# Patient Record
Sex: Female | Born: 1935 | Race: White | Hispanic: No | Smoking: Never smoker
Health system: Southern US, Community
[De-identification: ages and names within clinical notes are randomized; demographics above are authoritative.]

## PROBLEM LIST (undated history)

## (undated) DIAGNOSIS — E785 Hyperlipidemia, unspecified: Secondary | ICD-10-CM

## (undated) DIAGNOSIS — M79609 Pain in unspecified limb: Secondary | ICD-10-CM

## (undated) DIAGNOSIS — N766 Ulceration of vulva: Secondary | ICD-10-CM

## (undated) DIAGNOSIS — K648 Other hemorrhoids: Secondary | ICD-10-CM

## (undated) DIAGNOSIS — M1990 Unspecified osteoarthritis, unspecified site: Secondary | ICD-10-CM

## (undated) DIAGNOSIS — R609 Edema, unspecified: Secondary | ICD-10-CM

## (undated) DIAGNOSIS — M810 Age-related osteoporosis without current pathological fracture: Secondary | ICD-10-CM

## (undated) DIAGNOSIS — J309 Allergic rhinitis, unspecified: Secondary | ICD-10-CM

## (undated) DIAGNOSIS — Z1231 Encounter for screening mammogram for malignant neoplasm of breast: Secondary | ICD-10-CM

## (undated) DIAGNOSIS — Z85038 Personal history of other malignant neoplasm of large intestine: Secondary | ICD-10-CM

## (undated) DIAGNOSIS — Z13820 Encounter for screening for osteoporosis: Secondary | ICD-10-CM

## (undated) DIAGNOSIS — C20 Malignant neoplasm of rectum: Secondary | ICD-10-CM

## (undated) DIAGNOSIS — F4389 Other reactions to severe stress: Secondary | ICD-10-CM

## (undated) DIAGNOSIS — I10 Essential (primary) hypertension: Secondary | ICD-10-CM

## (undated) DIAGNOSIS — F438 Other reactions to severe stress: Secondary | ICD-10-CM

## (undated) DIAGNOSIS — R197 Diarrhea, unspecified: Secondary | ICD-10-CM

## (undated) HISTORY — DX: Personal history of other malignant neoplasm of large intestine: Z85.038

## (undated) HISTORY — DX: Age-related osteoporosis without current pathological fracture: M81.0

## (undated) HISTORY — DX: Diarrhea, unspecified: R19.7

## (undated) HISTORY — DX: Encounter for screening for osteoporosis: Z13.820

## (undated) HISTORY — DX: Ulceration of vulva: N76.6

## (undated) HISTORY — DX: Pain in unspecified limb: M79.609

## (undated) HISTORY — DX: Unspecified osteoarthritis, unspecified site: M19.90

## (undated) HISTORY — DX: Other reactions to severe stress: F43.89

## (undated) HISTORY — DX: Essential (primary) hypertension: I10

## (undated) HISTORY — DX: Allergic rhinitis, unspecified: J30.9

## (undated) HISTORY — DX: Hyperlipidemia, unspecified: E78.5

## (undated) HISTORY — DX: Edema, unspecified: R60.9

## (undated) HISTORY — DX: Other reactions to severe stress: F43.8

## (undated) HISTORY — DX: Other hemorrhoids: K64.8

## (undated) HISTORY — DX: Malignant neoplasm of rectum: C20

## (undated) HISTORY — DX: Encounter for screening mammogram for malignant neoplasm of breast: Z12.31

## (undated) HISTORY — PX: OTHER SURGICAL HISTORY: SHX169

---

## 1999-01-08 ENCOUNTER — Other Ambulatory Visit: Admission: RE | Admit: 1999-01-08 | Discharge: 1999-01-08 | Payer: Self-pay | Admitting: Family Medicine

## 2001-12-28 ENCOUNTER — Other Ambulatory Visit: Admission: RE | Admit: 2001-12-28 | Discharge: 2001-12-28 | Payer: Self-pay | Admitting: Family Medicine

## 2002-01-10 ENCOUNTER — Encounter: Payer: Self-pay | Admitting: Family Medicine

## 2002-01-10 ENCOUNTER — Observation Stay (HOSPITAL_COMMUNITY): Admission: AD | Admit: 2002-01-10 | Discharge: 2002-01-11 | Payer: Self-pay | Admitting: Family Medicine

## 2002-01-11 ENCOUNTER — Encounter: Payer: Self-pay | Admitting: Family Medicine

## 2002-01-18 ENCOUNTER — Encounter: Payer: Self-pay | Admitting: Family Medicine

## 2002-01-18 ENCOUNTER — Encounter (INDEPENDENT_AMBULATORY_CARE_PROVIDER_SITE_OTHER): Payer: Self-pay | Admitting: *Deleted

## 2002-01-18 ENCOUNTER — Encounter: Admission: RE | Admit: 2002-01-18 | Discharge: 2002-01-18 | Payer: Self-pay | Admitting: Family Medicine

## 2002-03-02 ENCOUNTER — Encounter: Payer: Self-pay | Admitting: Internal Medicine

## 2003-03-20 ENCOUNTER — Encounter: Payer: Self-pay | Admitting: Family Medicine

## 2004-10-25 ENCOUNTER — Encounter: Payer: Self-pay | Admitting: Family Medicine

## 2005-04-23 ENCOUNTER — Ambulatory Visit: Payer: Self-pay | Admitting: Family Medicine

## 2005-06-23 ENCOUNTER — Ambulatory Visit: Payer: Self-pay | Admitting: Family Medicine

## 2005-06-23 ENCOUNTER — Other Ambulatory Visit: Admission: RE | Admit: 2005-06-23 | Discharge: 2005-06-23 | Payer: Self-pay | Admitting: Family Medicine

## 2005-11-01 ENCOUNTER — Ambulatory Visit: Payer: Self-pay | Admitting: Family Medicine

## 2006-04-25 ENCOUNTER — Ambulatory Visit: Payer: Self-pay | Admitting: Family Medicine

## 2006-09-08 ENCOUNTER — Ambulatory Visit: Payer: Self-pay | Admitting: Family Medicine

## 2006-10-28 LAB — FECAL OCCULT BLOOD, GUAIAC: Fecal Occult Blood: NEGATIVE

## 2006-11-09 ENCOUNTER — Ambulatory Visit: Payer: Self-pay | Admitting: Family Medicine

## 2006-11-16 ENCOUNTER — Ambulatory Visit: Payer: Self-pay | Admitting: Family Medicine

## 2006-11-16 LAB — CONVERTED CEMR LAB
AST: 19 units/L (ref 0–37)
Alkaline Phosphatase: 64 units/L (ref 39–117)
BUN: 16 mg/dL (ref 6–23)
Cholesterol: 205 mg/dL (ref 0–200)
Creatinine, Ser: 0.7 mg/dL (ref 0.4–1.2)
GFR calc Af Amer: 106 mL/min
GFR calc non Af Amer: 88 mL/min
Glucose, Bld: 100 mg/dL — ABNORMAL HIGH (ref 70–99)
Sodium: 141 meq/L (ref 135–145)
Total Bilirubin: 0.8 mg/dL (ref 0.3–1.2)
Total CHOL/HDL Ratio: 2.7
Triglycerides: 42 mg/dL (ref 0–149)
VLDL: 8 mg/dL (ref 0–40)

## 2006-11-29 ENCOUNTER — Ambulatory Visit: Payer: Self-pay | Admitting: Family Medicine

## 2007-05-30 ENCOUNTER — Encounter: Payer: Self-pay | Admitting: Family Medicine

## 2007-05-30 DIAGNOSIS — M170 Bilateral primary osteoarthritis of knee: Secondary | ICD-10-CM | POA: Insufficient documentation

## 2007-05-30 DIAGNOSIS — Z9189 Other specified personal risk factors, not elsewhere classified: Secondary | ICD-10-CM | POA: Insufficient documentation

## 2007-05-30 DIAGNOSIS — M1990 Unspecified osteoarthritis, unspecified site: Secondary | ICD-10-CM | POA: Insufficient documentation

## 2007-05-30 DIAGNOSIS — E785 Hyperlipidemia, unspecified: Secondary | ICD-10-CM | POA: Insufficient documentation

## 2007-05-30 DIAGNOSIS — I10 Essential (primary) hypertension: Secondary | ICD-10-CM | POA: Insufficient documentation

## 2007-05-31 ENCOUNTER — Ambulatory Visit: Payer: Self-pay | Admitting: Family Medicine

## 2007-05-31 DIAGNOSIS — F438 Other reactions to severe stress: Secondary | ICD-10-CM | POA: Insufficient documentation

## 2007-10-09 ENCOUNTER — Ambulatory Visit: Payer: Self-pay | Admitting: Family Medicine

## 2007-11-20 ENCOUNTER — Ambulatory Visit: Payer: Self-pay | Admitting: Family Medicine

## 2007-11-22 ENCOUNTER — Telehealth: Payer: Self-pay | Admitting: Family Medicine

## 2007-11-22 ENCOUNTER — Ambulatory Visit: Payer: Self-pay | Admitting: Family Medicine

## 2007-11-24 LAB — CONVERTED CEMR LAB
ALT: 16 units/L (ref 0–35)
AST: 29 units/L (ref 0–37)
Albumin: 4 g/dL (ref 3.5–5.2)
Alkaline Phosphatase: 57 units/L (ref 39–117)
Bilirubin, Direct: 0.5 mg/dL — ABNORMAL HIGH (ref 0.0–0.3)
CO2: 32 meq/L (ref 19–32)
Calcium: 10 mg/dL (ref 8.4–10.5)
Cholesterol: 195 mg/dL (ref 0–200)
Glucose, Bld: 102 mg/dL — ABNORMAL HIGH (ref 70–99)
HDL: 66.5 mg/dL (ref >39.0)
Potassium: 5.1 meq/L (ref 3.5–5.1)
Sodium: 141 meq/L (ref 135–145)
Total Bilirubin: 1.4 mg/dL — ABNORMAL HIGH (ref 0.3–1.2)
Triglycerides: 40 mg/dL (ref 0–149)

## 2008-01-23 ENCOUNTER — Ambulatory Visit: Payer: Self-pay | Admitting: Family Medicine

## 2008-01-24 ENCOUNTER — Encounter (INDEPENDENT_AMBULATORY_CARE_PROVIDER_SITE_OTHER): Payer: Self-pay | Admitting: *Deleted

## 2008-04-04 ENCOUNTER — Telehealth: Payer: Self-pay | Admitting: Family Medicine

## 2008-07-08 ENCOUNTER — Telehealth: Payer: Self-pay | Admitting: Family Medicine

## 2008-11-20 ENCOUNTER — Ambulatory Visit: Payer: Self-pay | Admitting: Family Medicine

## 2008-11-20 DIAGNOSIS — K648 Other hemorrhoids: Secondary | ICD-10-CM | POA: Insufficient documentation

## 2008-12-04 ENCOUNTER — Ambulatory Visit: Payer: Self-pay | Admitting: Internal Medicine

## 2008-12-20 ENCOUNTER — Ambulatory Visit: Payer: Self-pay | Admitting: Family Medicine

## 2008-12-24 LAB — CONVERTED CEMR LAB
ALT: 19 units/L (ref 0–35)
Albumin: 3.9 g/dL (ref 3.5–5.2)
CO2: 31 meq/L (ref 19–32)
Calcium: 9.9 mg/dL (ref 8.4–10.5)
Chloride: 101 meq/L (ref 96–112)
Cholesterol: 200 mg/dL (ref 0–200)
Creatinine, Ser: 0.7 mg/dL (ref 0.4–1.2)
Eosinophils Absolute: 0.2 10*3/uL (ref 0.0–0.7)
GFR calc Af Amer: 105 mL/min
GFR calc non Af Amer: 87 mL/min
Glucose, Bld: 99 mg/dL (ref 70–99)
HCT: 41.6 % (ref 36.0–46.0)
Hemoglobin: 14.7 g/dL (ref 12.0–15.0)
LDL Cholesterol: 114 mg/dL — ABNORMAL HIGH (ref 0–99)
Lymphocytes Relative: 41.9 % (ref 12.0–46.0)
MCHC: 35.4 g/dL (ref 30.0–36.0)
Potassium: 3.9 meq/L (ref 3.5–5.1)
Sodium: 138 meq/L (ref 135–145)
Total Bilirubin: 0.9 mg/dL (ref 0.3–1.2)
Total CHOL/HDL Ratio: 2.8
Total Protein: 6.7 g/dL (ref 6.0–8.3)
Triglycerides: 73 mg/dL (ref 0–149)

## 2008-12-25 ENCOUNTER — Ambulatory Visit: Payer: Self-pay | Admitting: Family Medicine

## 2008-12-25 LAB — CONVERTED CEMR LAB

## 2009-01-01 ENCOUNTER — Telehealth: Payer: Self-pay | Admitting: Internal Medicine

## 2009-01-02 ENCOUNTER — Ambulatory Visit: Payer: Self-pay | Admitting: Internal Medicine

## 2009-01-02 ENCOUNTER — Encounter: Payer: Self-pay | Admitting: Internal Medicine

## 2009-01-02 ENCOUNTER — Telehealth: Payer: Self-pay | Admitting: Internal Medicine

## 2009-01-02 LAB — CONVERTED CEMR LAB
ALT: 19 units/L (ref 0–35)
Albumin: 3.6 g/dL (ref 3.5–5.2)
Alkaline Phosphatase: 71 units/L (ref 39–117)
Basophils Absolute: 0 10*3/uL (ref 0.0–0.1)
CO2: 31 meq/L (ref 19–32)
Calcium: 9 mg/dL (ref 8.4–10.5)
Creatinine, Ser: 0.5 mg/dL (ref 0.4–1.2)
Eosinophils Relative: 5.6 % — ABNORMAL HIGH (ref 0.0–5.0)
GFR calc Af Amer: 156 mL/min
GFR calc non Af Amer: 129 mL/min
Iron: 148 ug/dL — ABNORMAL HIGH (ref 42–145)
Lymphocytes Relative: 36.1 % (ref 12.0–46.0)
MCHC: 33.5 g/dL (ref 30.0–36.0)
MCV: 93.1 fL (ref 78.0–100.0)
Platelets: 212 10*3/uL (ref 150–400)
RBC: 4.45 M/uL (ref 3.87–5.11)
RDW: 12.6 % (ref 11.5–14.6)
Saturation Ratios: 51.3 % — ABNORMAL HIGH (ref 20.0–50.0)
Total Protein: 6 g/dL (ref 6.0–8.3)
Transferrin: 206.1 mg/dL — ABNORMAL LOW (ref 212.0–360.0)
WBC: 4.9 10*3/uL (ref 4.5–10.5)

## 2009-01-03 ENCOUNTER — Ambulatory Visit: Payer: Self-pay | Admitting: Cardiovascular Disease

## 2009-01-04 ENCOUNTER — Encounter: Payer: Self-pay | Admitting: Internal Medicine

## 2009-01-07 ENCOUNTER — Ambulatory Visit: Payer: Self-pay | Admitting: Internal Medicine

## 2009-01-10 ENCOUNTER — Encounter: Payer: Self-pay | Admitting: Family Medicine

## 2009-01-10 ENCOUNTER — Encounter: Payer: Self-pay | Admitting: Gastroenterology

## 2009-01-10 ENCOUNTER — Encounter: Payer: Self-pay | Admitting: Internal Medicine

## 2009-01-13 ENCOUNTER — Telehealth: Payer: Self-pay | Admitting: Gastroenterology

## 2009-01-13 ENCOUNTER — Telehealth: Payer: Self-pay | Admitting: Internal Medicine

## 2009-01-14 ENCOUNTER — Encounter: Payer: Self-pay | Admitting: Gastroenterology

## 2009-01-14 ENCOUNTER — Telehealth: Payer: Self-pay | Admitting: Gastroenterology

## 2009-01-15 ENCOUNTER — Ambulatory Visit: Payer: Self-pay | Admitting: Gastroenterology

## 2009-01-16 ENCOUNTER — Ambulatory Visit: Payer: Self-pay | Admitting: Family Medicine

## 2009-01-16 DIAGNOSIS — J309 Allergic rhinitis, unspecified: Secondary | ICD-10-CM | POA: Insufficient documentation

## 2009-01-21 ENCOUNTER — Ambulatory Visit (HOSPITAL_COMMUNITY): Admission: RE | Admit: 2009-01-21 | Discharge: 2009-01-21 | Payer: Self-pay | Admitting: Gastroenterology

## 2009-01-21 ENCOUNTER — Ambulatory Visit: Payer: Self-pay | Admitting: Gastroenterology

## 2009-01-22 ENCOUNTER — Ambulatory Visit: Payer: Self-pay | Admitting: Oncology

## 2009-01-23 ENCOUNTER — Telehealth (INDEPENDENT_AMBULATORY_CARE_PROVIDER_SITE_OTHER): Payer: Self-pay | Admitting: *Deleted

## 2009-01-30 ENCOUNTER — Encounter: Payer: Self-pay | Admitting: Gastroenterology

## 2009-01-30 ENCOUNTER — Ambulatory Visit: Admission: RE | Admit: 2009-01-30 | Discharge: 2009-04-30 | Payer: Self-pay | Admitting: Radiation Oncology

## 2009-02-05 ENCOUNTER — Encounter: Payer: Self-pay | Admitting: Family Medicine

## 2009-02-07 ENCOUNTER — Encounter: Payer: Self-pay | Admitting: Internal Medicine

## 2009-02-21 ENCOUNTER — Telehealth: Payer: Self-pay | Admitting: Family Medicine

## 2009-02-27 LAB — CBC WITH DIFFERENTIAL/PLATELET
BASO%: 0.6 % (ref 0.0–2.0)
Eosinophils Absolute: 0.6 10*3/uL — ABNORMAL HIGH (ref 0.0–0.5)
MCHC: 35.3 g/dL (ref 31.5–36.0)
MONO#: 0.5 10*3/uL (ref 0.1–0.9)
NEUT#: 2.7 10*3/uL (ref 1.5–6.5)
Platelets: 176 10*3/uL (ref 145–400)
RBC: 4.54 10*6/uL (ref 3.70–5.45)
RDW: 13 % (ref 11.2–14.5)
WBC: 4.7 10*3/uL (ref 3.9–10.3)
lymph#: 0.8 10*3/uL — ABNORMAL LOW (ref 0.9–3.3)

## 2009-03-07 ENCOUNTER — Ambulatory Visit: Payer: Self-pay | Admitting: Oncology

## 2009-03-10 LAB — CBC WITH DIFFERENTIAL/PLATELET
BASO%: 0.2 % (ref 0.0–2.0)
Eosinophils Absolute: 0.5 10*3/uL (ref 0.0–0.5)
HCT: 43.2 % (ref 34.8–46.6)
LYMPH%: 13.9 % — ABNORMAL LOW (ref 14.0–49.7)
MONO#: 0.8 10*3/uL (ref 0.1–0.9)
NEUT#: 1.9 10*3/uL (ref 1.5–6.5)
NEUT%: 50.8 % (ref 38.4–76.8)
Platelets: 232 10*3/uL (ref 145–400)
WBC: 3.7 10*3/uL — ABNORMAL LOW (ref 3.9–10.3)
lymph#: 0.5 10*3/uL — ABNORMAL LOW (ref 0.9–3.3)

## 2009-03-10 LAB — COMPREHENSIVE METABOLIC PANEL
ALT: 45 U/L — ABNORMAL HIGH (ref 0–35)
CO2: 35 mEq/L — ABNORMAL HIGH (ref 19–32)
Calcium: 9.2 mg/dL (ref 8.4–10.5)
Chloride: 97 mEq/L (ref 96–112)
Creatinine, Ser: 0.66 mg/dL (ref 0.40–1.20)
Glucose, Bld: 129 mg/dL — ABNORMAL HIGH (ref 70–99)
Sodium: 138 mEq/L (ref 135–145)
Total Bilirubin: 1.1 mg/dL (ref 0.3–1.2)
Total Protein: 5.2 g/dL — ABNORMAL LOW (ref 6.0–8.3)

## 2009-03-12 ENCOUNTER — Encounter: Payer: Self-pay | Admitting: Family Medicine

## 2009-03-21 ENCOUNTER — Encounter: Payer: Self-pay | Admitting: Family Medicine

## 2009-03-28 ENCOUNTER — Encounter: Payer: Self-pay | Admitting: Family Medicine

## 2009-03-28 LAB — CBC WITH DIFFERENTIAL/PLATELET
Basophils Absolute: 0 10*3/uL (ref 0.0–0.1)
EOS%: 0.5 % (ref 0.0–7.0)
Eosinophils Absolute: 0.1 10*3/uL (ref 0.0–0.5)
HCT: 45.8 % (ref 34.8–46.6)
HGB: 15.7 g/dL (ref 11.6–15.9)
MCH: 32.3 pg (ref 25.1–34.0)
MCV: 94.3 fL (ref 79.5–101.0)
NEUT%: 86.7 % — ABNORMAL HIGH (ref 38.4–76.8)
lymph#: 0.4 10*3/uL — ABNORMAL LOW (ref 0.9–3.3)

## 2009-03-28 LAB — BASIC METABOLIC PANEL
BUN: 20 mg/dL (ref 6–23)
Chloride: 105 mEq/L (ref 96–112)
Creatinine, Ser: 0.72 mg/dL (ref 0.40–1.20)
Glucose, Bld: 108 mg/dL — ABNORMAL HIGH (ref 70–99)

## 2009-04-05 ENCOUNTER — Emergency Department (HOSPITAL_COMMUNITY): Admission: EM | Admit: 2009-04-05 | Discharge: 2009-04-05 | Payer: Self-pay | Admitting: Emergency Medicine

## 2009-04-11 ENCOUNTER — Ambulatory Visit: Payer: Self-pay | Admitting: Internal Medicine

## 2009-04-11 ENCOUNTER — Ambulatory Visit: Payer: Self-pay | Admitting: Oncology

## 2009-04-11 ENCOUNTER — Inpatient Hospital Stay (HOSPITAL_COMMUNITY): Admission: AD | Admit: 2009-04-11 | Discharge: 2009-04-17 | Payer: Self-pay | Admitting: Oncology

## 2009-04-15 ENCOUNTER — Encounter: Payer: Self-pay | Admitting: Oncology

## 2009-04-16 ENCOUNTER — Encounter: Payer: Self-pay | Admitting: Internal Medicine

## 2009-04-17 ENCOUNTER — Encounter: Payer: Self-pay | Admitting: Internal Medicine

## 2009-04-17 ENCOUNTER — Ambulatory Visit: Payer: Self-pay | Admitting: Surgery

## 2009-04-17 ENCOUNTER — Encounter (INDEPENDENT_AMBULATORY_CARE_PROVIDER_SITE_OTHER): Payer: Self-pay | Admitting: *Deleted

## 2009-04-21 ENCOUNTER — Ambulatory Visit: Payer: Self-pay | Admitting: Oncology

## 2009-04-21 ENCOUNTER — Encounter: Payer: Self-pay | Admitting: Family Medicine

## 2009-04-21 ENCOUNTER — Encounter: Payer: Self-pay | Admitting: Internal Medicine

## 2009-04-21 LAB — BASIC METABOLIC PANEL
BUN: 10 mg/dL (ref 6–23)
Calcium: 9.2 mg/dL (ref 8.4–10.5)
Creatinine, Ser: 0.56 mg/dL (ref 0.40–1.20)
Potassium: 3.8 mEq/L (ref 3.5–5.3)

## 2009-04-22 ENCOUNTER — Encounter: Payer: Self-pay | Admitting: Family Medicine

## 2009-05-05 ENCOUNTER — Ambulatory Visit: Payer: Self-pay | Admitting: Family Medicine

## 2009-05-09 ENCOUNTER — Encounter: Payer: Self-pay | Admitting: Internal Medicine

## 2009-05-13 ENCOUNTER — Encounter: Payer: Self-pay | Admitting: Family Medicine

## 2009-05-16 ENCOUNTER — Encounter: Payer: Self-pay | Admitting: Family Medicine

## 2009-05-19 ENCOUNTER — Encounter: Payer: Self-pay | Admitting: Family Medicine

## 2009-05-21 ENCOUNTER — Telehealth: Payer: Self-pay | Admitting: Family Medicine

## 2009-06-12 ENCOUNTER — Ambulatory Visit: Payer: Self-pay | Admitting: Oncology

## 2009-06-12 ENCOUNTER — Telehealth: Payer: Self-pay | Admitting: Internal Medicine

## 2009-06-12 ENCOUNTER — Telehealth: Payer: Self-pay | Admitting: Family Medicine

## 2009-06-23 ENCOUNTER — Encounter: Payer: Self-pay | Admitting: Internal Medicine

## 2009-06-25 ENCOUNTER — Encounter: Payer: Self-pay | Admitting: Internal Medicine

## 2009-06-25 HISTORY — PX: PARTIAL COLECTOMY: SHX5273

## 2009-06-26 ENCOUNTER — Encounter: Payer: Self-pay | Admitting: Internal Medicine

## 2009-07-04 ENCOUNTER — Encounter: Payer: Self-pay | Admitting: Internal Medicine

## 2009-07-11 ENCOUNTER — Encounter: Payer: Self-pay | Admitting: Family Medicine

## 2009-07-11 ENCOUNTER — Encounter: Payer: Self-pay | Admitting: Internal Medicine

## 2009-07-17 ENCOUNTER — Telehealth: Payer: Self-pay | Admitting: Family Medicine

## 2009-07-18 ENCOUNTER — Telehealth (INDEPENDENT_AMBULATORY_CARE_PROVIDER_SITE_OTHER): Payer: Self-pay | Admitting: *Deleted

## 2009-07-23 DIAGNOSIS — C20 Malignant neoplasm of rectum: Secondary | ICD-10-CM | POA: Insufficient documentation

## 2009-07-23 DIAGNOSIS — Z85048 Personal history of other malignant neoplasm of rectum, rectosigmoid junction, and anus: Secondary | ICD-10-CM | POA: Insufficient documentation

## 2009-08-13 ENCOUNTER — Encounter: Payer: Self-pay | Admitting: Internal Medicine

## 2009-08-15 ENCOUNTER — Encounter: Payer: Self-pay | Admitting: Internal Medicine

## 2009-08-18 ENCOUNTER — Telehealth: Payer: Self-pay | Admitting: Family Medicine

## 2009-09-09 ENCOUNTER — Encounter: Payer: Self-pay | Admitting: Internal Medicine

## 2009-09-10 ENCOUNTER — Encounter: Payer: Self-pay | Admitting: Internal Medicine

## 2009-09-11 ENCOUNTER — Encounter: Payer: Self-pay | Admitting: Internal Medicine

## 2009-09-20 ENCOUNTER — Telehealth: Payer: Self-pay | Admitting: Family Medicine

## 2009-09-25 ENCOUNTER — Ambulatory Visit: Payer: Self-pay | Admitting: Family Medicine

## 2009-09-25 LAB — CONVERTED CEMR LAB
Bilirubin Urine: NEGATIVE
Blood in Urine, dipstick: NEGATIVE
Protein, U semiquant: NEGATIVE
Urobilinogen, UA: 0.2
WBC Urine, dipstick: NEGATIVE

## 2009-10-07 ENCOUNTER — Telehealth: Payer: Self-pay | Admitting: Family Medicine

## 2009-10-08 ENCOUNTER — Ambulatory Visit: Payer: Self-pay | Admitting: Family Medicine

## 2009-10-08 ENCOUNTER — Encounter: Payer: Self-pay | Admitting: Family Medicine

## 2009-10-08 LAB — CONVERTED CEMR LAB
Bilirubin Urine: NEGATIVE
Ketones, urine, test strip: NEGATIVE
Nitrite: POSITIVE
Protein, U semiquant: NEGATIVE
Specific Gravity, Urine: 1.01

## 2009-10-22 ENCOUNTER — Ambulatory Visit: Payer: Self-pay | Admitting: Family Medicine

## 2009-10-22 LAB — CONVERTED CEMR LAB
Bilirubin Urine: NEGATIVE
Blood in Urine, dipstick: NEGATIVE
Glucose, Urine, Semiquant: NEGATIVE
Nitrite: NEGATIVE
Protein, U semiquant: NEGATIVE
WBC Urine, dipstick: NEGATIVE
pH: 6

## 2009-10-23 ENCOUNTER — Telehealth: Payer: Self-pay | Admitting: Family Medicine

## 2009-10-27 ENCOUNTER — Telehealth: Payer: Self-pay | Admitting: Family Medicine

## 2009-10-27 DIAGNOSIS — K529 Noninfective gastroenteritis and colitis, unspecified: Secondary | ICD-10-CM | POA: Insufficient documentation

## 2009-10-28 ENCOUNTER — Telehealth: Payer: Self-pay | Admitting: Family Medicine

## 2009-11-11 ENCOUNTER — Encounter: Payer: Self-pay | Admitting: Internal Medicine

## 2009-11-11 ENCOUNTER — Encounter: Payer: Self-pay | Admitting: Family Medicine

## 2009-11-14 ENCOUNTER — Encounter: Payer: Self-pay | Admitting: Internal Medicine

## 2009-11-18 ENCOUNTER — Telehealth: Payer: Self-pay | Admitting: Family Medicine

## 2009-11-21 ENCOUNTER — Ambulatory Visit: Payer: Self-pay | Admitting: Family Medicine

## 2009-11-21 LAB — CONVERTED CEMR LAB
Bilirubin Urine: NEGATIVE
Nitrite: NEGATIVE
WBC Urine, dipstick: NEGATIVE
pH: 6

## 2009-12-02 ENCOUNTER — Encounter: Admission: RE | Admit: 2009-12-02 | Discharge: 2009-12-02 | Payer: Self-pay | Admitting: Family Medicine

## 2009-12-09 ENCOUNTER — Ambulatory Visit: Payer: Self-pay | Admitting: Obstetrics and Gynecology

## 2009-12-17 ENCOUNTER — Encounter (INDEPENDENT_AMBULATORY_CARE_PROVIDER_SITE_OTHER): Payer: Self-pay | Admitting: *Deleted

## 2009-12-23 ENCOUNTER — Emergency Department (HOSPITAL_COMMUNITY): Admission: EM | Admit: 2009-12-23 | Discharge: 2009-12-24 | Payer: Self-pay | Admitting: Emergency Medicine

## 2009-12-24 ENCOUNTER — Telehealth (INDEPENDENT_AMBULATORY_CARE_PROVIDER_SITE_OTHER): Payer: Self-pay | Admitting: *Deleted

## 2009-12-24 ENCOUNTER — Encounter: Payer: Self-pay | Admitting: Internal Medicine

## 2009-12-24 ENCOUNTER — Encounter (INDEPENDENT_AMBULATORY_CARE_PROVIDER_SITE_OTHER): Payer: Self-pay | Admitting: *Deleted

## 2009-12-26 ENCOUNTER — Encounter: Payer: Self-pay | Admitting: Internal Medicine

## 2009-12-28 ENCOUNTER — Encounter: Payer: Self-pay | Admitting: Internal Medicine

## 2010-01-01 ENCOUNTER — Encounter (INDEPENDENT_AMBULATORY_CARE_PROVIDER_SITE_OTHER): Payer: Self-pay | Admitting: *Deleted

## 2010-01-06 ENCOUNTER — Ambulatory Visit: Payer: Self-pay | Admitting: Family Medicine

## 2010-01-07 ENCOUNTER — Encounter (INDEPENDENT_AMBULATORY_CARE_PROVIDER_SITE_OTHER): Payer: Self-pay | Admitting: *Deleted

## 2010-01-08 ENCOUNTER — Ambulatory Visit: Payer: Self-pay | Admitting: Internal Medicine

## 2010-01-14 ENCOUNTER — Ambulatory Visit: Payer: Self-pay | Admitting: Family Medicine

## 2010-01-15 ENCOUNTER — Telehealth: Payer: Self-pay | Admitting: Internal Medicine

## 2010-01-16 LAB — CONVERTED CEMR LAB
ALT: 17 units/L (ref 0–35)
CO2: 33 meq/L — ABNORMAL HIGH (ref 19–32)
Chloride: 105 meq/L (ref 96–112)
Cholesterol: 180 mg/dL (ref 0–200)
Creatinine, Ser: 0.8 mg/dL (ref 0.4–1.2)
GFR calc non Af Amer: 74.36 mL/min (ref >60)
LDL Cholesterol: 89 mg/dL (ref 0–99)
Potassium: 4 meq/L (ref 3.5–5.1)
Sodium: 142 meq/L (ref 135–145)
Total Bilirubin: 0.7 mg/dL (ref 0.3–1.2)
Total CHOL/HDL Ratio: 2
Total Protein: 6.6 g/dL (ref 6.0–8.3)
Triglycerides: 58 mg/dL (ref 0.0–149.0)

## 2010-01-26 ENCOUNTER — Encounter: Payer: Self-pay | Admitting: Family Medicine

## 2010-01-30 LAB — HM MAMMOGRAPHY: HM Mammogram: NORMAL

## 2010-02-17 ENCOUNTER — Encounter: Payer: Self-pay | Admitting: Family Medicine

## 2010-02-17 ENCOUNTER — Encounter: Payer: Self-pay | Admitting: Internal Medicine

## 2010-03-13 ENCOUNTER — Ambulatory Visit: Payer: Self-pay | Admitting: Family Medicine

## 2010-03-24 ENCOUNTER — Telehealth: Payer: Self-pay | Admitting: Family Medicine

## 2010-05-18 ENCOUNTER — Encounter: Payer: Self-pay | Admitting: Internal Medicine

## 2010-05-19 ENCOUNTER — Encounter: Payer: Self-pay | Admitting: Internal Medicine

## 2010-05-19 ENCOUNTER — Encounter: Payer: Self-pay | Admitting: Family Medicine

## 2010-05-23 ENCOUNTER — Encounter: Payer: Self-pay | Admitting: Family Medicine

## 2010-06-10 ENCOUNTER — Encounter: Payer: Self-pay | Admitting: Family Medicine

## 2010-06-15 ENCOUNTER — Encounter: Payer: Self-pay | Admitting: Family Medicine

## 2010-06-15 ENCOUNTER — Ambulatory Visit: Payer: Self-pay | Admitting: Family Medicine

## 2010-06-15 ENCOUNTER — Telehealth: Payer: Self-pay | Admitting: Family Medicine

## 2010-06-15 DIAGNOSIS — R300 Dysuria: Secondary | ICD-10-CM | POA: Insufficient documentation

## 2010-06-15 LAB — CONVERTED CEMR LAB
Bilirubin Urine: NEGATIVE
Glucose, Urine, Semiquant: NEGATIVE
Ketones, urine, test strip: NEGATIVE
Protein, U semiquant: NEGATIVE
Urobilinogen, UA: 0.2

## 2010-07-20 ENCOUNTER — Ambulatory Visit: Payer: Self-pay | Admitting: Internal Medicine

## 2010-07-20 DIAGNOSIS — M79609 Pain in unspecified limb: Secondary | ICD-10-CM | POA: Insufficient documentation

## 2010-09-04 ENCOUNTER — Encounter: Payer: Self-pay | Admitting: Family Medicine

## 2010-11-15 ENCOUNTER — Encounter: Payer: Self-pay | Admitting: General Surgery

## 2010-11-26 NOTE — Letter (Signed)
Summary: [facility]  Consult/[facility]   Imported By: Sherian Rein 05/25/2010 07:44:57  _____________________________________________________________________  External Attachment:    Type:   Image     Comment:   External Document

## 2010-11-26 NOTE — Letter (Signed)
Summary: [facility] Triage Call Report  [facility] Triage Call Report   Imported By: Beau Fanny 05/26/2010 10:43:04  _____________________________________________________________________  External Attachment:    Type:   Image     Comment:   External Document

## 2010-11-26 NOTE — Discharge Summary (Signed)
Summary: Rectal Cancer  NAME:  [name], [name]                ACCOUNT NO.:  [id number]      MEDICAL RECORD NO.:  [id number]          PATIENT TYPE:  INP      LOCATION:  1402                         FACILITY:  [facility]      PHYSICIAN:  Leighton Roach. Truett Perna, M.D. DATE OF BIRTH:  1933-07-10      DATE OF ADMISSION:  04/11/2009   DATE OF DISCHARGE:  04/17/2009                                  DISCHARGE SUMMARY      DISCHARGE DIAGNOSES:   1. Rectal cancer, status post neoadjuvant chemo and radiation therapy.   2. Diarrhea secondary to chemotherapy and radiation therapy.   3. Tachycardia, asymptomatic, question secondary to intravascular       depletion.   4. Lower extremity edema/anasarca likely secondary to hypoalbuminemia.   5. Hypokalemia, likely secondary to diarrhea and diuretic therapy.   6. History of hypertension.      CONSULTATIONS:  Dr. Berton Mount, Cardiology.      PROCEDURES:   1. A 2D echo, April 15, 2009 - Left ventricle cavity size was normal.       There was mild concentric hypertrophy.  Systolic function was       vigorous.  Estimated ejection fraction was in the range of 65% to       70%.  Wall motion was normal.  There were no regional wall motion       abnormalities.   2. CT angiography chest with contrast, April 15, 2009 - No evidence of       acute pulmonary embolism.  Small pleural effusions.  Osteopenia.       Partially compressed mid thoracic vertebral body, probably T7, of       uncertain age.  Suspect fatty infiltration of the liver.   3. Bilateral lower extremity venous Dopplers, April 17, 2009 - No       evidence of deep vein or superficial thrombosis involving the right       lower extremity or left lower extremity.  No evidence of Baker's       cyst on the right or left.      HPI:  [name] is a 77 year old woman with a recent diagnosis of rectal   cancer.  She was diagnosed with rectal cancer in March of this year.   Colonoscopy on January 02, 2009, confirmed a mass  in the rectum.  Biopsy   confirmed adenocarcinoma.  Endoscopic ultrasound on January 21, 2009,   revealed an ultrasound stage T3N0 tumor at 6 to 7 cm from the anal   verge.  [name] was treated with neoadjuvant chemotherapy and   radiation.  The chemotherapy consisted of Xeloda.  The Xeloda was   discontinued on Mar 16, 2009, due to diarrhea.  She completed the course   of radiation therapy Mar 21, 2009.  She was seen in the office on March 28, 2009, and was noted to be hypotensive and tachycardiac.  She received   IV fluids with improvement in the tachycardia.  She was evaluated in the  emergency department on April 05, 2009, with generalized weakness.  She   received IV fluids.  She was diagnosed with a urinary tract infection   (E. coli) and completed treatment with Cipro.      She was seen in the office on April 11, 2009 for followup.  At that time,   she complained of generalized weakness and progressive foot and ankle   edema over the past week.  She was again noted to be tachycardiac.  She   was admitted for further evaluation.      HOSPITAL COURSE:  [name] was admitted to a telemetry unit at [facility].  Admission labs showed hemoglobin 13.1, white count 5.2,   absolute neutrophil count 3.6, platelet count 186,000, sodium 139,   potassium 3.7, chloride 106, CO2 of 29, glucose 122, BUN 4, creatinine   0.52, bilirubin 0.7, alkaline phosphatase 66, SGOT 39, SGPT 33, total   protein 3.8, albumin 1.8, calcium 8.4, magnesium 1.9.  Urinalysis was   unremarkable.  Additional labs showed normal free T4 and TSH.   Intravenous hydration was initiated.  She was placed on Lovenox 40 mg   subcu daily.  She remained tachycardiac despite intravenous hydration.   Admission chest x-ray showed emphysema, subsegmental atelectasis at the   left lung base, possible small left pleural effusion.  A 2D echo was   done on April 15, 2009, with an estimated ejection fraction in the range   of  65% to 70%.  Wall motion was normal.  There were no regional wall   motion abnormalities.  Chest CT also done April 15, 2009, to rule out a   PE.  There was no evidence of acute pulmonary embolism.  Small pleural   effusions were noted.  She was also noted to be osteopenic with a   partially compressed mid thoracic vertebral body, probably T7, of   uncertain age.  The radiologist also suspected fatty infiltration of the   liver.      [name] had persistent tachycardia of unclear etiology.  A Cardiology   consultation was requested.  [name] was evaluated by Dr. Graciela Husbands.  No   additional cardiac evaluation was recommended.  Dr. Graciela Husbands recommended to   consider discontinuation of hydrochlorothiazide as he felt the edema was   likely related to hypoalbuminemia and the diuretic may have been   contributing to the tachycardia secondary to intravascular depletion.   Lower extremity Doppler studies were done to rule out DVT.  There was no   evidence of DVT bilaterally.      [name] continued to be tachycardiac throughout the hospitalization   with her heart rate ranging from the low 100s to the 120s.  She was   asymptomatic.  The tachycardia was felt to possibly be secondary to   relative intravascular depletion.      She appeared to be stable for discharge home on April 17, 2009.  She was   instructed to push fluids and call if she was experiencing diarrhea.      LABORATORY DATA:  April 17, 2009, sodium 140, potassium 3.2, chloride   106, CO2 of 30, glucose 87, BUN 5, creatinine 0.5, calcium 8.3.      DISPOSITION AT DISCHARGE:   1. Condition - Stable for discharge home.   2. Activity - Increase activity slowly.   3. Diet:  No restrictions.   4. Wound care:  Not applicable.   5. Special instructions:  Call  with increased diarrhea, shortness of       breath, chest pain, fever greater than or equal to 101 degrees.   6. Followup:  Dr. Truett Perna - Office will call with an appointment       time.       DISCHARGE MEDICATIONS:   1. Cartia XT 240 mg daily.   2. K-Dur 40 mEq on April 18, 2009, and then 20 mEq daily.   3. Lomotil as needed.   4. Compazine 10 mg every 6 hours as needed for nausea.   5. Vicodin 5/500 one tablet every 4 hours as needed for pain.   6. Advil 400 mg 3 times daily as needed.      She was instructed to discontinue hydrochlorothiazide.  This is the end   of dictation.               Lonna Cobb, N.P.               Leighton Roach. Truett Perna, M.D.   Electronically Signed         LT/MEDQ  D:  06/04/2009  T:  06/04/2009  Job:  [id number]

## 2010-11-26 NOTE — Assessment & Plan Note (Signed)
Summary: LEFT LEG PAIN/ 3:30   Vital Signs:  Patient profile:   77 year old female Weight:      124.25 pounds Temp:     98.1 degrees F oral Pulse rate:   78 / minute Pulse rhythm:   regular BP sitting:   126 / 84  (left arm) Cuff size:   regular  Vitals Entered By: Selena Batten Dance CMA Duncan Dull) (July 20, 2010 3:35 PM) CC: Left leg pain   History of Present Illness: CC: L leg pain  has trip 08/05/2010 [place]  Has family there.    Now noticing 7-8d h/o shinbone pain that started after standing on ladder while taking off wallpaper.  Has had ankle swelling in past, but now L leg swelling more than right.  Having shinbone pain that starts at ankle and radiates up (concentrated on left lateral leg).  Achey pain.  Denies trauma/injury.  In am swelling better.  Then throughout day swelling worsening.  No fevers/chills.  No redness of leg or warmth.  No recent prolonged immobility or SOB.  recently went through treatment for colon cancer (rad, chemo, surgery at [facility]).  Now doing well from that standpoint.  No smokers at home.  not on hormonal medicines.  Current Medications (verified): 1)  Cardizem Cd 240 Mg  Cp24 (Diltiazem Hcl Coated Beads) .Marland Kitchen.. 1 By Mouth Once Daily 2)  Hydrochlorothiazide 25 Mg  Tabs (Hydrochlorothiazide) .Marland Kitchen.. 1 By Mouth Once Daily 3)  Adult Aspirin Low Strength 81 Mg  Tbdp (Aspirin) .Marland Kitchen.. 1 By Mouth Once Daily 4)  Alprazolam 0.5 Mg Tbdp (Alprazolam) .... 1/2 Tab By Mouth Two Times A Day As Needed Anxiety 5)  Glucosamine 500 Mg Caps (Glucosamine Sulfate) .... Once Daily 6)  Mirtazapine 15 Mg Tabs (Mirtazapine) .Marland Kitchen.. 1 Tab By Mouth At Bedtime  Allergies (verified): No Known Drug Allergies  Past History:  Past Medical History: Last updated: 10/22/2009 colon cancer, status post surgery, chemotherapy HEMORRHOIDS, INTERNAL ANXIETY, SITUATIONAL PERIPHERAL EDEMA OSTEOPOROSIS OSTEOARTHRITIS HYPERTENSION  HYPERLIPIDEMIA  Past Surgical  History: Last updated: 07/23/2009 2004    DEXA -2.6 - osteoporosis             Cardiolite             Renal artery duplex  Dental Implants 2010 preop chemoradiation 06/2009 rectal cancer stage 3..partial colectomy, with temporary colostomy placed  Social History: Last updated: 12/25/2008 Never Smoked Alcohol use-no Drug use-no Regular exercise-yes, walks daily - 1 mile, recently joined [employer] Marital Status: Married x 50 years Children: 2 daughters healthy, 1 son died MVA Occupation: Retired, Estate manager/land agent Diet:  Veg  & fruit, (+) H2O, balanced diet  Review of Systems       per HPI  Physical Exam  General:  Well-developed,well-nourished,in no acute distress; alert,appropriate and cooperative throughout examination Lungs:  Normal respiratory effort, chest expands symmetrically. Lungs are clear to auscultation, no crackles or wheezes. Heart:  Normal rate and regular rhythm. S1 and S2 normal without gallop, murmur, click, rub or other extra sounds. Msk:  R calf circumference 33cm L calf circumference 33.5cm No pain on palpation of left calf, no palpable cords, negative homan sign.  + mild tenderness to palpation at medial and lateral distal lower leg, no pain with palpation of shin. No pain with movement at ankle, no ligament laxity. Pulses:  R and L posterior tibial pulses are full and equal bilaterally  Extremities:  nonpitting edema bilateral ankles, slightly more pronounced left lower leg Neurologic:  sensation intact Skin:  Intact without suspicious lesions or rashes,  no marked erythema/edema/calor L leg compared to right.   Impression & Recommendations:  Problem # 1:  LEG PAIN, LEFT (ICD-729.5) unclear etiology.  ? peroneal tendonitis after prolonged standing on ladder vs PVD.  No significant tenderness along any tendon sheath today.  Treat conservatively for now with NSAIDs, tylenol, and ice pack/heating pad to area.  RTC if not improved.  low likelihood for DVT  today, no evidence of such on exam or by history although does have some risk factor in form of recent CA.  Pt states can take NSAIDs fine.  Complete Medication List: 1)  Cardizem Cd 240 Mg Cp24 (Diltiazem hcl coated beads) .Marland Kitchen.. 1 by mouth once daily 2)  Hydrochlorothiazide 25 Mg Tabs (Hydrochlorothiazide) .Marland Kitchen.. 1 by mouth once daily 3)  Adult Aspirin Low Strength 81 Mg Tbdp (Aspirin) .Marland Kitchen.. 1 by mouth once daily 4)  Alprazolam 0.5 Mg Tbdp (Alprazolam) .... 1/2 tab by mouth two times a day as needed anxiety 5)  Glucosamine 500 Mg Caps (Glucosamine sulfate) .... Once daily 6)  Mirtazapine 15 Mg Tabs (Mirtazapine) .Marland Kitchen.. 1 tab by mouth at bedtime  Patient Instructions: 1)  Start with icing and then heat to leg. 2)  Try anti inflammatories (ibuprofen 400mg  every 4-6 hours). 3)  If not improving on this regimen, please return to be seen.  we want you feeling better by the time your trip comes around.  4)  Good to meet you today.  Call clinic with questions.  Current Allergies (reviewed today): No known allergies

## 2010-11-26 NOTE — Assessment & Plan Note (Signed)
Summary: ?uti/hmw   Vital Signs:  Patient profile:   77 year old female Height:      64 inches Weight:      121.6 pounds BMI:     20.95 Temp:     97.6 degrees F oral Pulse rate:   76 / minute Pulse rhythm:   regular BP sitting:   118 / 78  (left arm) Cuff size:   regular  Vitals Entered By: Benny Lennert CMA Duncan Dull) (November 21, 2009 9:04 AM)  History of Present Illness: Chief complaint ?uti  No dysuria, no abdominal pain. improved diarrhea s/p colon resection. Still slightly decrease urine flow.  Recurrent UTI.Marland Kitchen took 2 days of levaquin....did not tolerate..so swtiched to 3 weeks of cipro.  She did have radiation to rectum and chemo.   B arm pain upper..for past 2 weeks No shoulder and no elbow pain. Npo redness, no rash. Pain to brush hair and to lay down at night.  Arms weak quickly.  Some sharp  neck pain when lying down sleeping on left face. no radiatoion down arms.  ibuprofen helps some.   Problems Prior to Update: 1)  Diarrhea  (ICD-787.91) 2)  Uti  (ICD-599.0) 3)  Rectal Cancer  (ICD-154.1) 4)  Allergic Rhinitis  (ICD-477.9) 5)  Hemorrhoids, Internal  (ICD-455.0) 6)  Other Screening Mammogram  (ICD-V76.12) 7)  Screening For Osteoporosis  (ICD-V82.81) 8)  Nausea  (ICD-787.02) 9)  Anxiety, Situational  (ICD-308.3) 10)  Peripheral Edema  (ICD-782.3) 11)  Osteoporosis  (ICD-733.00) 12)  Osteoarthritis  (ICD-715.90) 13)  Hypertension  (ICD-401.9) 14)  Hyperlipidemia  (ICD-272.4)  Current Medications (verified): 1)  Cardizem Cd 240 Mg  Cp24 (Diltiazem Hcl Coated Beads) .Marland Kitchen.. 1 By Mouth Once Daily 2)  Hydrochlorothiazide 25 Mg  Tabs (Hydrochlorothiazide) .Marland Kitchen.. 1 By Mouth Once Daily 3)  Adult Aspirin Low Strength 81 Mg  Tbdp (Aspirin) .Marland Kitchen.. 1 By Mouth Once Daily 4)  Alprazolam 0.5 Mg Tbdp (Alprazolam) .... 1/2 Tab By Mouth Two Times A Day As Needed Anxiety  Allergies (verified): No Known Drug Allergies  Past History:  Past medical, surgical, family and  social histories (including risk factors) reviewed, and no changes noted (except as noted below).  Past Medical History: Reviewed history from 10/22/2009 and no changes required. colon cancer, status post surgery, chemotherapy HEMORRHOIDS, INTERNAL ANXIETY, SITUATIONAL PERIPHERAL EDEMA OSTEOPOROSIS OSTEOARTHRITIS HYPERTENSION  HYPERLIPIDEMIA  Past Surgical History: Reviewed history from 07/23/2009 and no changes required. 2004    DEXA -2.6 - osteoporosis             Cardiolite             Renal artery duplex  Dental Implants 2010 preop chemoradiation 06/2009 rectal cancer stage 3..partial colectomy, with temporary colostomy placed  Family History: Reviewed history from 01/02/2009 and no changes required. Father: Died 60 Mother: Died 77 CVA Siblings: 2 brothers healthy, 1 sister healthy Arthritis:  Mother and brother Depression:  Sister No FH of Colon Cancer:  Social History: Reviewed history from 12/25/2008 and no changes required. Never Smoked Alcohol use-no Drug use-no Regular exercise-yes, walks daily - 1 mile, recently joined [employer] Marital Status: Married x 50 years Children: 2 daughters healthy, 1 son died MVA Occupation: Retired, Estate manager/land agent Diet:  Veg  & fruit, (+) H2O, balanced diet  Review of Systems General:  Denies fatigue and fever. CV:  Denies chest pain or discomfort. Resp:  Denies shortness of breath.  Physical Exam  General:  Well-developed,well-nourished,in no acute distress; alert,appropriate and cooperative throughout  examination Neck:  no carotid bruit or thyromegaly, no cervical or supraclavicular lymphadenopathy  Lungs:  Normal respiratory effort, chest expands symmetrically. Lungs are clear to auscultation, no crackles or wheezes. Heart:  Normal rate and regular rhythm. S1 and S2 normal without gallop, murmur, click, rub or other extra sounds. Abdomen:  Bowel sounds positive,abdomen soft and non-tender without masses, organomegaly  or hernias noted. No CVA tenderness Msk:  neg SPurling..decrease ROM in neck lateral rotation and lateral flexion..nml flexion and extension. No ttp vertebral bodies Pain to palpation at biceps and some at biceps tendon.  Neg neer's and no pain with int and ext rotation at shoulder.  Neurologic:  No cranial nerve deficits noted. Station and gait are normal.  Sensory, motor and coordinative functions appear intact.   Impression & Recommendations:  Problem # 1:  UTI (ICD-599.0) Assessment Improved No current infection. Urinary changes may be due to radiation to rectum.  The following medications were removed from the medication list:    Ciprofloxacin Hcl 750 Mg Tabs (Ciprofloxacin hcl) .Marland Kitchen... 1 tab by mouth two times a day x 14 days  Orders: UA Dipstick w/o Micro (manual) (44034)  Problem # 2:  ARM PAIN (ICD-729.5) Offered eval with cervical spine X-rays. pt wants to wait to see if it will improve on its own.  treat with NSAIDs, heat and stretching. Call if interested in further work up.   Problem # 3:  NECK PAIN (ICD-723.1)  Her updated medication list for this problem includes:    Adult Aspirin Low Strength 81 Mg Tbdp (Aspirin) .Marland Kitchen... 1 by mouth once daily  Complete Medication List: 1)  Cardizem Cd 240 Mg Cp24 (Diltiazem hcl coated beads) .Marland Kitchen.. 1 by mouth once daily 2)  Hydrochlorothiazide 25 Mg Tabs (Hydrochlorothiazide) .Marland Kitchen.. 1 by mouth once daily 3)  Adult Aspirin Low Strength 81 Mg Tbdp (Aspirin) .Marland Kitchen.. 1 by mouth once daily 4)  Alprazolam 0.5 Mg Tbdp (Alprazolam) .... 1/2 tab by mouth two times a day as needed anxiety  Patient Instructions: 1)  Urine is clear.  2)  Call if arm pain and neck pain not improving..for cervical spine X-ray. 3)  Work on arm stretching. Use ibuprofen 800 mg every 8 hours for pain.  Prescriptions: ALPRAZOLAM 0.5 MG TBDP (ALPRAZOLAM) 1/2 tab by mouth two times a day as needed anxiety  #30 x 0   Entered and Authorized by:   Kerby Nora MD   Signed by:    Kerby Nora MD on 11/21/2009   Method used:   Print then Give to Patient   RxID:   [id number]   Current Allergies (reviewed today): No known allergies   Laboratory Results   Urine Tests  Date/Time Received: November 21, 2009 9:21 AM  Date/Time Reported: November 21, 2009 9:21 AM   Routine Urinalysis   Color: yellow Appearance: Clear Glucose: negative   (Normal Range: Negative) Bilirubin: negative   (Normal Range: Negative) Ketone: negative   (Normal Range: Negative) Spec. Gravity: >=1.030   (Normal Range: 1.003-1.035) Blood: negative   (Normal Range: Negative) pH: 6.0   (Normal Range: 5.0-8.0) Protein: trace   (Normal Range: Negative) Urobilinogen: 0.2   (Normal Range: 0-1) Nitrite: negative   (Normal Range: Negative) Leukocyte Esterace: negative   (Normal Range: Negative)

## 2010-11-26 NOTE — Miscellaneous (Signed)
Summary: [facility]  Clinical Lists Changes  Medications: Added new medication of MIRALAX   POWD (POLYETHYLENE GLYCOL 3350) As per prep  instructions. - Signed Added new medication of REGLAN 10 MG  TABS (METOCLOPRAMIDE HCL) As per prep instructions. - Signed Added new medication of DULCOLAX 5 MG  TBEC (BISACODYL) Day before procedure take 2 at 3pm and 2 at 8pm. - Signed Rx of MIRALAX   POWD (POLYETHYLENE GLYCOL 3350) As per prep  instructions.;  #255gm x 0;  Signed;  Entered by: Ezra Sites RN;  Authorized by: Hart Carwin MD;  Method used: Electronically to [facility] [address] [place], [place]  [address], Ph: [phone], Fax: [phone] Rx of REGLAN 10 MG  TABS (METOCLOPRAMIDE HCL) As per prep instructions.;  #2 x 0;  Signed;  Entered by: Ezra Sites RN;  Authorized by: Hart Carwin MD;  Method used: Electronically to [facility] [address] [place], [place]  [address], Ph: [phone], Fax: [phone] Rx of DULCOLAX 5 MG  TBEC (BISACODYL) Day before procedure take 2 at 3pm and 2 at 8pm.;  #4 x 0;  Signed;  Entered by: Ezra Sites RN;  Authorized by: Hart Carwin MD;  Method used: Electronically to [facility] [address] [place], [place]  [address], Ph: [phone], Fax: [phone] Allergies: Added new allergy or adverse reaction of BENADRYL Observations: Added new observation of NKA: F (01/08/2010 11:18)    Prescriptions: DULCOLAX 5 MG  TBEC (BISACODYL) Day before procedure take 2 at 3pm and 2 at 8pm.  #4 x 0   Entered by:   Ezra Sites RN   Authorized by:   Hart Carwin MD   Signed by:   Ezra Sites RN on 01/08/2010   Method used:   Electronically to        [facility] [address] (retail)       [address]       [place], [place]  [address]       Ph: [phone]       Fax: [phone]   RxID:   [id number] REGLAN 10 MG  TABS (METOCLOPRAMIDE HCL) As per prep instructions.  #2 x 0   Entered by:    Ezra Sites RN   Authorized by:   Hart Carwin MD   Signed by:   Ezra Sites RN on 01/08/2010   Method used:   Electronically to        [facility] [address] (retail)       [address]       [place], [place]  [address]       Ph: [phone]       Fax: [phone]   RxID:   [id number] MIRALAX   POWD (POLYETHYLENE GLYCOL 3350) As per prep  instructions.  #255gm x 0   Entered by:   Ezra Sites RN   Authorized by:   Hart Carwin MD   Signed by:   Ezra Sites RN on 01/08/2010   Method used:   Electronically to        [facility] [address] (retail)       [address]       [place], [place]  [address]       Ph: [phone]       Fax: [phone]   RxID:   [id number]

## 2010-11-26 NOTE — Progress Notes (Signed)
Summary: call a nurse  Phone Note Call from Patient   Call For: Kerby Nora MD Summary of Call: Triage Record Num: [id number] Operator: Patriciaann Clan Patient Name: [name] Call Date & Time: 03/22/2010 6:54:02PM Patient Phone: [phone] PCP: Kerby Nora Patient Gender: Female PCP Fax : Patient DOB: 1932/12/20 Practice Name: [facility] Reason for Call: Patient calling. States developed urinary urgency and burning, onset 03/22/10 a.m. States started Azo and drinking cranberry juice and increased water 03/22/10. Afebrile. Denies hematuria, denies flank or abdominal pain. Patient advised to be evaluated in ED. Patient requests for Dr.Niaya Hickok to be notified. 1903: Page placed to [facility] per patient request. 1904: Dr. Ermalene Searing returned call. Orders received: Cipro 250mg . p.o. BID X 7 days. If sx not resolving within 72 hours, patient to call office. Patient advised of above, call back parameters reviewed, care advice given. Patient verbalizes understanding. Above Rx with no refills called into [facility] in [place] @ [phone]. Spoke with Pharmacist/Mike. Protocol(s) Used: Urinary Symptoms - Female Recommended Outcome per Protocol: See Provider within 24 hours Reason for Outcome: Urinary tract symptoms Care Advice: Increase intake of fluids. Try to drink 8 oz. (.2 liter) every hour when awake, including unsweetened cranberry juice, unless on restricted fluids for other medical reasons. Take sips of fluid or eat ice chips if nauseated or vomiting.  ~ Limit carbonated, alcoholic, and caffeinated beverages such as coffee, tea and soda. Avoid OTC cold and allergy medications that contain caffeine. Limit intake of tomatoes, fruit juices (except for unsweetened cranberry juice), dairy products, spicy foods, sugar, and artificial sweeteners (aspartame or saccharine). Stop or decrease smoking. Reducing exposure to bladder irritants may help lessen  urgency.  ~ Systemic Inflammatory Response Syndrome (SIRS): Watch for signs of a generalized, whole body infection. Occurs within days of a localized infec Initial call taken by: Melody Comas,  Mar 24, 2010 9:34 AM

## 2010-11-26 NOTE — Letter (Signed)
Summary: Previsit letter  [facility]  [address] [place], [place] [address]   Phone: [phone]  Fax: [phone]       01/01/2010 MRN: [id number]  [facility] [address] [place], [place]  [address]  Dear Ms. Barlett,  Welcome to the Gastroenterology Division at [facility].    You are scheduled to see a nurse for your pre-procedure visit on 01-08-10  at 11am on the 3rd floor at [facility], [address]  We ask that you try to arrive at our office 15 minutes prior to your appointment time to allow for check-in.  Your nurse visit will consist of discussing your medical and surgical history, your immediate family medical history, and your medications.    Please bring a complete list of all your medications or, if you prefer, bring the medication bottles and we will list them.  We will need to be aware of both prescribed and over the counter drugs.  We will need to know exact dosage information as well.  If you are on blood thinners (Coumadin, Plavix, Aggrenox, Ticlid, etc.) please call our office today/prior to your appointment, as we need to consult with your physician about holding your medication.   Please be prepared to read and sign documents such as consent forms, a financial agreement, and acknowledgement forms.  If necessary, and with your consent, a friend or relative is welcome to sit-in on the nurse visit with you.  Please bring your insurance card so that we may make a copy of it.  If your insurance requires a referral to see a specialist, please bring your referral form from your primary care physician.  No co-pay is required for this nurse visit.     If you cannot keep your appointment, please call [phone] to cancel or reschedule prior to your appointment date.  This allows [place] the opportunity to schedule an appointment for another patient in need of care.    Thank you for choosing [facility] for your medical needs.   We appreciate the opportunity to care for you.  Please visit [place] at our website  to learn more about our practice.                     Sincerely.                                                                                                                   The Gastroenterology Division

## 2010-11-26 NOTE — Procedures (Signed)
Summary: COLON   Colonoscopy  Procedure date:  03/02/2002  Findings:      Location:  [facility].    Procedures Next Due Date:    Colonoscopy: 02/2005 Patient Name: Laurie, [name] MRN:  Procedure Procedures: Colonoscopy CPT: 28413.    with polypectomy. CPT: A3573898.  Personnel: Endoscopist: Renella Steig L. Juanda Chance, MD.  Referred By: Angelena Sole, MD.  Exam Location: Exam performed in Outpatient Clinic. Outpatient  Patient Consent: Procedure, Alternatives, Risks and Benefits discussed, consent obtained, from patient. Consent was obtained by the RN.  Indications  Average Risk Screening Routine.  History  Pre-Exam Physical: Entire physical exam was normal.  Exam Exam: Extent of exam reached: Cecum, extent intended: Cecum.  The cecum was identified by appendiceal orifice and IC valve. Patient position: on left side. Colon retroflexion performed. Images taken. ASA Classification: II. Tolerance: good.  Monitoring: Pulse and BP monitoring, Oximetry used. Supplemental O2 given.  Colon Prep Used Golytely for colon prep.  Sedation Meds: Patient assessed and found to be appropriate for moderate (conscious) sedation. Fentanyl 100 mcg. given IV. Versed 6 mg. given IV.  Findings POLYP: Sigmoid Colon, Distance from Anus 30 cm. Procedure:  hot biopsy, removed, retrieved, Polyp sent to pathology. ICD9: Colon Polyps: 211.3. Comments: sessile mass vs polyp.  - NORMAL EXAM: Cecum to Sigmoid Colon.  POLYP: Rectum, Maximum size: 5 mm. Distance from Anus 20 cm. removed, retrieved, sent to pathology. ICD9: Colon Polyps: 211.3.   Assessment Abnormal examination, see findings above.  Diagnoses: 211.3: Colon Polyps.   Events  Unplanned Interventions: No intervention was required.  Unplanned Events: There were no complications. Plans  Post Exam Instructions: No aspirin or non-steroidal containing medications: x 2 weeks.  Patient Education: Patient given standard  instructions for: Polyps.  Comments: repeat colon exam in 3 years Disposition: After procedure patient sent to recovery. After recovery patient sent home.   This report was created from the original endoscopy report, which was reviewed and signed by the above listed endoscopist.

## 2010-11-26 NOTE — Letter (Signed)
Summary: [facility]   Imported By: Sherian Rein 05/25/2010 08:09:19  _____________________________________________________________________  External Attachment:    Type:   Image     Comment:   External Document

## 2010-11-26 NOTE — Progress Notes (Signed)
Summary: Cipro/ [facility] pharmacy  Phone Note From Pharmacy   Caller: Thayer Ohm @ [facility] [phone] Call For: Dr. Ermalene Searing  Summary of Call: pharmacist received rx for CIPROFLOXACIN HCL 750 MG TABS (CIPROFLOXACIN HCL) 1 tab by mouth two times a day x 14 days  #28 x 0 on 10/28/2009 and on 10/22/2009 rx for LEVAQUIN 500 MG TABS (LEVOFLOXACIN) Take 1 tab by mouth daily  #21 x 0 pharmacist is concerned about taking these 2 together. Also he does not have 750mg  of cipro, can he change sig to 1 1/2? he has 500mg  of Cipro. Initial call taken by: Mervin Hack CMA Duncan Dull),  October 28, 2009 3:55 PM  Follow-up for Phone Call        spoke with pharmacist and advised patient know about change and also okay the 1 1/2 500 mg pills Follow-up by: Benny Lennert CMA Duncan Dull),  October 28, 2009 3:57 PM

## 2010-11-26 NOTE — Progress Notes (Signed)
Summary: Pt request f/u for UTI  Phone Note Call from Patient Call back at [phone]   Caller: Patient Call For: Kerby Nora MD Summary of Call: Pt saw Dr Patsy Lager on 10/22/09 for UTI. Pt has finished antibiotics and wants to be seen to get  urine check to make sure she is over the UTI. Pt not having symptoms now of UTI. But pt said she is voiding small amounts but pt said she does not drink alot and is trying to drink more water. Pt said she had cancer surgery (rebuilt rectum) Aug 2010 and November had bowel reconnected and she thinks that is why she is not drinking as much. Pt thinks she is OK but would like to make sure by seeing Dr. Ermalene Searing. Pt uses [facility] on [address] in [place] if needed. Please advise.  Initial call taken by: Lewanda Rife LPN,  November 18, 2009 9:38 AM  Follow-up for Phone Call        Okay to make appt for patient this week.  Follow-up by: Kerby Nora MD,  November 18, 2009 9:49 AM  Additional Follow-up for Phone Call Additional follow up Details #1::        patient advised.Consuello Masse CMA  Additional Follow-up by: Benny Lennert CMA Duncan Dull),  November 18, 2009 3:02 PM

## 2010-11-26 NOTE — Letter (Signed)
[facility]   Imported By: Lanelle Bal 06/02/2010 08:58:05  _____________________________________________________________________  External Attachment:    Type:   Image     Comment:   External Document

## 2010-11-26 NOTE — Assessment & Plan Note (Signed)
Summary: CPX/RBH   Vital Signs:  Patient profile:   77 year old female Height:      64 inches Weight:      119.13 pounds BMI:     20.52 Temp:     97.5 degrees F oral BP sitting:   128 / 78  (left arm) Cuff size:   regular  Vitals Entered By: Delilah Shan CMA Duncan Dull) (January 06, 2010 2:03 PM) CC: CPX, Hypertension Management   History of Present Illness: In last year she has had colorectal resection for rectal cancer. Drinking ensure between meals, eating regular meals as well. Contnues to have weight loss and intermittant diarrhea.  Still some anxiety, difficulty sleeping at night. Using alprazolam 1/2 tab  every day at bedtime.   2 weeks ago hospital admission for abdominal pain, partial bowel obstruction. Ate minimally in hospital. Yesterday and today has been doing better with diarrhea. Takes probiotic daily. Using immodium frequently.   She has also seen GYN for incidentally found endometrial mass on [place]...felt lesion was benign an no further work up needed.    Hypertension History:      Well controlled at hoe.        Positive major cardiovascular risk factors include female age 75 years old or older, hyperlipidemia, and hypertension.  Negative major cardiovascular risk factors include non-tobacco-user status.     Problems Prior to Update: 1)  Nonspecific Abn Finding Rad & Oth Exam Gu Organ  (ICD-793.5) 2)  Arm Pain  (ICD-729.5) 3)  Neck Pain  (ICD-723.1) 4)  Diarrhea  (ICD-787.91) 5)  Uti  (ICD-599.0) 6)  Rectal Cancer  (ICD-154.1) 7)  Allergic Rhinitis  (ICD-477.9) 8)  Hemorrhoids, Internal  (ICD-455.0) 9)  Other Screening Mammogram  (ICD-V76.12) 10)  Screening For Osteoporosis  (ICD-V82.81) 11)  Nausea  (ICD-787.02) 12)  Anxiety, Situational  (ICD-308.3) 13)  Peripheral Edema  (ICD-782.3) 14)  Osteoporosis  (ICD-733.00) 15)  Osteoarthritis  (ICD-715.90) 16)  Hypertension  (ICD-401.9) 17)  Hyperlipidemia  (ICD-272.4)  Current Medications (verified): 1)   Cardizem Cd 240 Mg  Cp24 (Diltiazem Hcl Coated Beads) .Marland Kitchen.. 1 By Mouth Once Daily 2)  Hydrochlorothiazide 25 Mg  Tabs (Hydrochlorothiazide) .Marland Kitchen.. 1 By Mouth Once Daily 3)  Adult Aspirin Low Strength 81 Mg  Tbdp (Aspirin) .Marland Kitchen.. 1 By Mouth Once Daily 4)  Alprazolam 0.5 Mg Tbdp (Alprazolam) .... 1/2 Tab By Mouth Two Times A Day As Needed Anxiety 5)  Glucosamine 500 Mg Caps (Glucosamine Sulfate) .... Once Daily 6)  Multivitamins   Tabs (Multiple Vitamin) .... Take 1 Tablet By Mouth Once A Day 7)  Mirtazapine 15 Mg Tabs (Mirtazapine) .Marland Kitchen.. 1 Tab By Mouth At Bedtime  Allergies (verified): No Known Drug Allergies  Past History:  Past medical, surgical, family and social histories (including risk factors) reviewed, and no changes noted (except as noted below).  Past Medical History: Reviewed history from 10/22/2009 and no changes required. colon cancer, status post surgery, chemotherapy HEMORRHOIDS, INTERNAL ANXIETY, SITUATIONAL PERIPHERAL EDEMA OSTEOPOROSIS OSTEOARTHRITIS HYPERTENSION  HYPERLIPIDEMIA  Past Surgical History: Reviewed history from 07/23/2009 and no changes required. 2004    DEXA -2.6 - osteoporosis             Cardiolite             Renal artery duplex  Dental Implants 2010 preop chemoradiation 06/2009 rectal cancer stage 3..partial colectomy, with temporary colostomy placed  Family History: Reviewed history from 01/02/2009 and no changes required. Father: Died 66 Mother: Died 40 CVA Siblings: 2  brothers healthy, 1 sister healthy Arthritis:  Mother and brother Depression:  Sister No FH of Colon Cancer:  Social History: Reviewed history from 12/25/2008 and no changes required. Never Smoked Alcohol use-no Drug use-no Regular exercise-yes, walks daily - 1 mile, recently joined [employer] Marital Status: Married x 50 years Children: 2 daughters healthy, 1 son died MVA Occupation: Retired, Estate manager/land agent Diet:  Veg  & fruit, (+) H2O, balanced diet  Review of  Systems General:  Denies fatigue and fever. CV:  Denies chest pain or discomfort. Resp:  Denies shortness of breath. GI:  Complains of diarrhea; denies bloody stools. Derm:  Denies lesion(s).  Physical Exam  General:  Thin appearing female IN NAD Ears:  External ear exam shows no significant lesions or deformities.  Otoscopic examination reveals clear canals, tympanic membranes are intact bilaterally without bulging, retraction, inflammation or discharge. Hearing is grossly normal bilaterally. Nose:  External nasal examination shows no deformity or inflammation. Nasal mucosa are pink and moist without lesions or exudates. Mouth:  Oral mucosa and oropharynx without lesions or exudates.  Teeth in good repair. Neck:  no carotid bruit or thyromegaly no cervical or supraclavicular lymphadenopathy  Chest Wall:  No deformities, masses, or tenderness noted. Breasts:  No mass, nodules, thickening, tenderness, bulging, retraction, inflamation, nipple discharge or skin changes noted.   Lungs:  Normal respiratory effort, chest expands symmetrically. Lungs are clear to auscultation, no crackles or wheezes. Heart:  Normal rate and regular rhythm. S1 and S2 normal without gallop, murmur, click, rub or other extra sounds. Abdomen:  Bowel sounds positive,abdomen soft and non-tender without masses, organomegaly or hernias noted. Pulses:  R and L posterior tibial pulses are full and equal bilaterally  Extremities:  no edema  Skin:  Intact without suspicious lesions or rashes Psych:  Cognition and judgment appear intact. Alert and cooperative with normal attention span and concentration. No apparent delusions, illusions, hallucinations   Impression & Recommendations:  Problem # 1:  ANXIETY, SITUATIONAL (ICD-308.3) May be contributing to weight loss. Will try mirtazapine for mood, sleep and appetite.   Problem # 2:  DIARRHEA (ICD-787.91) If not improving, can consider questran for chronci diarrhea.    Problem # 3:  HYPERTENSION (ICD-401.9) Well controlled. Continue current medication.  Her updated medication list for this problem includes:    Cardizem Cd 240 Mg Cp24 (Diltiazem hcl coated beads) .Marland Kitchen... 1 by mouth once daily    Hydrochlorothiazide 25 Mg Tabs (Hydrochlorothiazide) .Marland Kitchen... 1 by mouth once daily  Problem # 4:  Preventive Health Care (ICD-V70.0) Assessment: Comment Only The patient's preventative maintenance and recommended screening tests for an annual wellness exam were reviewed in full today. Brought up to date unless services declined.  Counselled on the importance of diet, exercise, and its role in overall health and mortality. The patient's FH and SH was reviewed, including their home life, tobacco status, and drug and alcohol status.     Complete Medication List: 1)  Cardizem Cd 240 Mg Cp24 (Diltiazem hcl coated beads) .Marland Kitchen.. 1 by mouth once daily 2)  Hydrochlorothiazide 25 Mg Tabs (Hydrochlorothiazide) .Marland Kitchen.. 1 by mouth once daily 3)  Adult Aspirin Low Strength 81 Mg Tbdp (Aspirin) .Marland Kitchen.. 1 by mouth once daily 4)  Alprazolam 0.5 Mg Tbdp (Alprazolam) .... 1/2 tab by mouth two times a day as needed anxiety 5)  Glucosamine 500 Mg Caps (Glucosamine sulfate) .... Once daily 6)  Multivitamins Tabs (Multiple vitamin) .... Take 1 tablet by mouth once a day 7)  Mirtazapine 15 Mg  Tabs (Mirtazapine) .Marland Kitchen.. 1 tab by mouth at bedtime  Other Orders: Radiology Referral (Radiology)  Hypertension Assessment/Plan:      The patient's hypertensive risk group is category B: At least one risk factor (excluding diabetes) with no target organ damage.  Her calculated 10 year risk of coronary heart disease is 7 %.  Today's blood pressure is 128/78.  Her blood pressure goal is < 140/90.  Patient Instructions: 1)  Fasting lipids, CMET Dx 272.0 2)  Referral Appointment Information 3)  Day/Date: 4)  Time: 5)  Place/MD: 6)  Address: 7)  Phone/Fax: 8)  Patient given appointment information.  Information/Orders faxed/mailed.  9)  Please schedule a follow-up appointment in 1-2 month mood and diarrhea. Prescriptions: MIRTAZAPINE 15 MG TABS (MIRTAZAPINE) 1 tab by mouth at bedtime  #30 x 11   Entered and Authorized by:   Kerby Nora MD   Signed by:   Kerby Nora MD on 01/06/2010   Method used:   Electronically to        [facility] [address] (retail)       [address]       [place], [place]  [address]       Ph: [phone]       Fax: [phone]   RxID:   [id number]   Current Allergies (reviewed today): No known allergies   Herpes Zoster Next Due:  Refused Bone Density Next Due: Refused Has 01/16/2010

## 2010-11-26 NOTE — Letter (Signed)
Summary: [facility]  [facility]   Imported By: Sherian Rein 05/25/2010 07:46:13  _____________________________________________________________________  External Attachment:    Type:   Image     Comment:   External Document

## 2010-11-26 NOTE — Letter (Signed)
[facility]   Imported By: Sherian Rein 05/25/2010 08:12:14  _____________________________________________________________________  External Attachment:    Type:   Image     Comment:   External Document

## 2010-11-26 NOTE — Letter (Signed)
Summary: [facility]  [facility]   Imported By: Sherian Rein 05/25/2010 08:04:34  _____________________________________________________________________  External Attachment:    Type:   Image     Comment:   External Document

## 2010-11-26 NOTE — Assessment & Plan Note (Signed)
Summary: 2 M F/U MOOD AND DIARRHEA/DLO   Vital Signs:  Patient profile:   77 year old female Weight:      124 pounds BMI:     21.36 Temp:     97.6 degrees F oral Pulse rate:   68 / minute Pulse rhythm:   regular BP sitting:   134 / 78  (left arm) Cuff size:   regular  Vitals Entered By: Lowella Petties CMA (Mar 13, 2010 11:10 AM) CC: 2 month follow up   History of Present Illness: 5 lb weight gain with mirtazpapine.  Appetitie is much improved.   Anxiety is well controlled. Still using xanax 1/2 tab by mouth every few few days.   Rare diarrhea recently. Using probiotic daily. Using immodium regularly more for a preventative.   Allergies: 1)  ! Benadryl  Past History:  Past medical, surgical, family and social histories (including risk factors) reviewed, and no changes noted (except as noted below).  Past Medical History: Reviewed history from 10/22/2009 and no changes required. colon cancer, status post surgery, chemotherapy HEMORRHOIDS, INTERNAL ANXIETY, SITUATIONAL PERIPHERAL EDEMA OSTEOPOROSIS OSTEOARTHRITIS HYPERTENSION  HYPERLIPIDEMIA  Past Surgical History: Reviewed history from 07/23/2009 and no changes required. 2004    DEXA -2.6 - osteoporosis             Cardiolite             Renal artery duplex  Dental Implants 2010 preop chemoradiation 06/2009 rectal cancer stage 3..partial colectomy, with temporary colostomy placed  Family History: Reviewed history from 01/02/2009 and no changes required. Father: Died 61 Mother: Died 42 CVA Siblings: 2 brothers healthy, 1 sister healthy Arthritis:  Mother and brother Depression:  Sister No FH of Colon Cancer:  Social History: Reviewed history from 12/25/2008 and no changes required. Never Smoked Alcohol use-no Drug use-no Regular exercise-yes, walks daily - 1 mile, recently joined [employer] Marital Status: Married x 50 years Children: 2 daughters healthy, 1 son died MVA Occupation: Retired, Merchandiser, retail Diet:  Veg  & fruit, (+) H2O, balanced diet  Review of Systems General:  Denies fatigue and fever. CV:  Denies chest pain or discomfort. Resp:  Denies shortness of breath. Psych:  Denies depression and suicidal thoughts/plans.  Physical Exam  General:  Well-developed,well-nourished,in no acute distress; alert,appropriate and cooperative throughout examination Mouth:  MMM Neck:  no carotid bruit or thyromegaly no cervical or supraclavicular lymphadenopathy  Lungs:  Normal respiratory effort, chest expands symmetrically. Lungs are clear to auscultation, no crackles or wheezes. Heart:  Normal rate and regular rhythm. S1 and S2 normal without gallop, murmur, click, rub or other extra sounds. Abdomen:  Bowel sounds positive,abdomen soft and non-tender without masses, organomegaly or hernias noted. Pulses:  R and L posterior tibial pulses are full and equal bilaterally  Extremities:  no edema   Impression & Recommendations:  Problem # 1:  DIARRHEA (ICD-787.91) Well controlled. S/P radiation and surgery for  rectal cancer.  Problem # 2:  ANXIETY, SITUATIONAL (ICD-308.3) Significant healthy weight gain, better appetite. Mood well controlled. Still occ uses Xanax.Total visit time > 50% spent counseling and cordinating patients care tabs lasted 5 months.   Complete Medication List: 1)  Cardizem Cd 240 Mg Cp24 (Diltiazem hcl coated beads) .Marland Kitchen.. 1 by mouth once daily 2)  Hydrochlorothiazide 25 Mg Tabs (Hydrochlorothiazide) .Marland Kitchen.. 1 by mouth once daily 3)  Adult Aspirin Low Strength 81 Mg Tbdp (Aspirin) .Marland Kitchen.. 1 by mouth once daily 4)  Alprazolam 0.5 Mg Tbdp (Alprazolam) .Marland KitchenMarland KitchenMarland Kitchen  1/2 tab by mouth two times a day as needed anxiety 5)  Glucosamine 500 Mg Caps (Glucosamine sulfate) .... Once daily 6)  Multivitamins Tabs (Multiple vitamin) .... Take 1 tablet by mouth once a day 7)  Mirtazapine 15 Mg Tabs (Mirtazapine) .Marland Kitchen.. 1 tab by mouth at bedtime Prescriptions: ALPRAZOLAM 0.5 MG TBDP  (ALPRAZOLAM) 1/2 tab by mouth two times a day as needed anxiety  #30 x 0   Entered and Authorized by:   Kerby Nora MD   Signed by:   Kerby Nora MD on 03/13/2010   Method used:   Print then Give to Patient   RxID:   [id number]   Prior Medications (reviewed today): CARDIZEM CD 240 MG  CP24 (DILTIAZEM HCL COATED BEADS) 1 by mouth once daily HYDROCHLOROTHIAZIDE 25 MG  TABS (HYDROCHLOROTHIAZIDE) 1 by mouth once daily ADULT ASPIRIN LOW STRENGTH 81 MG  TBDP (ASPIRIN) 1 by mouth once daily ALPRAZOLAM 0.5 MG TBDP (ALPRAZOLAM) 1/2 tab by mouth two times a day as needed anxiety GLUCOSAMINE 500 MG CAPS (GLUCOSAMINE SULFATE) once daily MULTIVITAMINS   TABS (MULTIPLE VITAMIN) Take 1 tablet by mouth once a day MIRTAZAPINE 15 MG TABS (MIRTAZAPINE) 1 tab by mouth at bedtime Current Allergies: ! BENADRYL

## 2010-11-26 NOTE — Letter (Signed)
[facility]   Imported By: Sherian Rein 05/25/2010 07:56:17  _____________________________________________________________________  External Attachment:    Type:   Image     Comment:   External Document

## 2010-11-26 NOTE — Procedures (Signed)
Summary[facility]   Imported By: Lanelle Bal 06/16/2010 09:08:16  _____________________________________________________________________  External Attachment:    Type:   Image     Comment:   External Document

## 2010-11-26 NOTE — Letter (Signed)
[facility]   Imported By: Sherian Rein 05/25/2010 07:50:01  _____________________________________________________________________  External Attachment:    Type:   Image     Comment:   External Document

## 2010-11-26 NOTE — Progress Notes (Signed)
Summary: Pt. cancelled Colonoscopy  Phone Note Call from Patient Call back at Home Phone [phone]   Call For: Dr Juanda Chance Summary of Call: Her Doctor at [facility] is not sure having a Colon tomorrow is a great idea right now. Wants to talk to nurse. Initial call taken by: Leanor Kail [employer] [facility],  January 15, 2010 8:59 AM  Follow-up for Phone Call        Pt. states she has been living on Immodium & probiotics since her Colon Cancer surgery, 06/2009. She was in [facility] for 5 days due to adhesions, 2 weeks ago.  Now pt. states she has been doing very well, no Immodium needed. Pt. states Dr.Thacker, her [facility] surgeon, recommends she wait for 1 month to have the Colonoscopy. Pt. wants to reschedule the procedure, but she doesn't want to be charged the r/s fee, I told her we wouldn't charge her, but in the future she will need to follow our guidelines, she agrees. Pt. states she will callback in a few weeks to get the Colonoscopy rescheduled.  Follow-up by: Laureen Ochs LPN,  January 15, 2010 9:29 AM  Additional Follow-up for Phone Call Additional follow up Details #1::        Her recall colon is actually in August 2011 because her surgery was postponed due to radiation and Chemo. till Aug 2011. It is OK to wait til Aug 2011. Additional Follow-up by: Hart Carwin MD,  January 15, 2010 11:16 PM    Additional Follow-up for Phone Call Additional follow up Details #2::    Patient  is aware of Dr Regino Schultze recommendations. Follow-up by: Darcey Nora RN, CGRN,  January 16, 2010 10:59 AM

## 2010-11-26 NOTE — Op Note (Signed)
Summary: [facility]  [facility]   Imported By: Sherian Rein 05/25/2010 07:57:34  _____________________________________________________________________  External Attachment:    Type:   Image     Comment:   External Document

## 2010-11-26 NOTE — Progress Notes (Signed)
Summary: call a nurse  Phone Note Call from Patient   Caller: Spouse Summary of Call: Triage Record Num: [id number] Operator: Albertine Grates Patient Name: [name] Call Date & Time: 12/23/2009 37:16:96VE Patient Phone: PCP: Kerby Nora Patient Gender: Female PCP Fax : Patient DOB: 12-26-1932 Practice Name: [facility] Reason for Call: Husband calling and states has had abdominal pain since 3-1. Pain is in center of abdomen. Pain is unbearable. Advised to call 911 but states husband is driving. Protocol(s) Used: Abdominal Pain / Discomfort Recommended Outcome per Protocol: Activate EMS 911 Reason for Outcome: Over 61 years of age AND new onset (first episode) unbearable back or abdominal pain Care Advice:  ~ 03/ Initial call taken by: Melody Comas,  December 24, 2009 8:59 AM  Follow-up for Phone Call        Incomplete..reenter.  This is all of the message that I received. Melody Comas  December 24, 2009 9:29 AM  Please let phyllis know and try to obtain rest on message..? care advice 3/....looks incomplete. Kerby Nora MD  December 24, 2009 10:12 AM    This call report is icomplete. I sent Jamesetta So my email.  Melody Comas  December 24, 2009 10:55 AM

## 2010-11-26 NOTE — Letter (Signed)
Summary: [facility] Instructions  [facility]  [address] [place], [place] [address]   Phone: [phone]  Fax: [phone]       [name]    1933-01-28    MRN: [id number]       Procedure Day /Date: Friday 01/16/2010     Arrival Time: 9:30 am     Procedure Time: 10:30 am     Location of Procedure:                    _ x_  [facility] (4th Floor)   PREPARATION FOR COLONOSCOPY WITH MIRALAX  Starting 5 days prior to your procedure Sunday 3/20 do not eat nuts, seeds, popcorn, corn, beans, peas,  salads, or any raw vegetables.  Do not take any fiber supplements (e.g. Metamucil, Citrucel, and Benefiber). ____________________________________________________________________________________________________   THE DAY BEFORE YOUR PROCEDURE         DATE: Thursday 3/24 1   Drink clear liquids the entire day-NO SOLID FOOD  2   Do not drink anything colored red or purple.  Avoid juices with pulp.  No orange juice.  3   Drink at least 64 oz. (8 glasses) of fluid/clear liquids during the day to prevent dehydration and help the prep work efficiently.  CLEAR LIQUIDS INCLUDE: Water Jello Ice Popsicles Tea (sugar ok, no milk/cream) Powdered fruit flavored drinks Coffee (sugar ok, no milk/cream) Gatorade Juice: apple, white grape, white cranberry  Lemonade Clear bullion, consomm, broth Carbonated beverages (any kind) Strained chicken noodle soup Hard Candy  4   Mix the entire bottle of Miralax with 64 oz. of Gatorade/Powerade in the morning and put in the refrigerator to chill.  5   At 3:00 pm take 2 Dulcolax/Bisacodyl tablets.  6   At 4:30 pm take one Reglan/Metoclopramide tablet.  7  Starting at 5:00 pm drink one 8 oz glass of the Miralax mixture every 15-20 minutes until you have finished drinking the entire 64 oz.  You should finish drinking prep around 7:30 or 8:00 pm.  8   If you are nauseated, you may take the 2nd Reglan/Metoclopramide tablet at  6:30 pm.        9    At 8:00 pm take 2 more DULCOLAX/Bisacodyl tablets.     THE DAY OF YOUR PROCEDURE      DATE:  Friday 3/25  You may drink clear liquids until 8:30 am  (2 HOURS BEFORE PROCEDURE).   MEDICATION INSTRUCTIONS  Unless otherwise instructed, you should take regular prescription medications with a small sip of water as early as possible the morning of your procedure.  Additional medication instructions:  do not take HCTZ morning of procedure.         OTHER INSTRUCTIONS  You will need a responsible adult at least 75 years of age to accompany you and drive you home.   This person must remain in the waiting room during your procedure.  Wear loose fitting clothing that is easily removed.  Leave jewelry and other valuables at home.  However, you may wish to bring a book to read or an iPod/MP3 player to listen to music as you wait for your procedure to start.  Remove all body piercing jewelry and leave at home.  Total time from sign-in until discharge is approximately 2-3 hours.  You should go home directly after your procedure and rest.  You can resume normal activities the day after your procedure.  The day of your procedure  you should not:   Drive   Make legal decisions   Operate machinery   Drink alcohol   Return to work  You will receive specific instructions about eating, activities and medications before you leave.   The above instructions have been reviewed and explained to me by  Ezra Sites RN  January 08, 2010 11:45 AM      I fully understand and can verbalize these instructions _____________________________ Date _______

## 2010-11-26 NOTE — Letter (Signed)
Summary: Colonoscopy Letter  [facility]  [address] [place], [place] [address]   Phone: [phone]  Fax: [phone]      December 17, 2009 MRN: [id number]   [facility] [address] [place], [place]  [address]   Dear Ms. [name],   According to your medical record, it is time for you to schedule a Colonoscopy. The American Cancer Society recommends this procedure as a method to detect early colon cancer. Patients with a family history of colon cancer, or a personal history of colon polyps or inflammatory bowel disease are at increased risk.  This letter has beeen generated based on the recommendations made at the time of your procedure. If you feel that in your particular situation this may no longer apply, please contact our office.  Please call our office at [phone] to schedule this appointment or to update your records at your earliest convenience.  Thank you for cooperating with [place] to provide you with the very best care possible.   Sincerely,  Hedwig Morton. Juanda Chance, M.D.  [facility] [phone]

## 2010-11-26 NOTE — Letter (Signed)
[facility]   Im[facility] By: Sherian Rein 05/25/2010 08:05:33  _____________________________________________________________________  External Attachment:    Type:   Image     Comment:   External Document

## 2010-11-26 NOTE — Letter (Signed)
[facility]   Imported By: Lanelle Bal 11/22/2009 10:48:50  _____________________________________________________________________  External Attachment:    Type:   Image     Comment:   External Document  Appended Document: Orders Update Notify pt that after reviewing Ct abd and pelvis from [facility]...appears there is some fluid in endometrail cavity..unknown significance.  I do not see that she has had a gynecologist in past We can eval further with vaginal exam aty time of her upcoming 12/2009 CPX and transvaginal ultrasound  of refer her to a GYN for their opinion prior to work up....her choice. Kerby Nora MD  November 24, 2009 2:32 PM    Clinical Lists Changes  Problems: Added new problem of NONSPECIFIC ABN FINDING RAD & OTH EXAM GU ORGAN (ICD-793.5) - endometrial fluid on 10/2009 CT abd/pelvis - Signed Orders: Added new Referral order of Radiology Referral (Radiology) - Signed        Patient would do whatever you think is best.Heather Humberto Leep CMA   I will refer her for [place]...if normal..will wait until 12/2009 CPX for vaginal exam...if any abnormality given her recent history..will refer to GYN.  order sent to Henrine Screws MD  November 24, 2009 3:08 PM   Patient Advised.   Please send [place] order to [facility].  Lugene Fuquay CMA (AAMA)  November 25, 2009 3:58 PM

## 2010-11-26 NOTE — Progress Notes (Signed)
Summary: uti  Phone Note Call from Patient Call back at Home Phone [phone]   Caller: Patient Call For: Kerby Nora MD Summary of Call: Patient is saying that she has a uti. She says that she has burning and some frequency. She says that she can't come in for office visit and that she had a really bad uti back in december and doesn't want it to get that bad again because she has just gone through colon cancer. She is asking if she could have cipro called in to [facility] on [address]. Please advise.  Initial call taken by: Melody Comas,  June 15, 2010 1:50 PM  Follow-up for Phone Call        This is not ideal and could end up with harm being done to the patient.  Against my practice pattern to treat over the phone. Request minimally a urine sample brought to office to send for culture.  She has been on many antibiotics -- if highly resistant bug, can be very dangerous to blindly treat.   Cipro 500 mg, 1 by mouth two times a day x 10 days, #20 Follow-up by: Hannah Beat MD,  June 15, 2010 1:58 PM  Additional Follow-up for Phone Call Additional follow up Details #1::        patient will bring in sample tomorrow b/c she is babysitting today.Consuello Masse CMA   Additional Follow-up by: Benny Lennert CMA Duncan Dull),  June 15, 2010 2:08 PM    New/Updated Medications: CIPRO 500 MG TABS (CIPROFLOXACIN HCL) take one tablet 2 times daily Prescriptions: CIPRO 500 MG TABS (CIPROFLOXACIN HCL) take one tablet 2 times daily  #20 x 0   Entered by:   Benny Lennert CMA (AAMA)   Authorized by:   Hannah Beat MD   Signed by:   Benny Lennert CMA (AAMA) on 06/15/2010   Method used:   Electronically to        [facility] [address] (retail)       [address]       [place], [place]  [address]       Ph: [phone]       Fax: [phone]   RxID:   [id number]

## 2010-11-26 NOTE — Progress Notes (Signed)
Summary: pt is still having diarrhea  Phone Note Call from Patient Call back at Home Phone [phone] Call back at [phone]   Caller: Patient Summary of Call: Pt states she is taking levaquin for a UTI and she says this is causing really bad diarrhea.  She has recently had colon cancer surgery so she doesnt want to have the diarrhea.  She is asking that something else be called to [facility] in [place].  Please call pt and let her know. Initial call taken by: Lowella Petties CMA,  October 27, 2009 4:39 PM  Follow-up for Phone Call        Given she has been on several antibiotics lately need to bring in stool samples x 3  to check for C difficile Dx 787.91 .Marland Kitchencolon infection that can be caused by antibitoics.  Until that is negative..I recommend pushing fluids...can cause more problems than good with over the counter and prescription anti diarrheals if that infection is present.   Follow-up by: Kerby Nora MD,  October 27, 2009 5:27 PM  Additional Follow-up for Phone Call Additional follow up Details #1::        Patient states that she can get the diarrhea stopped with Imodium but every time she tries to take another Levaquin, it starts all over again.  She is asking if she could get another ABX for the UTI rather than Levaquin? Additional Follow-up by: Delilah Shan CMA (AAMA),  October 28, 2009 8:57 AM  New Problems: DIARRHEA (ICD-787.91)   Additional Follow-up for Phone Call Additional follow up Details #2::    was she having the same prob with cipro she was on previously?   Additional Follow-up for Phone Call Additional follow up Details #3:: Details for Additional Follow-up Action Taken: no cipro did not givew her any adverse effects  patient advised.Consuello Masse CMA  Additional Follow-up by: Benny Lennert CMA Duncan Dull),  October 28, 2009 12:18 PM  New Problems: DIARRHEA (ICD-787.91) New/Updated Medications: CIPROFLOXACIN HCL 750 MG TABS (CIPROFLOXACIN HCL) 1 tab by mouth  two times a day x 14 days Prescriptions: CIPROFLOXACIN HCL 750 MG TABS (CIPROFLOXACIN HCL) 1 tab by mouth two times a day x 14 days  #28 x 0   Entered and Authorized by:   Kerby Nora MD   Signed by:   Kerby Nora MD on 10/28/2009   Method used:   Electronically to        [facility] [address] (retail)       [address]       [place], [place]  [address]       Ph: [phone]       Fax: [phone]   RxID:   [id number]

## 2010-11-26 NOTE — Op Note (Signed)
Summary: [facility]  [facility]   Imported By: Sherian Rein 05/25/2010 07:42:32  _____________________________________________________________________  External Attachment:    Type:   Image     Comment:   External Document

## 2010-11-26 NOTE — Letter (Signed)
[facility]   Imported By: Lanelle Bal 03/30/2010 10:36:30  _____________________________________________________________________  External Attachment:    Type:   Image     Comment:   External Document

## 2010-11-26 NOTE — Assessment & Plan Note (Signed)
  Nurse Visit   Allergies: 1)  ! Benadryl Laboratory Results   Urine Tests  Date/Time Received: June 15, 2010 3:27 PM  Date/Time Reported: June 15, 2010 3:27 PM   Routine Urinalysis   Color: orange Appearance: Clear Glucose: negative   (Normal Range: Negative) Bilirubin: negative   (Normal Range: Negative) Ketone: negative   (Normal Range: Negative) Blood: negative   (Normal Range: Negative) Protein: negative   (Normal Range: Negative) Urobilinogen: 0.2   (Normal Range: 0-1) Nitrite: negative   (Normal Range: Negative) Leukocyte Esterace: negative   (Normal Range: Negative)    Comments: uable to get clear reading b/c patient used azo    Orders Added: 1)  UA Dipstick w/o Micro (automated)  [81003] 2)  T-Culture, Urine [[id number]] 3)  Specimen Handling [99000]

## 2010-11-27 NOTE — Letter (Signed)
Summary: [facility]   [facility]   Imported By: Maryln Gottron 09/18/2010 10:12:49  _____________________________________________________________________  External Attachment:    Type:   Image     Comment:   External Document

## 2010-12-03 ENCOUNTER — Encounter: Payer: Self-pay | Admitting: Family Medicine

## 2010-12-16 ENCOUNTER — Encounter: Payer: Self-pay | Admitting: Family Medicine

## 2010-12-16 NOTE — Letter (Signed)
[facility]   Imported By: Kassie Mends 12/11/2010 08:55:26  _____________________________________________________________________  External Attachment:    Type:   Image     Comment:   External Document

## 2010-12-23 ENCOUNTER — Encounter: Payer: Self-pay | Admitting: Family Medicine

## 2010-12-28 ENCOUNTER — Telehealth: Payer: Self-pay | Admitting: Family Medicine

## 2011-01-05 NOTE — Progress Notes (Signed)
Summary: refill request for xanax  Phone Note Refill Request Message from:  Patient  Refills Requested: Medication #1:  ALPRAZOLAM 0.5 MG TBDP 1/2 tab by mouth two times a day as needed anxiety Pt needs refills sent [facility] [place].  She says there were irregularities seen on her 6 month colon cancer scan, and she is anxious.  Initial call taken by: Lowella Petties CMA, AAMA,  December 28, 2010 9:59 AM  Follow-up for Phone Call        Rx called to pharmacy Follow-up by: Benny Lennert CMA Duncan Dull),  December 28, 2010 1:24 PM    Prescriptions: ALPRAZOLAM 0.5 MG TBDP (ALPRAZOLAM) 1/2 tab by mouth two times a day as needed anxiety  #30 x 0   Entered and Authorized by:   Kerby Nora MD   Signed by:   Kerby Nora MD on 12/28/2010   Method used:   Telephoned to ...       [facility] [address] (retail)       [address]       [place], [place]  [address]       Ph: [phone]       Fax: [phone]   RxID:   [id number]

## 2011-01-17 LAB — COMPREHENSIVE METABOLIC PANEL
ALT: 20 U/L (ref 0–35)
AST: 21 U/L (ref 0–37)
Alkaline Phosphatase: 77 U/L (ref 39–117)
CO2: 34 mEq/L — ABNORMAL HIGH (ref 19–32)
Calcium: 10 mg/dL (ref 8.4–10.5)
GFR calc Af Amer: >60 mL/min (ref >60)
GFR calc non Af Amer: >60 mL/min (ref >60)
Glucose, Bld: 161 mg/dL — ABNORMAL HIGH (ref 70–99)
Potassium: 3.6 mEq/L (ref 3.5–5.1)
Sodium: 140 mEq/L (ref 135–145)
Total Protein: 6.9 g/dL (ref 6.0–8.3)

## 2011-01-17 LAB — URINALYSIS, ROUTINE W REFLEX MICROSCOPIC
Glucose, UA: NEGATIVE mg/dL
Ketones, ur: >80 mg/dL — AB
Nitrite: NEGATIVE
Protein, ur: NEGATIVE mg/dL

## 2011-01-17 LAB — CBC
Hemoglobin: 14.9 g/dL (ref 12.0–15.0)
MCHC: 33.8 g/dL (ref 30.0–36.0)
RBC: 4.8 MIL/uL (ref 3.87–5.11)

## 2011-01-17 LAB — DIFFERENTIAL
Basophils Relative: 0 % (ref 0–1)
Eosinophils Absolute: 0 10*3/uL (ref 0.0–0.7)
Eosinophils Relative: 0 % (ref 0–5)
Lymphs Abs: 0.9 10*3/uL (ref 0.7–4.0)
Monocytes Relative: 7 % (ref 3–12)

## 2011-01-19 ENCOUNTER — Encounter: Payer: Self-pay | Admitting: Family Medicine

## 2011-01-20 ENCOUNTER — Ambulatory Visit: Payer: Self-pay | Admitting: Family Medicine

## 2011-01-22 ENCOUNTER — Other Ambulatory Visit: Payer: Self-pay | Admitting: Family Medicine

## 2011-01-27 ENCOUNTER — Telehealth: Payer: Self-pay | Admitting: *Deleted

## 2011-01-27 NOTE — Telephone Encounter (Signed)
Patient advised as instructed via telephone.  [facility]

## 2011-01-27 NOTE — Telephone Encounter (Signed)
Referral for [place] sent

## 2011-01-27 NOTE — Telephone Encounter (Signed)
Receptionist from Dr. Tomma Rakers office at [facility] called and says that Dr. Abigail Miyamoto is suggesting that patient have a gallbladder ultrasound done. She is asking if it could be ordered through you so that patient doesn't have to go back to [facility]. Please advise.

## 2011-01-27 NOTE — Telephone Encounter (Signed)
This seems reasonable - to assist with continuity and f/u will forward to [facility] to order.   Herbert Seta, can you let the patient know we will be setting this up soon and expect a call from [facility]

## 2011-01-28 ENCOUNTER — Ambulatory Visit: Payer: Self-pay | Admitting: Family Medicine

## 2011-01-28 ENCOUNTER — Telehealth: Payer: Self-pay | Admitting: *Deleted

## 2011-01-28 NOTE — Telephone Encounter (Signed)
Gallbladder was normal  Did find cystic areas within liver, largest one measuring 3.62 cm

## 2011-01-28 NOTE — Telephone Encounter (Signed)
Forward to Dr. Ermalene Searing

## 2011-01-28 NOTE — Telephone Encounter (Signed)
Please let pt know preliminary results (nml gallbladder eval, several cystic areas in liver) and forward results to the MD that requested her eval done... MD from [facility], ask pt for contact info.

## 2011-02-01 LAB — URINALYSIS, ROUTINE W REFLEX MICROSCOPIC
Bilirubin Urine: NEGATIVE
Glucose, UA: NEGATIVE mg/dL
Hgb urine dipstick: NEGATIVE
Ketones, ur: NEGATIVE mg/dL
pH: 5.5 (ref 5.0–8.0)

## 2011-02-01 LAB — DIFFERENTIAL
Basophils Absolute: 0 10*3/uL (ref 0.0–0.1)
Basophils Relative: 0 % (ref 0–1)
Basophils Relative: 1 % (ref 0–1)
Eosinophils Absolute: 0.1 10*3/uL (ref 0.0–0.7)
Eosinophils Absolute: 0 10*3/uL (ref 0.0–0.7)
Monocytes Relative: 13 % — ABNORMAL HIGH (ref 3–12)
Neutro Abs: 3.6 10*3/uL (ref 1.7–7.7)
Neutrophils Relative %: 69 % (ref 43–77)
Neutrophils Relative %: 82 % — ABNORMAL HIGH (ref 43–77)

## 2011-02-01 LAB — COMPREHENSIVE METABOLIC PANEL
ALT: 33 U/L (ref 0–35)
Alkaline Phosphatase: 66 U/L (ref 39–117)
BUN: 4 mg/dL — ABNORMAL LOW (ref 6–23)
CO2: 29 mEq/L (ref 19–32)
Chloride: 106 mEq/L (ref 96–112)
Glucose, Bld: 122 mg/dL — ABNORMAL HIGH (ref 70–99)
Potassium: 3.7 mEq/L (ref 3.5–5.1)
Sodium: 139 mEq/L (ref 135–145)
Total Bilirubin: 0.7 mg/dL (ref 0.3–1.2)
Total Protein: 3.8 g/dL — ABNORMAL LOW (ref 6.0–8.3)

## 2011-02-01 LAB — CLOSTRIDIUM DIFFICILE EIA: C difficile Toxins A+B, EIA: NEGATIVE

## 2011-02-01 LAB — BASIC METABOLIC PANEL
BUN: 5 mg/dL — ABNORMAL LOW (ref 6–23)
BUN: 5 mg/dL — ABNORMAL LOW (ref 6–23)
CO2: 30 mEq/L (ref 19–32)
CO2: 32 mEq/L (ref 19–32)
CO2: 28 mEq/L (ref 19–32)
Calcium: 8.7 mg/dL (ref 8.4–10.5)
Chloride: 106 mEq/L (ref 96–112)
Chloride: 106 mEq/L (ref 96–112)
Creatinine, Ser: 0.49 mg/dL (ref 0.4–1.2)
Creatinine, Ser: 0.58 mg/dL (ref 0.4–1.2)
GFR calc Af Amer: >60 mL/min (ref >60)
GFR calc Af Amer: >60 mL/min (ref >60)
GFR calc non Af Amer: >60 mL/min (ref >60)
Glucose, Bld: 84 mg/dL (ref 70–99)
Glucose, Bld: 87 mg/dL (ref 70–99)
Potassium: 3.2 mEq/L — ABNORMAL LOW (ref 3.5–5.1)
Potassium: 3.3 mEq/L — ABNORMAL LOW (ref 3.5–5.1)
Potassium: 3.5 mEq/L (ref 3.5–5.1)
Sodium: 141 mEq/L (ref 135–145)

## 2011-02-01 LAB — URINE CULTURE
Colony Count: NO GROWTH
Culture: NO GROWTH

## 2011-02-01 LAB — CBC
HCT: 39.2 % (ref 36.0–46.0)
Hemoglobin: 13.1 g/dL (ref 12.0–15.0)
MCHC: 34.4 g/dL (ref 30.0–36.0)
MCV: 96.6 fL (ref 78.0–100.0)
Platelets: 151 10*3/uL (ref 150–400)
RBC: 4.05 MIL/uL (ref 3.87–5.11)
RDW: 19.1 % — ABNORMAL HIGH (ref 11.5–15.5)
WBC: 5.2 10*3/uL (ref 4.0–10.5)

## 2011-02-01 LAB — ELECTROLYTE PANEL: CO2: 29 mEq/L (ref 19–32)

## 2011-02-01 LAB — BRAIN NATRIURETIC PEPTIDE: Pro B Natriuretic peptide (BNP): <30 pg/mL (ref 0.0–100.0)

## 2011-02-01 LAB — URINE MICROSCOPIC-ADD ON

## 2011-02-01 LAB — URINALYSIS, DIPSTICK ONLY
Glucose, UA: NEGATIVE mg/dL
Ketones, ur: 15 mg/dL — AB
Leukocytes, UA: NEGATIVE
Nitrite: NEGATIVE
Specific Gravity, Urine: 1.016 (ref 1.005–1.030)
pH: 6 (ref 5.0–8.0)

## 2011-02-01 LAB — T4, FREE: Free T4: 1.14 ng/dL (ref 0.80–1.80)

## 2011-02-01 LAB — TSH: TSH: 1.923 u[IU]/mL (ref 0.350–4.500)

## 2011-02-02 NOTE — Telephone Encounter (Signed)
Spoke with nurse at [facility] and she said they already had report. Nurse([place])

## 2011-02-09 ENCOUNTER — Encounter: Payer: Self-pay | Admitting: Family Medicine

## 2011-02-12 ENCOUNTER — Telehealth: Payer: Self-pay | Admitting: *Deleted

## 2011-02-12 MED ORDER — DIPHENOXYLATE-ATROPINE 2.5-0.025 MG PO TABS: 1.0000 | ORAL_TABLET | Freq: Four times a day (QID) | ORAL | Status: DC | PRN

## 2011-02-12 NOTE — Telephone Encounter (Signed)
rx called to pharmacy 

## 2011-02-12 NOTE — Telephone Encounter (Signed)
Patient says that she has been having trouble with diarrhea since her colon cancer treatments. She is asking if she could get Rx for lomotil called in to [address]

## 2011-02-15 ENCOUNTER — Telehealth: Payer: Self-pay | Admitting: *Deleted

## 2011-02-15 NOTE — Telephone Encounter (Signed)
Pt called today complaining of diarrhea since having colon surgery.  I advised pt that this is more of a post op problem and advised her to call her surgeon.  Pt agreed.

## 2011-03-05 ENCOUNTER — Encounter: Payer: Self-pay | Admitting: Family Medicine

## 2011-03-05 ENCOUNTER — Ambulatory Visit (INDEPENDENT_AMBULATORY_CARE_PROVIDER_SITE_OTHER): Payer: Medicare Other | Admitting: Family Medicine

## 2011-03-05 DIAGNOSIS — R197 Diarrhea, unspecified: Secondary | ICD-10-CM

## 2011-03-05 DIAGNOSIS — I10 Essential (primary) hypertension: Secondary | ICD-10-CM

## 2011-03-05 DIAGNOSIS — E785 Hyperlipidemia, unspecified: Secondary | ICD-10-CM

## 2011-03-05 MED ORDER — ALPRAZOLAM 0.5 MG PO TABS: 0.2500 mg | ORAL_TABLET | Freq: Two times a day (BID) | ORAL | Status: DC | PRN

## 2011-03-05 NOTE — Progress Notes (Signed)
  Subjective:    Patient ID: [name], female    DOB: [birth year], 77 y.o.   MRN: [id number]  HPI  05/2009 colorectal suregery for rectal cancer 08/2009 reanastamosis Was doing great until..  Polyp found in small intestine on abd CT..verified with capsule endoscopy. 02/02/2011 Had surgery for removal of polyp as well as removal of stuck capsule. Pathology..showed no cancer  Colonoscopy showed no cancer in   Starting on 4/17 she began with diarrhea constantly.  Lomotil, immodium did not help. On 4/30 ish she had diarrhea that continued through the night. Went to ER at [facility]..gave IVF, nml blood work, full eval, Cdiff neg x 2. Given [place].  Diarrhea has resolved completely.  Now now abdominal pain, eating better. No N/V. Gaining weight back.    Review of Systems  Constitutional: Negative for fever and fatigue.  HENT: Negative for ear pain.   Eyes: Negative for pain.  Respiratory: Negative for chest tightness and shortness of breath.   Cardiovascular: Negative for chest pain, palpitations and leg swelling.  Gastrointestinal: Negative for abdominal pain.  Genitourinary: Negative for dysuria.       Objective:   Physical Exam  Constitutional: Vital signs are normal. She appears cachectic. She is cooperative.  Non-toxic appearance. She does not appear ill. No distress.  HENT:  Head: Normocephalic.  Right Ear: Hearing, tympanic membrane, external ear and ear canal normal. Tympanic membrane is not erythematous, not retracted and not bulging.  Left Ear: Hearing, tympanic membrane, external ear and ear canal normal. Tympanic membrane is not erythematous, not retracted and not bulging.  Nose: No mucosal edema or rhinorrhea. Right sinus exhibits no maxillary sinus tenderness and no frontal sinus tenderness. Left sinus exhibits no maxillary sinus tenderness and no frontal sinus tenderness.  Mouth/Throat: Uvula is midline, oropharynx is clear and moist and mucous membranes are  normal.  Eyes: Conjunctivae, EOM and lids are normal. Pupils are equal, round, and reactive to light. No foreign bodies found.  Neck: Trachea normal and normal range of motion. Neck supple. Carotid bruit is not present. No mass and no thyromegaly present.  Cardiovascular: Normal rate, regular rhythm, S1 normal, S2 normal, normal heart sounds, intact distal pulses and normal pulses.  Exam reveals no gallop and no friction rub.   No murmur heard. Pulmonary/Chest: Effort normal and breath sounds normal. Not tachypneic. No respiratory distress. She has no decreased breath sounds. She has no wheezes. She has no rhonchi. She has no rales.  Abdominal: Soft. Normal appearance and bowel sounds are normal. There is no tenderness.  Neurological: She is alert.  Skin: Skin is warm, dry and intact. No rash noted.  Psychiatric: Her speech is normal and behavior is normal. Judgment and thought content normal. Her mood appears not anxious. Cognition and memory are normal. She does not exhibit a depressed mood.          Assessment & Plan:  Pt not interested in setting up annual medicare wellness. Not interested in DEXA. Wants mammogram  Every 2 years.  Will get zostavax from pharmacy.

## 2011-03-05 NOTE — Patient Instructions (Signed)
Call when ready to schedule annual medicare wellness visit.  Get zostavax  pharmacy.

## 2011-03-05 NOTE — Assessment & Plan Note (Signed)
Resolved with [facility]. Work up complete.

## 2011-03-05 NOTE — Assessment & Plan Note (Signed)
Well controlled. Continue current medication.  

## 2011-03-09 NOTE — H&P (Signed)
NAMESHACOYA, BURKHAMMER                ACCOUNT NO.:  [id number]   MEDICAL RECORD NO.:  [id number]          PATIENT TYPE:  INP   LOCATION:  1402                         FACILITY:  [facility]   PHYSICIAN:  Leighton Roach. Truett Perna, M.D. DATE OF BIRTH:  [birth year]   DATE OF ADMISSION:  04/11/2009  DATE OF DISCHARGE:                              HISTORY & PHYSICAL   PATIENT IDENTIFICATION:  [name] is a 77 year old with a recent  diagnosis of rectal cancer.  She is now admitted with failure to thrive  and unexplained tachycardia.   HISTORY OF PRESENT ILLNESS:  [name] was diagnosed with rectal cancer  in March of this year.  A colonoscopy on March 11 confirmed a mass in  the rectum.  A biopsy confirmed adenocarcinoma.   Dr. Christella Hartigan performed an endoscopic ultrasound evaluation on March 30.  This revealed an ultrasound stage T3 N0 tumor at 6-7 cm from the anal  verge.   [name] was treated with neoadjuvant chemotherapy and radiation.  The  chemotherapy consisted of capecitabine.  The capecitabine was  discontinued on May 23 due to diarrhea.  She completed radiation on May  28.   She was seen for an office visit on June 4.  She was noted to have  hypotension and tachycardia.  She received intravenous fluids and the  tachycardia improved.   She had diarrhea during the chemotherapy and radiation.  This has  improved.  She reports approximately 2 loose stools per day at present.  She reports a good appetite and an adequate intake of fluids at present.  She has intermittent nausea but no emesis.  She denies bleeding.   She reports being evaluated in the emergency room on June 12 with  generalized weakness.  She received intravenous fluids.  She was  diagnosed with a urinary tract infection (E. coli) and she completed  treatment with ciprofloxacin.   She complains of generalized weakness.  She has noted progressive foot  and ankle edema over the past week.   She has been maintained off of her blood  pressure medications for the  past few weeks.   PAST MEDICAL HISTORY:  1. Hypertension.  2. G4, P3, 1 miscarriage.   PAST SURGICAL HISTORY:  None.   CURRENT MEDICATIONS:  1. Cardia XT 240 mg daily - currently on hold.  2. Hydrochlorothiazide 25 mg daily - currently on hold.  3. Advil 400 mg t.i.d. p.r.n.  4. Compazine 10 mg q.6 h. p.r.n.  5. Lomotil p.r.n.  6. Vicodin 5/500 q.4 h. p.r.n.  7. Ciprofloxacin 500 mg b.i.d. started on June 12.   ALLERGIES:  No known drug allergies.   FAMILY HISTORY:  Her father had 7 siblings.  Two had cancer.  An aunt  had colon cancer at age 51 and an uncle had cancer when he was  elderly.  No other family history of cancer.   SOCIAL HISTORY:  She lives with husband in [place].  She previously  worked in an office occupation.  She does not use tobacco or alcohol.  She has no transfusion history.  She denies  risk factors for HIV and  hepatitis.   REVIEW OF SYSTEMS:  CONSTITUTIONAL:  No fever.  RESPIRATORY:  Negative.  CARDIAC:  Negative.  GU:  Negative.  GI:  She has intermittent nausea  but no emesis.  She reports approximately 2 loose stools per day.  This  has improved since completing radiation.  NEUROLOGIC:  Negative.  SKIN:  Negative.  HEMATOLOGIC:  Negative.   PHYSICAL EXAMINATION:  VITAL SIGNS:  Blood pressure 134/92, pulse 138,  temperature 97, height 64 inches, weight 137.3 pounds (weight 130.4 on  June 4).  HEENT:  No thrush or ulcers.  The mucous membranes are moist.  LUNGS:  Clear bilaterally in the anterior and posterior lung fields.  No  respiratory distress.  CARDIAC:  Tachycardia with a regular rhythm.  An occasional premature  beats.  ABDOMEN:  Soft and nontender.  Bowel sounds are present.  No  hepatosplenomegaly.  EXTREMITIES:  There is 1+ edema at the low leg and  ankles bilaterally.  There is trace pitting edema at the thighs, arms,  and presacral areas.  NEUROLOGIC:  She is alert and oriented.   LABS:   Pending.   IMPRESSION:  1. Rectal cancer (uT3 uN0) status post neoadjuvant radiation and      Xeloda.  Radiation was completed on May 28 and Xeloda was      discontinued on May 23 due to diarrhea.  2. Diarrhea secondary to chemotherapy and radiation - improved.  3. History of tachycardia/hypotension over the past several weeks -      she has received intravenous fluids on several occasions.  She      continues to have tachycardia of unclear etiology.  She does not      appear dehydrated today.  4. Small indeterminate liver lesions on an abdominal CT scan January 03, 2009, most likely cysts.  5. History of hypertension - Cardia XT and hydrochlorothiazide are      currently on hold.  6. History of perineal skin breakdown secondary to radiation - she      reports the perineum has completely healed.  7. Pitting edema in the arms, legs, and presacral areas consistent      with anasarca.   [name] has completed neoadjuvant Xeloda and radiation for treatment of  an early stage rectal cancer.   She presents with failure to thrive, tachycardia, and new onset edema.  The etiology of her symptoms is unclear.  She does not appear dehydrated  today, but we will obtain orthostatic vital signs and a chemistry panel  to further evaluate for dehydration.   She will be placed on a monitor bed and we will obtain an EKG on  hospital admission.  The tachycardia could be related to a primary  cardiac process.   I have a low clinical suspicion for a pulmonary embolism.  She will be  placed on prophylactic Lovenox.  We will begin gentle intravenous  hydration with normal saline at a rate of 75 cc/hour.  We resumed Cardia  XT since the blood pressure is mildly elevated today with tachycardia.   We may request a transfer to the hospitalist service if the tachycardia  and her other symptoms do not appear related to the rectal cancer.      Leighton Roach Truett Perna, M.D.  Electronically Signed      GBS/MEDQ  D:  04/11/2009  T:  04/11/2009  Job:  [id number]   cc:   Kerby Nora, MD  Rachael Fee, MD  [address]  [place], [place] [address]   Hedwig Morton. Juanda Chance, MD  [address]  [place]  [place] [address]   Lorne Skeens. Hoxworth, M.D.  [address]  [place]  [place] [address]   Radene Gunning, MD, PhD  Fax: [phone]

## 2011-03-09 NOTE — Assessment & Plan Note (Signed)
[name], [name]                ACCOUNT NO.:  [id number]   MEDICAL RECORD NO.:  [id number]          PATIENT TYPE:  POB   LOCATION:  [facility] at [facility]         FACILITY:  [facility]   PHYSICIAN:  Catalina Antigua, MD     DATE OF BIRTH:  05-15-1933   DATE OF SERVICE:  12/09/2009                                  CLINIC NOTE   This is a 77 year old postmenopausal, G4, P3-0-1-2 with history of stage  III rectal cancer who presents today for evaluation of a uterine polyp.  The patient is currently without any complaints.  She denies any  abdominal pain or history of vaginal bleeding or abnormal discharge.  The patient states that uterine polyp was discovered on a followup CAT  scan regarding her rectal cancer where abnormality in the uterus was  discovered, followup ultrasound performed on December 03, 2009, showed a  small polyp.  The patient was referred by her general doctor, Dr. Domingo Pulse for GYN evaluation.   PAST MEDICAL HISTORY:  Significant for hypertension and severe reflux  arthritis and stage III rectal cancer.   PAST SURGICAL HISTORY:  Significant for laparoscopic proctectomy as well  as reversal of ileostomy in 2010.   PAST OBSTETRICAL HISTORY:  She has had 3 vaginal deliveries and one  miscarriage.  She lost one child secondary to an accident at the age of  17.   PAST GYN HISTORY:  She denies any cyst or fibroids or history of  abnormal Pap smears.   SOCIAL HISTORY:  She denies drinking, smoking, and use of illicit drug  use.  She is married and lives with her husband.   FAMILY HISTORY:  Significant for colon cancer in one of her aunts  diagnosed at the age of 64, otherwise no other history of cancer in the  family.  No other medical problems in her family.   MEDICATIONS:  Cardizem, hydrochlorothiazide, glucosamine, and  alprazolam.   ALLERGIES:  She has an allergy to Benadryl.   REVIEW OF SYSTEMS:  Otherwise significant for upper arm pain as well as  weight loss  secondary to her rectal cancer.   PHYSICAL EXAM:  VITAL SIGNS:  Blood pressure is 132/85, pulse is 91,  weight 121 pounds, height of 5 feet 4 inches.   The patient declined physical exam at this time, states that she is  scheduled for a physical exam next month with Dr. Ermalene Searing.  Ultrasound  performed on February 9 revealed a retroverted uterus measuring 6 x 2 x  3-cm endometrium and a small amount of fluid and a focal hyperechoic  polyp is seen in the anterior wall of the endometrial cavity, it  measures 4 x 2 x 6 mm.  No adnexal masses were identified.  The right  ovary appeared normal measuring 2 x 1.5 x 2 cm.  The left ovary was not  visualized.   ASSESSMENT/PLAN:  This is a 77 year old postmenopausal with history of  rectal cancer, status post radiation and chemotherapy treatments who  presents for evaluation of endometrial polyp discovered on followup CAT  scan.  Findings were explained to the patient, option of endometrial  biopsy to confirm the diagnosis of  a polyp versus operative hysteroscopy  with removal of the polyp with tissue diagnosis was discussed with the  patient.  The patient is not interested in any interventions for this  polyp at this time since she is asymptomatic.  The patient states that  she has gone through enough in the past year with her rectal cancer that  she is not interested in any more surgical intervention or any type of  manipulation at this time.  The patient was advised that carcinogenic  risk of endometrial polyp in a postmenopausal woman was approximately  5%.  The patient states that she is willing to take that risk given that  it is a small percentage and we will gladly return if she develops any  symptoms of postmenopausal bleeding.  The patient plans to continue  followup with Dr. Ermalene Searing and we will have her mammogram scheduled by  her at her next visit.  The patient will return on an as-needed basis.            ______________________________  Catalina Antigua, MD     PC/MEDQ  D:  12/09/2009  T:  12/10/2009  Job:  [id number]

## 2011-03-09 NOTE — Discharge Summary (Signed)
[name], [name]                ACCOUNT NO.:  [id number]   MEDICAL RECORD NO.:  [id number]          PATIENT TYPE:  INP   LOCATION:  1402                         FACILITY:  [facility]   PHYSICIAN:  Leighton Roach. Truett Perna, M.D. DATE OF BIRTH:  May 10, 1933   DATE OF ADMISSION:  04/11/2009  DATE OF DISCHARGE:  04/17/2009                               DISCHARGE SUMMARY   DISCHARGE DIAGNOSES:  1. Rectal cancer, status post neoadjuvant chemo and radiation therapy.  2. Diarrhea secondary to chemotherapy and radiation therapy.  3. Tachycardia, asymptomatic, question secondary to intravascular      depletion.  4. Lower extremity edema/anasarca likely secondary to hypoalbuminemia.  5. Hypokalemia, likely secondary to diarrhea and diuretic therapy.  6. History of hypertension.   CONSULTATIONS:  Dr. Berton Mount, Cardiology.   PROCEDURES:  1. A 2D echo, April 15, 2009 - Left ventricle cavity size was normal.      There was mild concentric hypertrophy.  Systolic function was      vigorous.  Estimated ejection fraction was in the range of 65% to      70%.  Wall motion was normal.  There were no regional wall motion      abnormalities.  2. CT angiography chest with contrast, April 15, 2009 - No evidence of      acute pulmonary embolism.  Small pleural effusions.  Osteopenia.      Partially compressed mid thoracic vertebral body, probably T7, of      uncertain age.  Suspect fatty infiltration of the liver.  3. Bilateral lower extremity venous Dopplers, April 17, 2009 - No      evidence of deep vein or superficial thrombosis involving the right      lower extremity or left lower extremity.  No evidence of Baker's      cyst on the right or left.   HPI:  [name] is a 77 year old woman with a recent diagnosis of rectal  cancer.  She was diagnosed with rectal cancer in March of this year.  Colonoscopy on January 02, 2009, confirmed a mass in the rectum.  Biopsy  confirmed adenocarcinoma.  Endoscopic ultrasound on  January 21, 2009,  revealed an ultrasound stage T3N0 tumor at 6 to 7 cm from the anal  verge.  Ms. Printz was treated with neoadjuvant chemotherapy and  radiation.  The chemotherapy consisted of Xeloda.  The Xeloda was  discontinued on Mar 16, 2009, due to diarrhea.  She completed the course  of radiation therapy Mar 21, 2009.  She was seen in the office on March 28, 2009, and was noted to be hypotensive and tachycardiac.  She received  IV fluids with improvement in the tachycardia.  She was evaluated in the  emergency department on April 05, 2009, with generalized weakness.  She  received IV fluids.  She was diagnosed with a urinary tract infection  (E. coli) and completed treatment with Cipro.   She was seen in the office on April 11, 2009 for followup.  At that time,  she complained of generalized weakness and progressive foot and ankle  edema over the past week.  She was again noted to be tachycardiac.  She  was admitted for further evaluation.   HOSPITAL COURSE:  [name] was admitted to a telemetry unit at [facility].  Admission labs showed hemoglobin 13.1, white count 5.2,  absolute neutrophil count 3.6, platelet count 186,000, sodium 139,  potassium 3.7, chloride 106, CO2 of 29, glucose 122, BUN 4, creatinine  0.52, bilirubin 0.7, alkaline phosphatase 66, SGOT 39, SGPT 33, total  protein 3.8, albumin 1.8, calcium 8.4, magnesium 1.9.  Urinalysis was  unremarkable.  Additional labs showed normal free T4 and TSH.  Intravenous hydration was initiated.  She was placed on Lovenox 40 mg  subcu daily.  She remained tachycardiac despite intravenous hydration.  Admission chest x-ray showed emphysema, subsegmental atelectasis at the  left lung base, possible small left pleural effusion.  A 2D echo was  done on April 15, 2009, with an estimated ejection fraction in the range  of 65% to 70%.  Wall motion was normal.  There were no regional wall  motion abnormalities.  Chest CT also done  April 15, 2009, to rule out a  PE.  There was no evidence of acute pulmonary embolism.  Small pleural  effusions were noted.  She was also noted to be osteopenic with a  partially compressed mid thoracic vertebral body, probably T7, of  uncertain age.  The radiologist also suspected fatty infiltration of the  liver.   [name] had persistent tachycardia of unclear etiology.  A Cardiology  consultation was requested.  Ms. Carrier was evaluated by Dr. Graciela Husbands.  No  additional cardiac evaluation was recommended.  Dr. Graciela Husbands recommended to  consider discontinuation of hydrochlorothiazide as he felt the edema was  likely related to hypoalbuminemia and the diuretic may have been  contributing to the tachycardia secondary to intravascular depletion.  Lower extremity Doppler studies were done to rule out DVT.  There was no  evidence of DVT bilaterally.   Ms. Legner continued to be tachycardiac throughout the hospitalization  with her heart rate ranging from the low 100s to the 120s.  She was  asymptomatic.  The tachycardia was felt to possibly be secondary to  relative intravascular depletion.   She appeared to be stable for discharge home on April 17, 2009.  She was  instructed to push fluids and call if she was experiencing diarrhea.   LABORATORY DATA:  April 17, 2009, sodium 140, potassium 3.2, chloride  106, CO2 of 30, glucose 87, BUN 5, creatinine 0.5, calcium 8.3.   DISPOSITION AT DISCHARGE:  1. Condition - Stable for discharge home.  2. Activity - Increase activity slowly.  3. Diet:  No restrictions.  4. Wound care:  Not applicable.  5. Special instructions:  Call with increased diarrhea, shortness of      breath, chest pain, fever greater than or equal to 101 degrees.  6. Followup:  Dr. Truett Perna - Office will call with an appointment      time.   DISCHARGE MEDICATIONS:  1. Cartia XT 240 mg daily.  2. K-Dur 40 mEq on April 18, 2009, and then 20 mEq daily.  3. Lomotil as needed.  4.  Compazine 10 mg every 6 hours as needed for nausea.  5. Vicodin 5/500 one tablet every 4 hours as needed for pain.  6. Advil 400 mg 3 times daily as needed.   She was instructed to discontinue hydrochlorothiazide.  This is the end  of dictation.  Lonna Cobb, N.P.      Leighton Roach. Truett Perna, M.D.  Electronically Signed    LT/MEDQ  D:  06/04/2009  T:  06/04/2009  Job:  [id number]

## 2011-03-09 NOTE — Consult Note (Signed)
[name], [name]                ACCOUNT NO.:  [id number]   MEDICAL RECORD NO.:  [id number]          PATIENT TYPE:  INP   LOCATION:  1402                         FACILITY:  [name]   PHYSICIAN:  Laurie Salvia, MD, FACCDATE OF BIRTH:  1933-08-05   DATE OF CONSULTATION:  04/16/2009  DATE OF DISCHARGE:                                 CONSULTATION   PRIMARY CARDIOLOGIST:  [name], being seen by Dr. Graciela [name].   PRIMARY CARE [name]:  Laurie Nora, MD   PATIENT PROFILE:  A 77 year old Caucasian female without prior cardiac  history who we are asked to evaluate for sinus tachycardia.   PROBLEMS:  1. Sinus tachycardia.  2. Failure to thrive.  3. Rectal cancer diagnosed in March 2010.      a.     Status post Xeloda chemotherapy, discontinued Mar 16, 2009.      b.     Status post radiation therapy, completed Mar 21, 2009.  4. Hypertension.  5. History of negative Myoview, March 2003, EF 65%.  6. Anasarca.      a.     April 15, 2009, 2D echocardiogram, EF 65-70%, no regional       wall motion abnormalities, trivial MR.   HISTORY OF PRESENT ILLNESS:  A 77 year old married Caucasian female  without prior cardiac history.  She was diagnosed with rectal cancer in  March 2010 and is status post chemotherapy, which was stopped Mar 16, 2009, with complications of diarrhea.  She completed radiation, Mar 21, 2009.  Throughout her chemotherapy and radiation, she had frequent  diarrhea and loose stools, up to 6 times or more per day.  This  persisted following completion of these therapies, and she reports as  little as a week ago she had 6 loose stools.   Approximately 2 weeks ago, she noted significant bilateral lower  extremity edema when she woke up one morning.  She was apparently placed  back on hydrochlorothiazide, which she had taken previously for  hypertension.  Unfortunately, she had minimal resolution of edema and  had progressive fatigue with poor appetite and ongoing  diarrhea.  She  was also noted to exhibit sinus tachycardia.  She was subsequently  admitted on June 18.  Since admission, she was initially hydrated but  over the past 3 days has had a net negative of approximately 1.6 L.  She  reports that her appetite remains poor mostly because she is not liking  the [name] food but that she does feel hungry.  She does not eat much  when she does eat.  Her albumin has been found to be 1.8 with total  protein of 3.8.  Due to ongoing tachycardia, an echocardiogram was  performed April 15, 2009, and showed normal LV function without regional  wall motion abnormalities.  She also underwent a CT of her chest, and  this was negative for pulmonary embolism.  She is completely  asymptomatic from a tachycardia standpoint and denies palpitations,  chest pain, shortness of breath, presyncope, syncope, PND, or orthopnea.  She continues to have mild lower extremity  edema up to the knee with  mild abdominal bloating.  Of note, she reports that at her baseline her  heart rate has been in the 90s as long as she can remember.  Heart rates  currently are between the mid 90s and low 100s.  Heart rates do come up  some with activity.   ALLERGIES:  No known drug allergies.   CURRENT MEDICATIONS:  1. Diltiazem CD 240 mg daily.  2. Hydrochlorothiazide 25 mg daily.  3. K-Dur 20 mEq daily.  4. Lovenox 40 mg subcu daily.   FAMILY HISTORY:  Mother died at age 47 of old age, although apparently  she had some TIAs as she aged.  Father died at 20 of old age.  She has a  brother who is aged 65 recently diagnosed with Parkinson's.  She has  another brother who is aged 45 and alive and well; a sister who is 64 is  alive and well.  There is no history of CAD, diabetes, or CVA in her  siblings.   SOCIAL HISTORY:  She lives in [place] with her husband on a [place]  farm.  They are both retired.  She denies tobacco, alcohol, or drug use.  She is very active in the garden  without significant symptoms or  limitations despite her illness.   REVIEW OF SYSTEMS:  Positive for fatigue, weakness, nausea, and  diarrhea, all as outlined in the HPI.  She is currently feeling much  much better with improved strength.  She has had rectal and perineal  pain and discomfort related to radiation in the past.  She is a full  code.  Otherwise, all systems reviewed and negative.   PHYSICAL EXAMINATION:  VITAL SIGNS:  Temperature 97.6, heart rate 103,  respirations 20, blood pressure 97/60, weight 66.4 kg, and pulse ox 96%  on room air.  Intake and output since June 21 shows a net negative of  about 1600 mL.  GENERAL:  A pleasant white female in no acute distress; awake, alert,  and oriented x3.  She has a normal affect.  HEENT:  Normal.  NEUROLOGIC:  Grossly intact and nonfocal.  SKIN:  Warm and dry without lesions or masses.  MUSCULOSKELETAL:  Grossly normal without deformity or effusion.  NECK:  Supple without bruits or JVD.  LUNGS:  Respirations regular and unlabored.  Clear to auscultation.  CARDIAC:  Tachycardic, S1 and S2.  No S3, S4, or murmurs.  ABDOMEN:  Round, soft, nontender, and nondistended.  Bowel sounds  present x4.  EXTREMITIES:  Warm, dry, pink with trace to 1+ bilateral lower extremity  edema, which is only mildly pitting.  This extends up to the knees and  down to the feet.  Dorsalis pedis and posterior tibial pulses are 2+ and  equal bilaterally.  There is no clubbing or cyanosis.   Chest x-ray, June 18, showed emphysema, question of small left pleural  effusion and left lung base subsegmental atelectasis.  Chest CT, June  22, showed no PE; small pleural effusion and osteopenia.  EKG shows  sinus tach at a rate of 126.  No ST-T changes.  Hemoglobin 13.1,  hematocrit 39.2, WBC 5.2, and platelets 186.  Sodium 141, potassium 3.5,  chloride 106, CO2 32, BUN 4, creatinine 0.55, glucose 84, TSH 1.923,  free T4 of 1.14, total protein 3.8, albumin 1.8,  calcium 9.4, and  magnesium 1.9.  C. diff was negative.   ASSESSMENT AND PLAN:  1. Sinus tachycardia.  She is  asymptomatic without complaints of chest      pain, dyspnea, palpitations, presyncope, or syncope.  CT is      negative for pulmonary embolism.  Thyroid function testing is      normal.  Echo shows normal LV function.  In the setting of rectal      cancer, recent chemotherapy and radiation, ongoing diarrhea, and      reduced appetite with a total protein of 3.8 and albumin of 1.8      would recommend continuing treatment of underlying causes, and we      would not plan any additional cardiac evaluation at this time.  She      is currently feeling much much better and was able to walk the      hallway today without significant weakness or fatigue.  Consider      discontinuation of hydrochlorothiazide as her edema is likely      related to her hypoalbuminemia, and diuretic may be contributing to      tachycardia secondary to intravascular depletion.  We will be happy      to see if additional      problems arise.  2. Hypokalemia. We will supplement.  She has been running between the      high 2s to 3.5 despite 20 mEq daily.  3. Lower extremity edema.  I suspect this is secondary to      hypoalbuminemia.      [name], ANP      Laurie Salvia, MD, [name]  Electronically Signed    [name]/MEDQ  D:  04/16/2009  T:  04/17/2009  Job:  [id number]

## 2011-03-12 ENCOUNTER — Ambulatory Visit: Payer: Medicare Other | Admitting: Family Medicine

## 2011-03-12 NOTE — Assessment & Plan Note (Signed)
[facility]                             [facility] OFFICE NOTE   NAME:[name], [name]                       MRN:          [id number]  DATE:09/08/2006                            DOB:          [birth year]    CHIEF COMPLAINT:  A 77 year old, white female here to establish new doctor.   HISTORY OF PRESENT ILLNESS:  [name] has been doing very well and had been  seen by Dr. Ruthine [name] at [facility] and more recently by Dr. Laury [name].  She  is changing to our office because it is much closer to our home.  She has no  acute concerns at this point in time.   CHRONIC ISSUES:  1. Hypertension, well-controlled.  She was diagnosed several years ago      with a large workup to look for possible causes which was negative.      She feels that her high blood pressure came from stress that she was      having that year.  She takes Cardizem 240 mg daily and      hydrochlorothiazide 12.5 mg daily.  She denies chest pain or shortness      of breath.  2. Osteopenia.  She states that she did have a DEXA scan done 1-2 years      ago that showed a susceptibility to osteoporosis.  She takes calcium      and vitamin D daily.  She gets regular exercise.  Her previous doctor      recommended a bisphosphonate, but she would like to avoid this      secondary to side effects and not being able to lay down for 30 minutes      after taking it.  She would like to try herbal medications instead.      She is going to look into this and will let me know what she is      interested in taking.  I will make sure it is okay with her other      medications.   REVIEW OF SYSTEMS:  CONSTITUTIONAL:  No headache, no dizziness or syncope.  HEENT:  She wears reading glasses.  No hearing problems.  CARDIOPULMONARY:  No palpitations, no chest pain, no dyspnea, no nausea, vomiting, diarrhea  constipation or rectal bleeding.   PAST MEDICAL HISTORY:  1. Hypertension.  2. Peripheral edema.  3.  Osteoarthritis of the hands.  4. Osteopenia.   PAST SURGICAL HISTORY:  1. DEXA scan possibly in 2006.  2. Mammogram negative in 2005.  3. Colonoscopy in 2006, showing polyps.  Recommend repeat in 3 years.   FAMILY HISTORY:  Father deceased at age 57 from old age.  Mother deceased at  age 2, but previously had a stroke.  She has two brothers who are healthy.  One sister who is healthy.  There is no family history of heart disease or  diabetes.  Her mother and brother did have arthritis.  Her sister had some  mild depression.  There is no family history of any type of cancer.   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  She, as a hobby, has as vintage Sports administrator.  She  has been married x50 years.  She has two daughters who are healthy and one  son who died in a motor vehicle accident.  She walks 1 mile daily.  She eats  fruits, vegetables and a balanced diet including lots of water.   PHYSICAL EXAMINATION:  VITAL SIGNS:  Height 64 inches, weight 137, blood  pressure 108/80, pulse 80, temperature 97.9.  GENERAL:  Thin, younger than stated age appearing female in no apparent  distress.  HEENT:  PERRLA.  Extraocular movements intact.  Oropharynx clear.  Tympanic  membranes clear.  Nares clear.  No thyromegaly, no lymphadenopathy.  CARDIAC:  Regular rate and rhythm with no murmurs, rubs or gallops.  Normal  PMI, 2+ peripheral pulses.  LUNGS:  Clear to auscultation bilaterally with no wheezes, rales or rhonchi.  ABDOMEN:  Soft, nontender, normoactive bowel sounds.  No hepatosplenomegaly.  MUSCULOSKELETAL:  Strength 5/5 in upper and lower extremities.  Good range  of motion in shoulders, hips and knees.  She does have some enlarged PIP  joints secondary to osteoarthritis.  NEUROLOGIC:  Alert and oriented x3.  Cranial nerves 2-12 grossly intact.  She did have a couple of times when she got confused about the year, but  formal Mini-Mental  Status Exam was not and all her other answers were  reasonable.   ASSESSMENT/PLAN:  1. Hypertension, well-controlled.  Continue hydrochlorothiazide and      Cardizem.  2. Osteopenia.  I will get records from her previous doctor to determine      when a DEXA scan was done last.  She was encouraged to take calcium and      vitamin D 600 mg twice a day.  She will let me know about possible      herbal medicine she would like to try.  I will also look into any other      type of medicines that we can use.  She will continue to exercise.  3. Prevention.  It appears that she may be due for a mammogram.  We will      no longer do Pap smears given her age.  She refused a flu vaccine      today.  I will evaluate when labs were done last and she may be due for      a physical exam sometime in the upcoming months.  I will contact her      with prevention that needs to be updated.     Laurie Nora, MD    AB/MedQ  DD: 09/08/2006  DT: 09/08/2006  Job #: [id number]

## 2011-03-19 ENCOUNTER — Other Ambulatory Visit: Payer: Self-pay | Admitting: *Deleted

## 2011-03-19 MED ORDER — CHOLESTYRAMINE 4 G PO PACK: 1.0000 | PACK | Freq: Three times a day (TID) | ORAL | Status: DC

## 2011-05-14 ENCOUNTER — Other Ambulatory Visit: Payer: Self-pay | Admitting: *Deleted

## 2011-05-14 MED ORDER — DIPHENOXYLATE-ATROPINE 2.5-0.025 MG PO TABS: 1.0000 | ORAL_TABLET | Freq: Four times a day (QID) | ORAL | Status: DC | PRN

## 2011-05-14 NOTE — Telephone Encounter (Signed)
rx called to [facility] pharmacy

## 2011-05-19 ENCOUNTER — Ambulatory Visit (INDEPENDENT_AMBULATORY_CARE_PROVIDER_SITE_OTHER): Payer: Medicare Other | Admitting: Family Medicine

## 2011-05-19 ENCOUNTER — Encounter: Payer: Self-pay | Admitting: Family Medicine

## 2011-05-19 DIAGNOSIS — R197 Diarrhea, unspecified: Secondary | ICD-10-CM

## 2011-05-19 DIAGNOSIS — C20 Malignant neoplasm of rectum: Secondary | ICD-10-CM

## 2011-05-19 NOTE — Progress Notes (Signed)
  Subjective:    Patient ID: [name], female    DOB: 03/13/1933, 77 y.o.   MRN: [id number]  HPI  77 year old female with history of rectal cancer...most recent surgery 01/2011 was removal of 8 inches of small intestine for polyp, capsule got stuck. Results of this benign.  Since then diarrhea had been constant.  She did have ER visit for diarrhea... Cdiff and stool culture negative. Started on questran which helped a lot. Also using immodium and lomotil. Diarrhea is currently now well controlled. BMs 7-8 times a day but firm stools. Back to fairly normal diet. Good appetite.  She is concerned because she is not gaining weight, has not lost any weight. She cannot get in touch with her [facility] doctors.     Review of Systems  Constitutional: Negative for fever and fatigue.  HENT: Negative for ear pain.   Eyes: Negative for pain.  Respiratory: Negative for chest tightness and shortness of breath.   Cardiovascular: Negative for chest pain, palpitations and leg swelling.  Gastrointestinal: Negative for abdominal pain.  Genitourinary: Negative for dysuria.       Objective:   Physical Exam  Constitutional: Vital signs are normal. She appears well-developed and well-nourished. She is cooperative.  Non-toxic appearance. She does not appear ill. No distress.       Thin appearing female   HENT:  Right Ear: Hearing, tympanic membrane and ear canal normal. Tympanic membrane is not erythematous, not retracted and not bulging.  Left Ear: Hearing, tympanic membrane and ear canal normal. Tympanic membrane is not erythematous, not retracted and not bulging.  Nose: No mucosal edema or rhinorrhea. Right sinus exhibits no maxillary sinus tenderness and no frontal sinus tenderness. Left sinus exhibits no maxillary sinus tenderness and no frontal sinus tenderness.  Mouth/Throat: Uvula is midline and mucous membranes are normal.  Eyes: EOM and lids are normal. No foreign bodies found.  Neck:  Trachea normal and normal range of motion. Neck supple. Carotid bruit is not present. No mass and no thyromegaly present.  Cardiovascular: Normal rate, regular rhythm, S1 normal, S2 normal, normal heart sounds, intact distal pulses and normal pulses.  Exam reveals no gallop and no friction rub.   No murmur heard. Pulmonary/Chest: Effort normal and breath sounds normal. Not tachypneic. No respiratory distress. She has no decreased breath sounds. She has no wheezes. She has no rhonchi. She has no rales.  Abdominal: Soft. Normal appearance and bowel sounds are normal. There is no tenderness.  Neurological: She is alert.  Skin: Skin is warm, dry and intact. No rash noted.  Psychiatric: Her speech is normal and behavior is normal. Judgment and thought content normal. Her mood appears not anxious. Cognition and memory are normal. She does not exhibit a depressed mood.          Assessment & Plan:

## 2011-05-19 NOTE — Patient Instructions (Signed)
Add supplement to nighttime milkshake. Try to add additional supplement. Continue questran.  Give more time from surgery for improvement. Follow up in September with surgeon.

## 2011-06-14 NOTE — Assessment & Plan Note (Signed)
Chronic, post operative diarrhea... Well controlled  on questran, but not weight gain.  Discussed healthy diet, encouraged supplement increase, also given more time  Post op for recovery.

## 2011-06-20 ENCOUNTER — Other Ambulatory Visit: Payer: Self-pay | Admitting: Family Medicine

## 2011-08-02 ENCOUNTER — Other Ambulatory Visit: Payer: Self-pay | Admitting: *Deleted

## 2011-08-02 MED ORDER — ALPRAZOLAM 0.5 MG PO TABS: 0.2500 mg | ORAL_TABLET | Freq: Two times a day (BID) | ORAL | Status: DC | PRN

## 2011-08-02 NOTE — Telephone Encounter (Signed)
rx called to pharmacy 

## 2011-08-18 IMAGING — US US PELVIS COMPLETE
1 series · 13 of 25 positions shown · non-contrast
Comparison: None.

CLINICAL DATA: Post menopausal female with abnormal uterus seen on
outside CT.  Rectal carcinoma.

TRANSABDOMINAL AND TRANSVAGINAL ULTRASOUND OF PELVIS
TECHNIQUE: Both transabdominal and transvaginal ultrasound
examinations of the pelvis were performed including evaluation of
the uterus, ovaries, adnexal regions, and pelvic cul-de-sac.

[Series 1: us pelvis complete · 0.26mm/px · 13 of 50 slices shown]
[im 1/50]
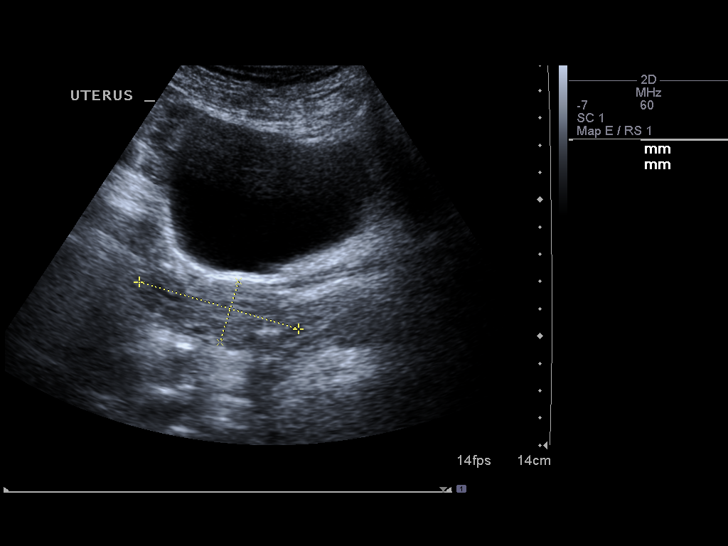
[im 5/50]
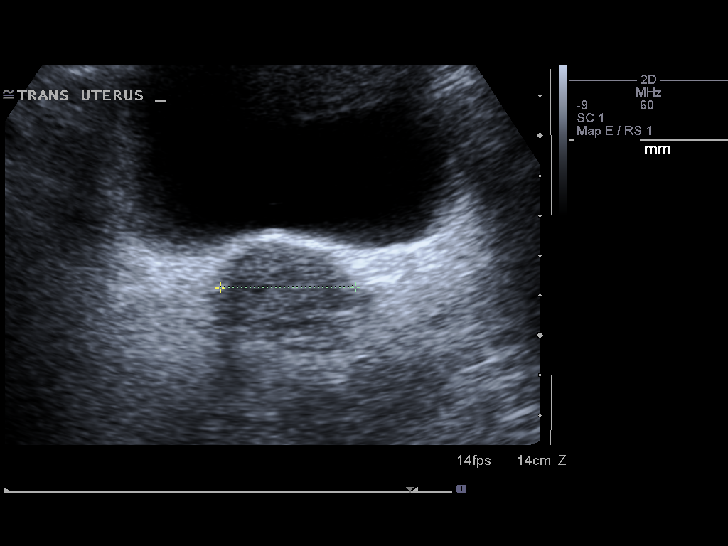
[im 9/50]
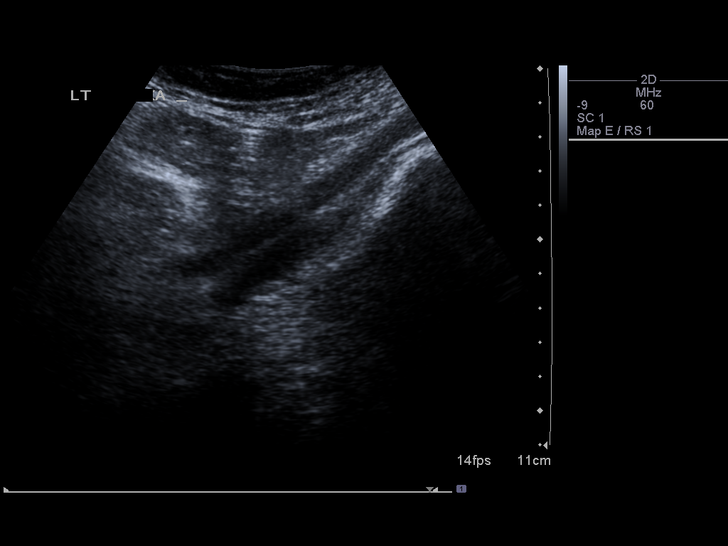
[im 13/50]
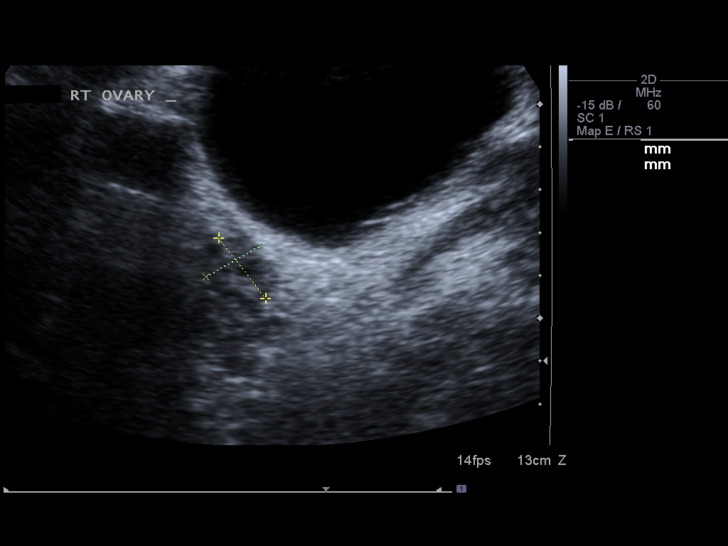
[im 17/50]
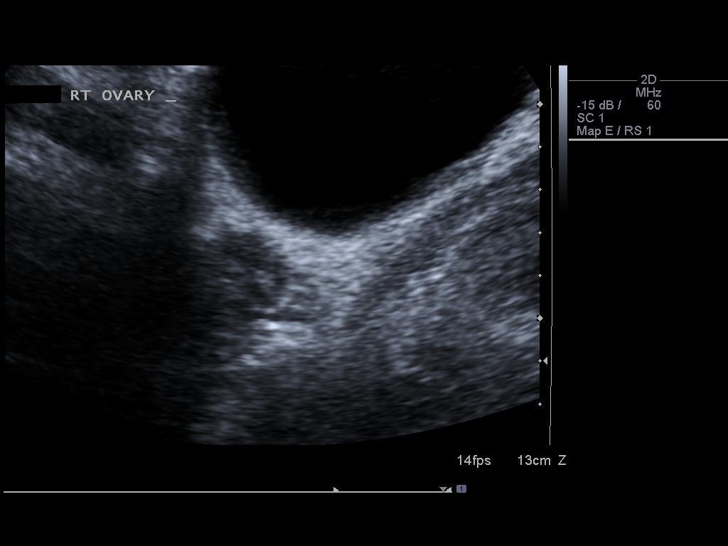
[im 21/50]
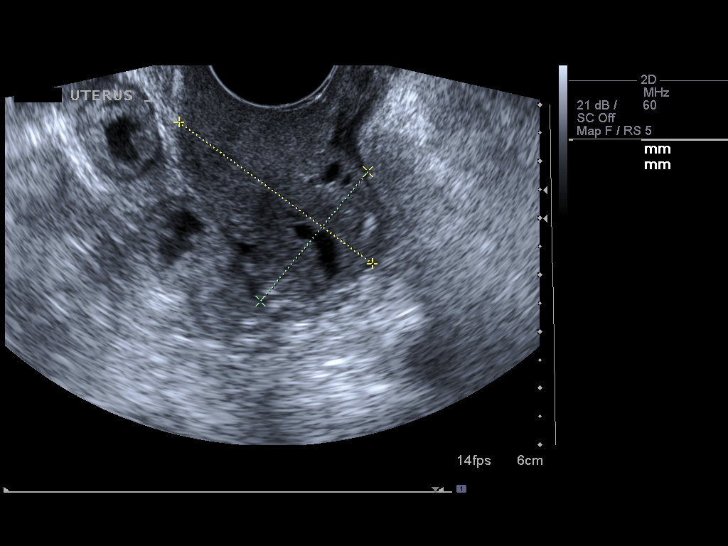
[im 25/50]
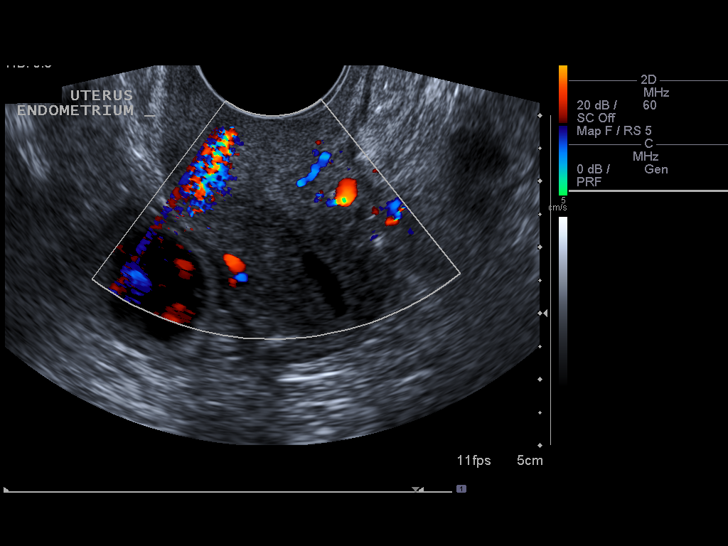
[im 29/50]
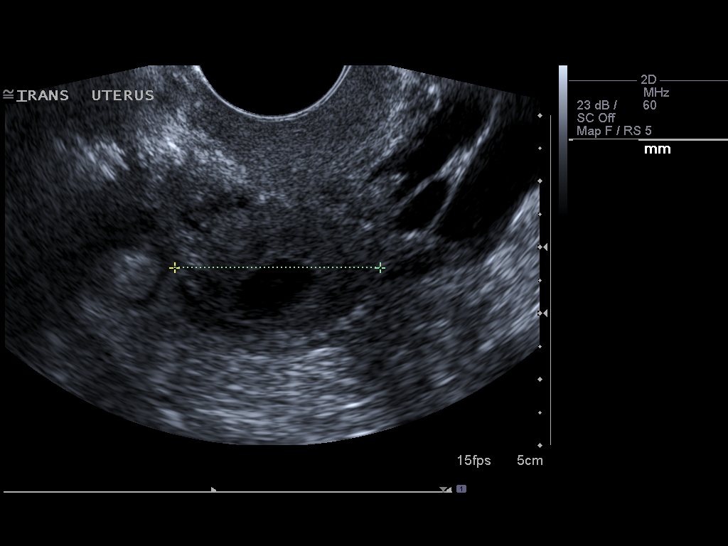
[im 33/50]
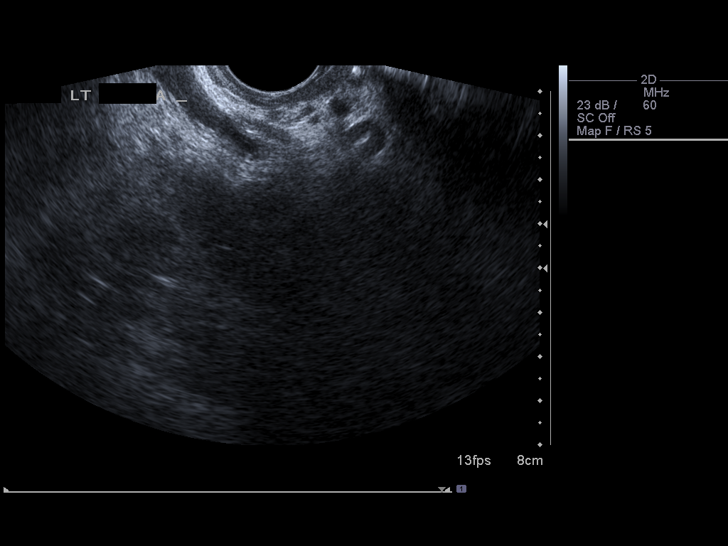
[im 37/50]
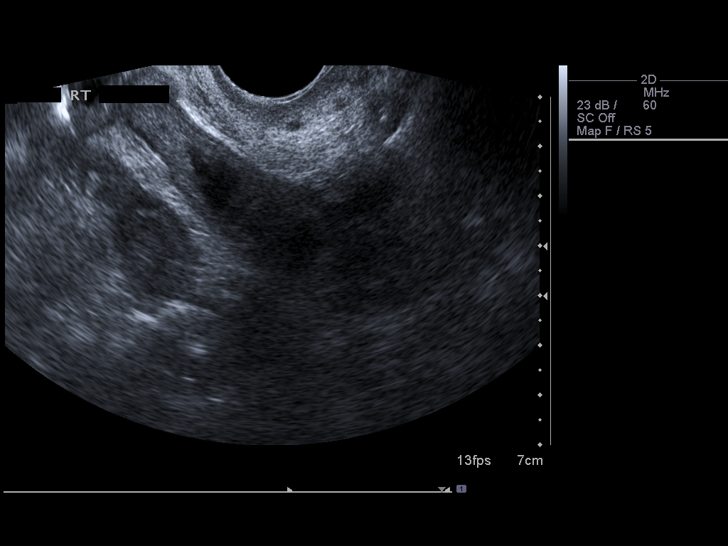
[im 41/50]
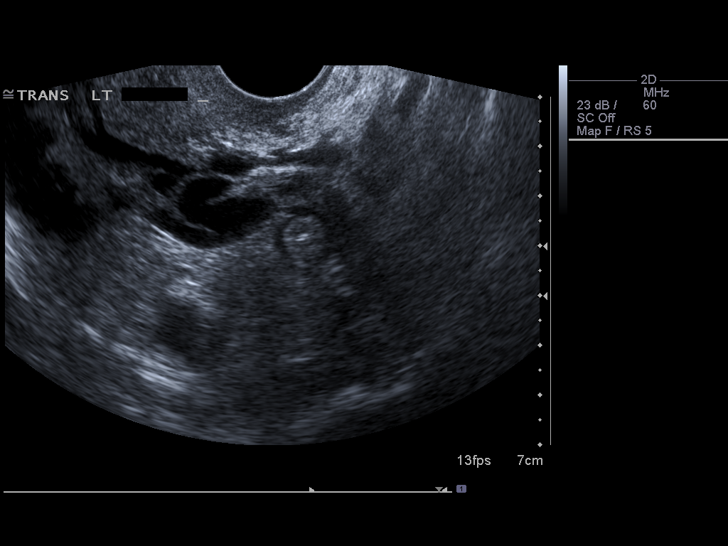
[im 45/50]
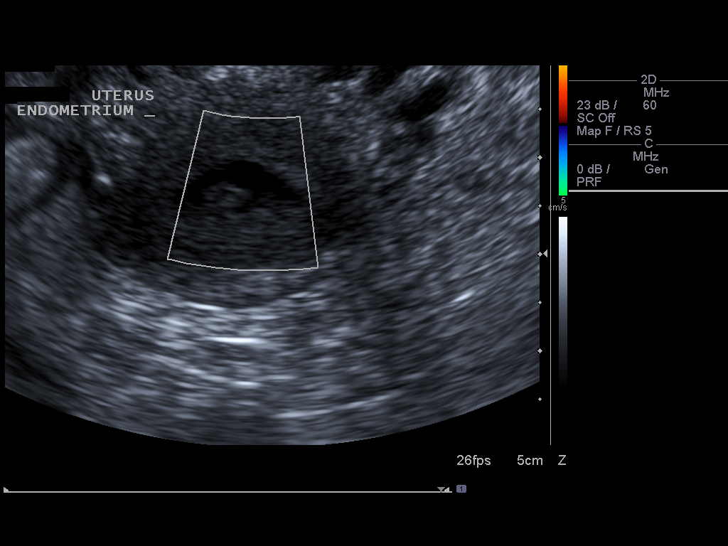
[im 50/50]
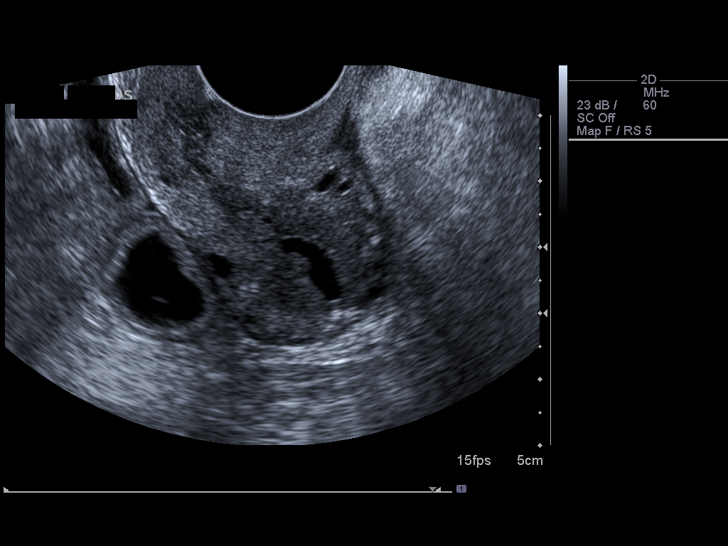

[13 of 25 positions shown; findings below may reference images not displayed]

FINDINGS: Uterus measures measures 6.1 x 2.3 x 3.4 cm. Uterus is retroverted.
No fibroids are identified.

Endometrium a small amount of fluid is seen within the endometrial
cavity.  A focal hyperechoic polyp is seen along the anterior wall
of the endometrial cavity which measures 4 x 2 x 6 mm.

Right Ovary measures 1.7 x 1.5 x 1.8 cm. Normal appearance.

Left Ovary is not directly visualized by transabdominal or
transvaginal sonography, however no adnexal mass identified.

Other Findings:  No adnexal mass or free fluid identified.
IMPRESSION: 1.  Fluid in endometrial cavity outlines a 6 mm polyp along the
anterior uterine wall.  Differential diagnosis in a post menopausal
female includes benign endometrial polyp and endometrial carcinoma.
Consideration of hysteroscopy is recommended.
2.  No evidence of adnexal mass.

## 2011-08-30 ENCOUNTER — Other Ambulatory Visit: Payer: Self-pay | Admitting: Family Medicine

## 2011-09-09 IMAGING — CT CT ABD-PELV W/ CM
2 of 5 series · 13 of 32 positions shown, 18 images · IV contrast (100 ML OMNI 300)
Comparison: Chest tube is abdomen earlier this same day and CT
abdomen pelvis 01/03/2009.

CLINICAL DATA: Abdominal pain, nausea and vomiting.  History of
colon cancer.

CT ABDOMEN AND PELVIS WITH CONTRAST
TECHNIQUE: Multidetector CT imaging of the abdomen and pelvis was
performed following the standard protocol during bolus
administration of intravenous contrast.
Contrast: 100 ml Imnipaque-LQQ.

[Series 2: routine abdomen · axial · 0.75mm/px · z∈[-407,-107]mm · 5 of 92 slices shown, 10 images]
[im 16/92  soft-tissue]
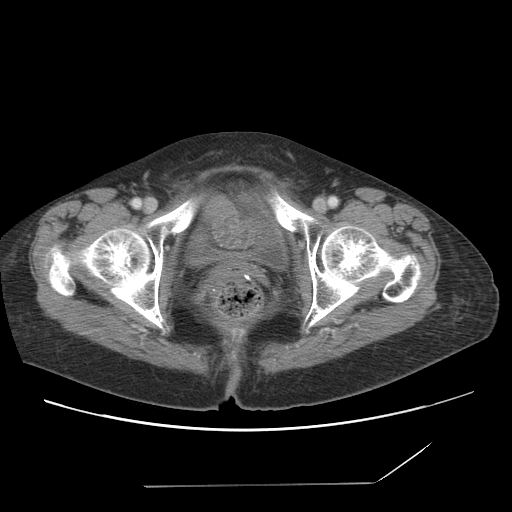
[im 16/92  bone]
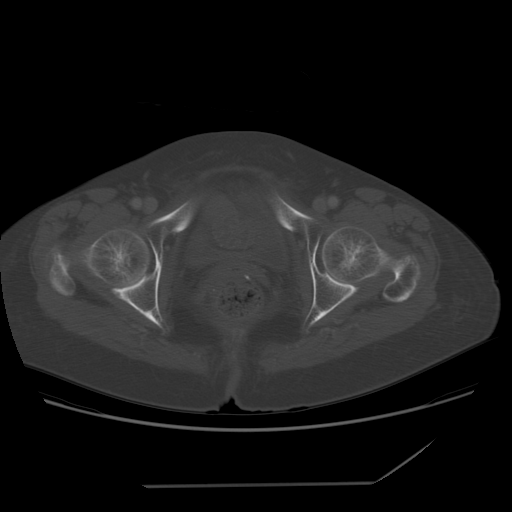
[im 31/92  soft-tissue]
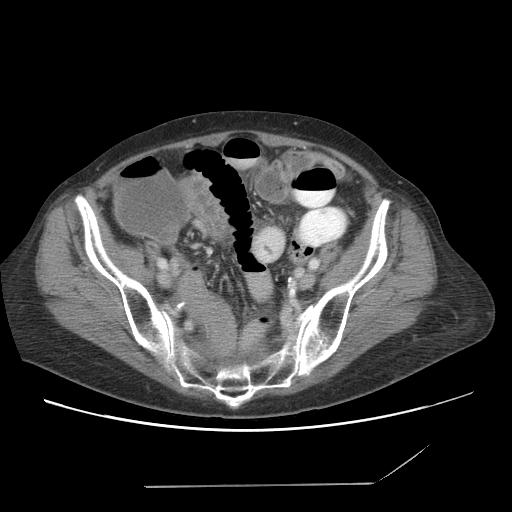
[im 31/92  lung]
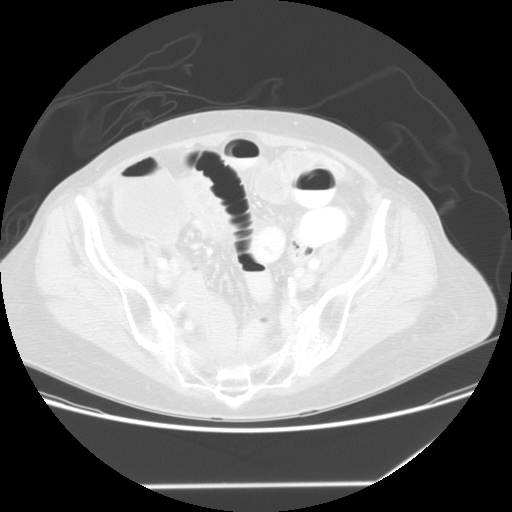
[im 46/92  soft-tissue]
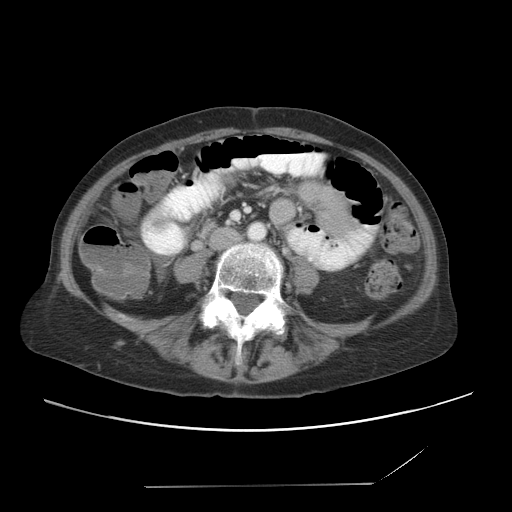
[im 46/92  lung]
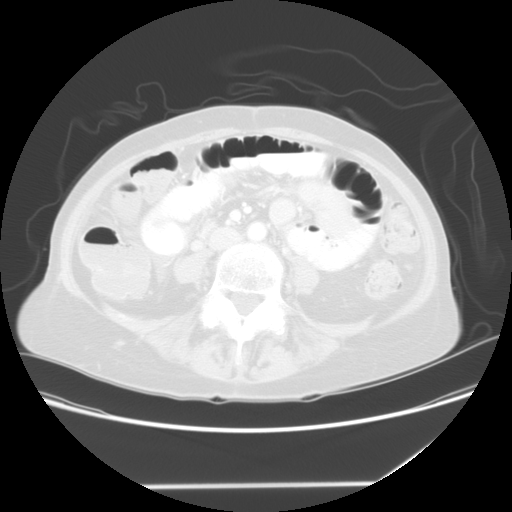
[im 61/92  soft-tissue]
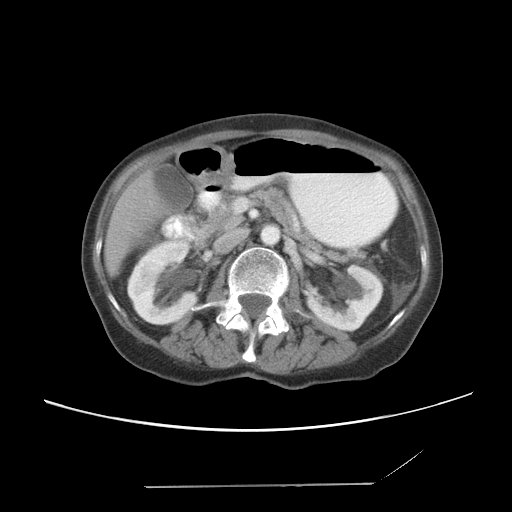
[im 61/92  lung]
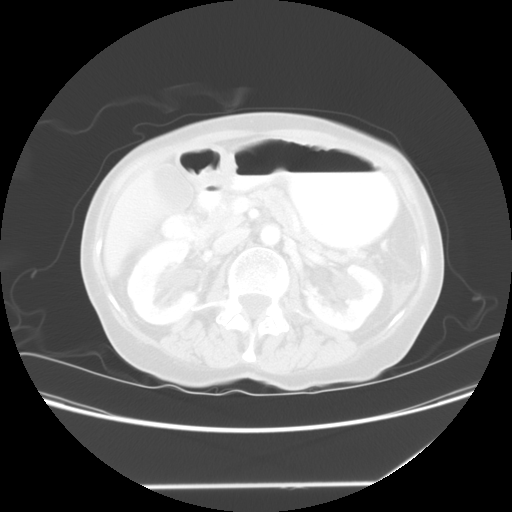
[im 76/92  soft-tissue]
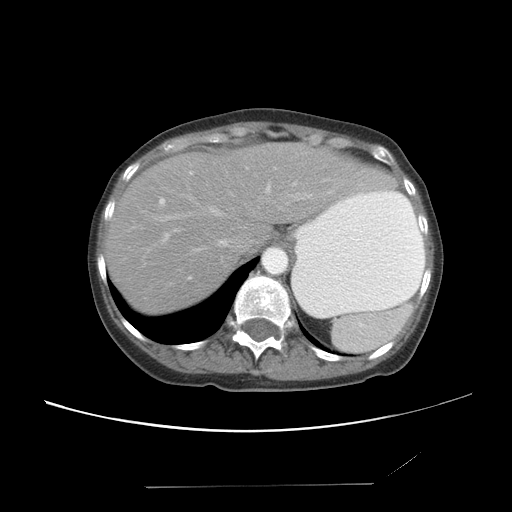
[im 76/92  lung]
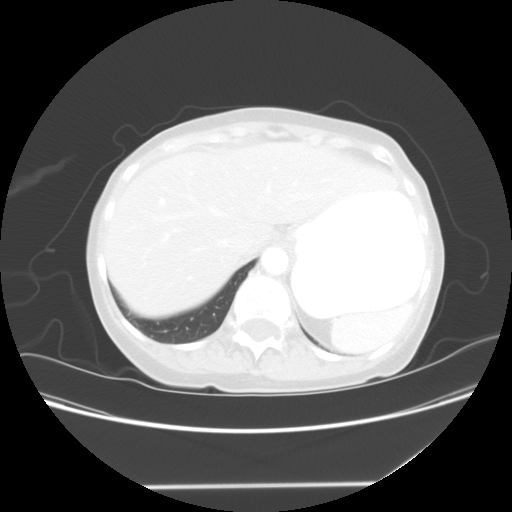

[Series 400: reformatted · sagittal · 0.92mm/px · 8 of 171 slices shown]
[im 16/171  soft-tissue]
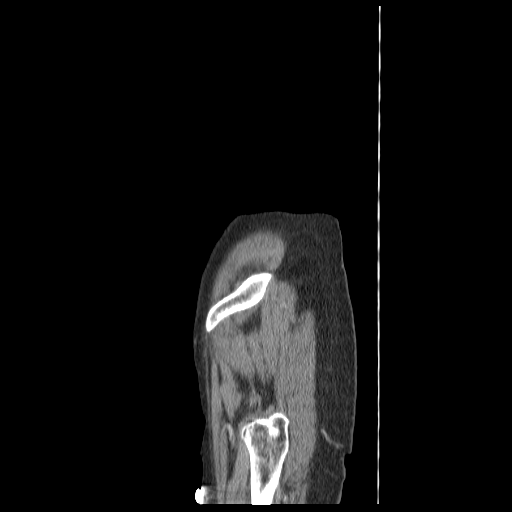
[im 31/171  soft-tissue]
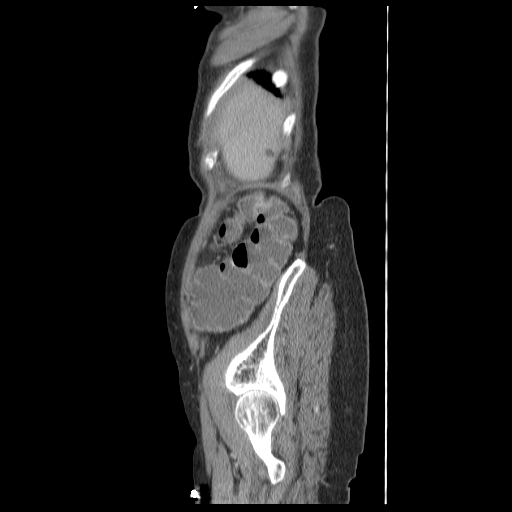
[im 62/171  soft-tissue]
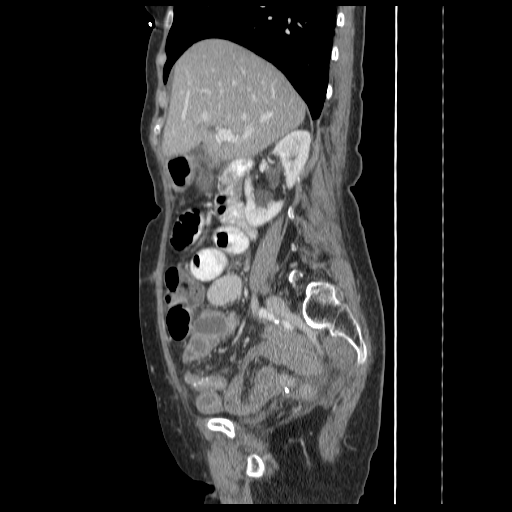
[im 78/171  soft-tissue]
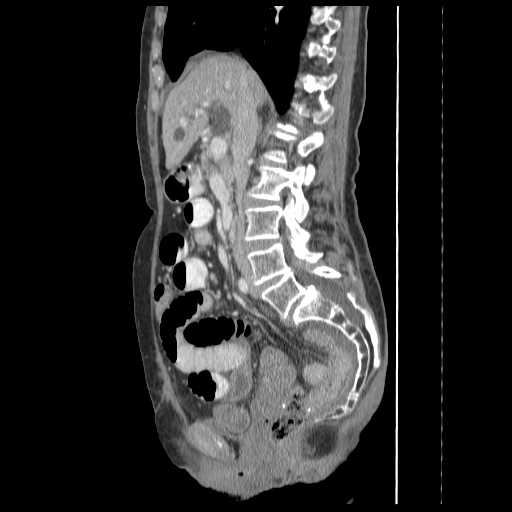
[im 93/171  soft-tissue]
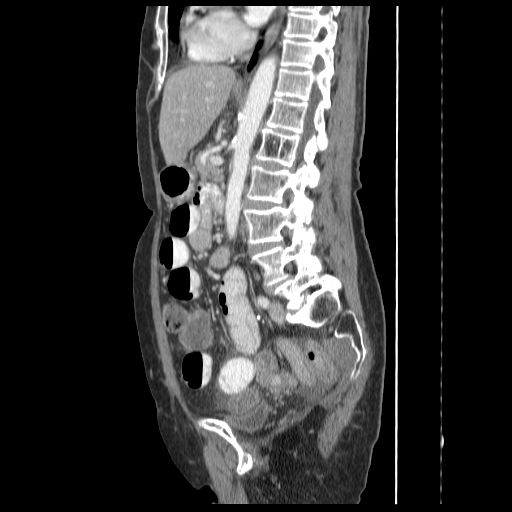
[im 109/171  soft-tissue]
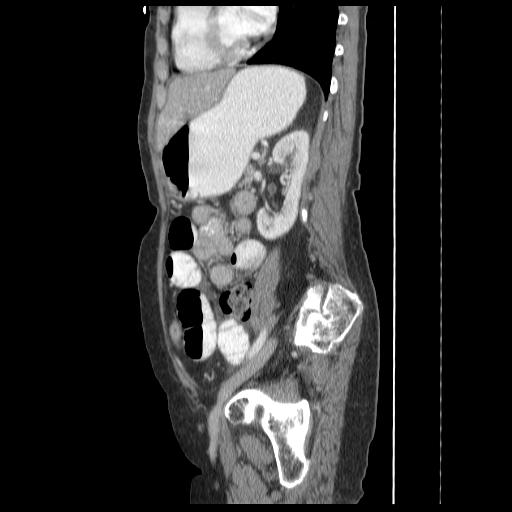
[im 140/171  soft-tissue]
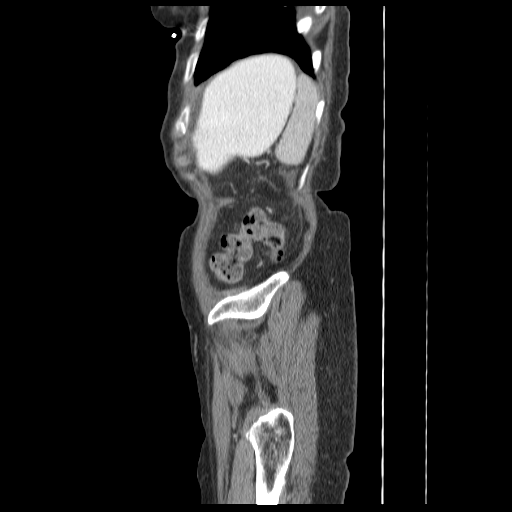
[im 155/171  soft-tissue]
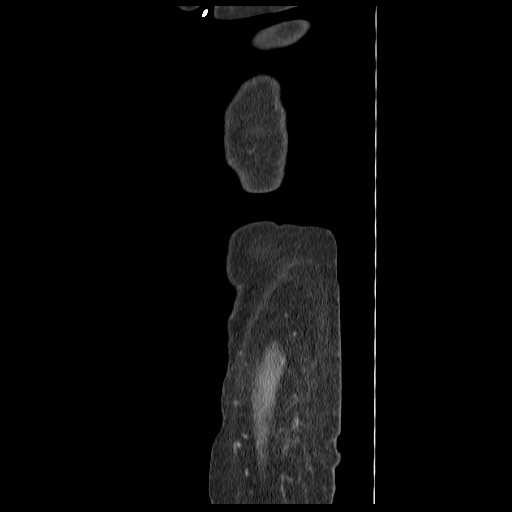

[13 of 32 positions shown; findings below may reference images not displayed]

FINDINGS: There is some dependent atelectatic change of lung base.
No pleural or pericardial effusion.

There is a small amount of perihepatic ascites.  The patient has
dilatation of small bowel to the distal ileum where a surgical
anastomosis is identified.  The terminal ileum is completely
decompressed.  There is a fairly large volume of liquid stool in
the ascending colon with gas and stool scattered in the remainder
of the colon.  Several of the patient's small bowel loops in the
pelvis demonstrate some wall thickening.  No pneumatosis, portal
venous gas or free intraperitoneal air.

Scattered, small hepatic cysts are unchanged.  The liver is
otherwise unremarkable.  The spleen, adrenal glands and pancreas
appear normal.  Bilateral parapelvic renal cysts and scarring in
the right kidney appear unchanged.  No abdominal lymphadenopathy.
No abscess.

There is soft tissue prominence anterior to the sacrum likely
reflecting post treatment change.  Surgical anastomosis is seen in
the rectum.

No lytic or sclerotic bone lesion is identified.  The patient has
lumbar spondylosis.  Unilateral pars defect on the right at L5 is
noted.  Mild anterolisthesis of L5 on S1 is seen.  There is a
hemangioma in L1.
IMPRESSION: 1.  Study is positive findings consistent with a small bowel
obstruction with the transition point at a surgical anastomoses in
the distal ileum.
2.  Thickening of the wall of small bowel loops in the pelvis may
be due to prior radiation therapy.  No convincing evidence of bowel
ischemia is identified.
3.  Hepatic cysts and parapelvic renal cysts.  Soft tissue
prominence anterior to the sacrum is most compatible with prior
therapy for carcinoma.

## 2011-09-13 ENCOUNTER — Other Ambulatory Visit: Payer: Self-pay | Admitting: Family Medicine

## 2011-09-25 ENCOUNTER — Other Ambulatory Visit: Payer: Self-pay | Admitting: Family Medicine

## 2011-10-14 ENCOUNTER — Other Ambulatory Visit: Payer: Self-pay | Admitting: Family Medicine

## 2011-12-08 ENCOUNTER — Other Ambulatory Visit: Payer: Self-pay | Admitting: Family Medicine

## 2011-12-16 ENCOUNTER — Ambulatory Visit (INDEPENDENT_AMBULATORY_CARE_PROVIDER_SITE_OTHER): Payer: Medicare Other | Admitting: Family Medicine

## 2011-12-16 ENCOUNTER — Encounter: Payer: Self-pay | Admitting: Family Medicine

## 2011-12-16 VITALS — BP 108/60 | HR 85 | Temp 98.0°F | Ht 65.0 in | Wt 108.0 lb

## 2011-12-16 DIAGNOSIS — R197 Diarrhea, unspecified: Secondary | ICD-10-CM

## 2011-12-16 DIAGNOSIS — K6389 Other specified diseases of intestine: Secondary | ICD-10-CM

## 2011-12-16 MED ORDER — RIFAXIMIN 550 MG PO TABS: 550.0000 mg | ORAL_TABLET | Freq: Three times a day (TID) | ORAL | Status: DC

## 2011-12-16 NOTE — Progress Notes (Signed)
  Patient Name: [name] Date of Birth: [birth year] Age: 78 y.o. Medical Record Number: [id number] Gender: female Date of Encounter: 12/16/2011  History of Present Illness:  [name] is a 78 y.o. very pleasant female patient who presents with the following:  Pleasant elderly lady, status post rectal cancer resection in March of 2010. She is coming in today for persistent diarrhea and weight loss of approximately 25 pounds since April 2012. She is actively followed by multiple physicians at [facility]. She is followed by gastroenterology at [facility]. Previously, some of her symptoms had been taking care of her helped by Questran. She is also used the probe body. She has been worked up at [facility], and was felt to have a bacterial overgrowth syndrome. She continues to have symptomatic and dramatic diarrhea approximately 10 times daily. She is hungry and eating well.  01/2011 - 130 lbs to now down to 108.  Has lost a great deal of weight and is eating all the time, but when she eats will eat all the time. Will have formed stools, but having BM's a lot of the time, including ---- without difference in food.  Had another friend who used. Xifaxan. From a friend.  Used for bacterial overgrowth   3/13 - appt with GI at [facility]. Dr. Gerre Pebbles.   Past Medical History, Surgical History, Social History, Family History, Problem List, Medications, and Allergies have been reviewed and updated if relevant.  Review of Systems: As above. Weight loss. Eating well. Diarrhea. No respiratory complaints. No chest pain.  Physical Examination: Filed Vitals:   12/16/11 1214  BP: 108/60  Pulse: 85  Temp: 98 F (36.7 C)  TempSrc: Oral  Height: 5\' 5"  (1.651 m)  Weight: 108 lb (48.988 kg)  SpO2: 98%    Body mass index is 17.97 kg/(m^2).   GEN: WDWN, NAD, Non-toxic, A & O x 3 HEENT: Atraumatic, Normocephalic. Neck supple. No masses, No LAD. Ears and Nose: No external deformity. CV: RRR, No  M/G/R. No JVD. No thrill. No extra heart sounds. PULM: CTA B, no wheezes, crackles, rhonchi. No retractions. No resp. distress. No accessory muscle use. EXTR: No c/c/e NEURO Normal gait.  PSYCH: Normally interactive. Conversant. Not depressed or anxious appearing.  Calm demeanor.    Assessment and Plan: 1. Bacterial overgrowth syndrome  rifaximin (XIFAXAN) 550 MG TABS  2. Diarrhea  Fecal lactoferrin, Stool culture, Clostridium Difficile by PCR, rifaximin (XIFAXAN) 550 MG TABS    Reviewed some literature on bacterial overgrowth syndrome. This antibiotic is first line recommendation per Up To Date, and I use to her dosing recommendations at a total of 1650 mg daily divided 3 times a day.  We will also check for C. difficile and other potential causes.

## 2011-12-17 ENCOUNTER — Encounter: Payer: Self-pay | Admitting: Family Medicine

## 2011-12-18 LAB — FECAL LACTOFERRIN, QUANT: Lactoferrin: NEGATIVE

## 2011-12-21 LAB — STOOL CULTURE

## 2011-12-23 ENCOUNTER — Telehealth: Payer: Self-pay | Admitting: *Deleted

## 2011-12-23 MED ORDER — METRONIDAZOLE 500 MG PO TABS: 500.0000 mg | ORAL_TABLET | Freq: Three times a day (TID) | ORAL | Status: AC

## 2011-12-23 NOTE — Telephone Encounter (Signed)
Patient called to get last office visit notes faxed to Dr. Johnsie Kindred with [facility] at [phone].  Office visit note from 12/16/2011 faxed to the number provided.

## 2011-12-23 NOTE — Progress Notes (Signed)
Addended by: Consuello Masse on: 12/23/2011 08:41 AM   Modules accepted: Orders

## 2011-12-24 ENCOUNTER — Telehealth: Payer: Self-pay | Admitting: Family Medicine

## 2011-12-24 NOTE — Telephone Encounter (Signed)
Can you address this since you saw her in past few days?

## 2011-12-24 NOTE — Telephone Encounter (Signed)
Triage Record Num: [id number] Operator: Geanie Berlin Patient Name: [name] Call Date & Time: 12/24/2011 11:49:08AM Patient Phone: [phone] PCP: Kerby Nora Patient Gender: Female PCP Fax : [phone] Patient DOB: 06/28/1933 Practice Name: [facility] Reason for Call: Caller: Junnie/Patient is calling with a question- Patient Does Not Want To Change To Metronidazole. The medication was written by Copland, Karleen Hampshire T. Diagnosed with C-Diff approx 12/17/11 per + stool culture. Started on Xifanax 12/17/11; but office staff called to advise to change to Metronidiazole. States was on Metronidiazole for 21 days during December 2012 and it did not help diarrhea. Has 2 days left of Xifanax; asking if could continue on it instead of changing to Metronidiazole. [facility] in [place] [phone]. Info noted and sent to MD for adult with questions about prescribed meds per Med Question Call Guideline. PLEASE CALL BACK. Protocol(s) Used: Medication Questions - Adult Recommended Outcome per Protocol: Speak with Provider or Pharmacist within 24 hours Reason for Outcome: Older adult with questions about prescribed and/or nonprescribed medications not covered by available resources Care Advice: ~ 12/24/2011 12:00:18PM Page 1 of 1 [email or web address]

## 2011-12-26 NOTE — Telephone Encounter (Signed)
Called, LMOM. Asked to call back in AM on Monday with correct, available phone numbers. I have tried to call her a number of times since the test came back positive and have not had the patient pick up or the number has blocked my personal phone dialed with *67.

## 2011-12-26 NOTE — Telephone Encounter (Signed)
The patient's listed mobile number does not belong to the patient.

## 2011-12-27 ENCOUNTER — Telehealth: Payer: Self-pay | Admitting: *Deleted

## 2011-12-27 ENCOUNTER — Telehealth: Payer: Self-pay | Admitting: Family Medicine

## 2011-12-27 MED ORDER — METRONIDAZOLE 500 MG PO TABS: 500.0000 mg | ORAL_TABLET | Freq: Three times a day (TID) | ORAL | Status: AC

## 2011-12-27 NOTE — Telephone Encounter (Signed)
Xanax 0.25 mg, 1 po tid prn anxiety. #30, 0 refills  Please call in for her

## 2011-12-27 NOTE — Telephone Encounter (Signed)
Now patient will take flagyl.  Sent in for her.

## 2011-12-27 NOTE — Telephone Encounter (Signed)
Patient's husband advised 

## 2011-12-27 NOTE — Telephone Encounter (Signed)
Rx called to pharmacy

## 2011-12-27 NOTE — Telephone Encounter (Signed)
I spoke with patient via telephone and she stated that she agrees to take oral Flagyl that was suggested by Dr. Patsy Lager.  [facility] in [place].

## 2011-12-27 NOTE — Telephone Encounter (Signed)
Sent in

## 2011-12-27 NOTE — Telephone Encounter (Signed)
I spoke to patient.  C. Diff is positive She would not like to take flagyl or oral vanc  Would like xifaxan -- i explained this has no efficacy for c. Diff per our inpatient ID pharmD that I spoke to last week, and I would not move forward with that.  She needs GI input and eval, and I asked her to call her GI at [facility] to be seen and to get their input if different from my own. She agreed and will contact them.

## 2011-12-27 NOTE — Telephone Encounter (Signed)
Triage Record Num: [id number] Operator: Valene Bors Patient Name: [name] Call Date & Time: 12/27/2011 11:15:15AM Patient Phone: [phone] PCP: Hannah Beat Patient Gender: Female PCP Fax : [phone] Patient DOB: 1932/11/05 Practice Name: [facility] Reason for Call: Caller: [name]/Patient; PCP: Juleen China.; Call regarding Returning Call To Dr. Dallas Schimke Re: Stool Cult Being +. for C. Diff; Was on 3 rounds of Cipro. Friend with C Diff was helped by 3 rounds of Xifanax and she would like to keep taking it. Dr. Dallas Schimke would like her take Metronidazole but didn't help when she took it in Dec. SHE IS INSISTANT ON TRYING TWO MORE ROUNDS OF Harrold Donath- SINCE HELPED FRIEND. SHE IS ALSO WANTING TO KNOW IF SHE CAN HAVE RX FOR XANAX- SHE IS HAVING SOME ANXIETY R/T FREQUENT DIARRHEA/ AND WT LOSS. SHE USES [facility] ON [address] IN [place] #[phone]. PLEASE GIVE MESSAGE TO DR. Dallas Schimke TO SEE IF Harrold Donath AND XANAX CAN BE ORDERED. CB#: [phone]; Protocol(s) Used: Office Note Recommended Outcome per Protocol: Information Noted and Sent to Office Reason for Outcome: Caller information to office Care Advice: ~ 12/27/2011 11:26:39AM Page 1 of 1 [email or web address]

## 2011-12-28 ENCOUNTER — Telehealth: Payer: Self-pay | Admitting: *Deleted

## 2011-12-28 MED ORDER — VANCOMYCIN HCL 250 MG PO CAPS: 250.0000 mg | ORAL_CAPSULE | Freq: Four times a day (QID) | ORAL | Status: AC

## 2011-12-28 NOTE — Telephone Encounter (Signed)
Patient called stating that the wrong medication was sent in yesterday for her C-Diff. Patient states that Flagyl was sent in and she has taken that before and she needs oral Vanocin called to pharmacy. Patient states that Dr. Elvina Sidle at [facility] gave her Flagyl back in December and she took it for 21 days and it did not help her. Patient states that she has talked with Ed (pharmacist) and he is expecting a new script. Patient states that Dr. Yetta Barre has turned her over to Dr. Gerre Pebbles and she is scheduled to have a colonoscopy in May.  Pharmacy [facility]

## 2011-12-28 NOTE — Telephone Encounter (Signed)
I am fully aware of this case and have asked her to call GI at [facility] and f/u with them, which is most appropriate. C. Diff +, ok to treat with flagyl or oral vancocin    If she specifically wanted Vanc, that message was not given to me, and I apologize for the confusion. Oral vanc sent in  I still want her to call [facility] and try to move up her appt.

## 2011-12-28 NOTE — Telephone Encounter (Signed)
Patient says that she can not get in any sooner with GI at [facility] then MAY.

## 2012-01-06 ENCOUNTER — Telehealth: Payer: Self-pay

## 2012-01-06 NOTE — Telephone Encounter (Signed)
Noted, she should have been adequately treated by initial course.

## 2012-01-06 NOTE — Telephone Encounter (Signed)
Pt said received letter from insurance co that if she needs more Vancomycin will need letter from her Dr. Rock Nephew will bring letter for Dr Copland's nurse to review today. Pt said she is feeling better and has 1 more week of Vancomycin left.

## 2012-01-10 ENCOUNTER — Ambulatory Visit: Payer: Medicare Other | Admitting: Family Medicine

## 2012-01-10 ENCOUNTER — Telehealth: Payer: Self-pay | Admitting: Family Medicine

## 2012-01-10 DIAGNOSIS — C20 Malignant neoplasm of rectum: Secondary | ICD-10-CM

## 2012-01-10 DIAGNOSIS — R197 Diarrhea, unspecified: Secondary | ICD-10-CM

## 2012-01-10 NOTE — Telephone Encounter (Signed)
Patient said she will be picking up the last of the prescription VANCOMYCIN today and want to know if you want to do another stool culture to see if she is making progress, or do you want to just renew the present prescription.

## 2012-01-10 NOTE — Telephone Encounter (Signed)
Patient advised and she still has 4 days of the medication left. Patient says that she is not feeling any better.

## 2012-01-10 NOTE — Telephone Encounter (Signed)
Have our office call [facility] to attempt to get her in sooner

## 2012-01-10 NOTE — Telephone Encounter (Signed)
She has been fully treated. F/u C. Diff testing not routinely done.  No further antibiotics are needed. Recommend keeping appt with GI at [facility] as we discussed.

## 2012-01-12 ENCOUNTER — Telehealth: Payer: Self-pay | Admitting: Family Medicine

## 2012-01-12 NOTE — Telephone Encounter (Signed)
Dr Liston Alba nurse called to say that they are trying to re-schedule the patient right now for a Colonoscopy which the patient cancelled for last week on 01/05/2012. They have been in touch with her already and hope to fit her in for  that procedure soon. I also called Ms Taira and asked her to please call me when thet call her with the appt information so we know that she has been taken care of and she said she would.

## 2012-01-12 NOTE — Telephone Encounter (Signed)
All noted.  

## 2012-02-14 ENCOUNTER — Encounter: Payer: Self-pay | Admitting: Internal Medicine

## 2012-03-03 ENCOUNTER — Telehealth: Payer: Self-pay | Admitting: Family Medicine

## 2012-03-03 ENCOUNTER — Telehealth: Payer: Self-pay

## 2012-03-03 ENCOUNTER — Other Ambulatory Visit: Payer: Self-pay | Admitting: Family Medicine

## 2012-03-03 DIAGNOSIS — R197 Diarrhea, unspecified: Secondary | ICD-10-CM

## 2012-03-03 NOTE — Telephone Encounter (Signed)
Message from [facility] just came through fax about pt. I put it in your inbox on your desk.

## 2012-03-03 NOTE — Telephone Encounter (Signed)
Pt said needing test done prior to seeing Johnsie Kindred at [facility] on 03/07/12. Pt not sure test name and pt will have Carla t Melissa Garrett's office fax request to Dr Ermalene Searing.

## 2012-03-03 NOTE — Telephone Encounter (Signed)
Addressed and put in outbox. Ok to order labs

## 2012-03-03 NOTE — Telephone Encounter (Signed)
Patient advised stool kit ready for pick up and keep refrigerated sent to dr Ermalene Searing to order labs

## 2012-03-03 NOTE — Telephone Encounter (Signed)
Addended by: Kerby Nora E on: 03/03/2012 04:06 PM   Modules accepted: Orders

## 2012-03-03 NOTE — Telephone Encounter (Signed)
Cannot find electrolytes, qualitative feces in EMR. Please help.

## 2012-03-06 ENCOUNTER — Other Ambulatory Visit: Payer: Self-pay | Admitting: Family Medicine

## 2012-03-27 ENCOUNTER — Other Ambulatory Visit: Payer: Self-pay | Admitting: Family Medicine

## 2012-04-05 ENCOUNTER — Encounter: Payer: Self-pay | Admitting: Internal Medicine

## 2012-04-05 NOTE — Progress Notes (Signed)
Patient scheduled an appointment with Dr Juanda Chance for diarrhea. She stated that it had been over 1 year since she last saw [facility]. However, I have spoken to [facility] and she last saw a GI doctor, Dr Gerre Pebbles on 03/07/12. I have asked Glenetta Borg, [facility] to contact patient and ask for all [facility] records before we see her in the office.

## 2012-04-25 ENCOUNTER — Ambulatory Visit: Payer: Medicare Other | Admitting: Internal Medicine

## 2012-04-26 ENCOUNTER — Ambulatory Visit (INDEPENDENT_AMBULATORY_CARE_PROVIDER_SITE_OTHER): Payer: Medicare Other | Admitting: Family Medicine

## 2012-04-26 ENCOUNTER — Encounter: Payer: Self-pay | Admitting: Family Medicine

## 2012-04-26 VITALS — BP 120/78 | HR 81 | Temp 97.6°F | Ht 65.0 in | Wt 106.5 lb

## 2012-04-26 DIAGNOSIS — F438 Other reactions to severe stress: Secondary | ICD-10-CM

## 2012-04-26 DIAGNOSIS — R197 Diarrhea, unspecified: Secondary | ICD-10-CM

## 2012-04-26 DIAGNOSIS — C20 Malignant neoplasm of rectum: Secondary | ICD-10-CM

## 2012-04-26 DIAGNOSIS — K529 Noninfective gastroenteritis and colitis, unspecified: Secondary | ICD-10-CM

## 2012-04-26 MED ORDER — ALPRAZOLAM 0.5 MG PO TABS: 0.2500 mg | ORAL_TABLET | Freq: Two times a day (BID) | ORAL | Status: DC | PRN

## 2012-04-26 MED ORDER — DIPHENOXYLATE-ATROPINE 2.5-0.025 MG PO TABS: 1.0000 | ORAL_TABLET | Freq: Four times a day (QID) | ORAL | Status: AC | PRN

## 2012-04-26 NOTE — Assessment & Plan Note (Signed)
Treat with lomotil on limited basis per GI. Push fluids.

## 2012-04-26 NOTE — Patient Instructions (Addendum)
Limit lomotil as possible.  Push fluids. Continue dietary supplement.

## 2012-04-26 NOTE — Assessment & Plan Note (Signed)
Stable , rarely using alprazolam, but needs refill.

## 2012-04-26 NOTE — Progress Notes (Signed)
  Subjective:    Patient ID: [name], female    DOB: Feb 10, 1933, 78 y.o.   MRN: [id number]  HPI   78 year old female with hisotry of rectal CA.Marland Kitchen Last surgery on rectum 15 months ago. She reports continued diarrhea despite questran 4 time a day..  Dr. Abundio Miu, radiaologist did three years scan which was great. Started on lomotil by Dr. Gerre Pebbles GI at [facility]. Gave her 60 tabs... Lomotil helped her go to one BM daily. When she called for refills... She was refused initially , then half a prescription was given. She has had decreased weight to 106.   She plans to no longer return to [facility], as not covered by insurance any more. She plans to [facility] to [facility] in [place].   Last BMET normal in last few months. Last OV from GI reviewed 03/2012.  Drinks pedialyte.  Anxiety, stable: using alprazolam for sleep occ and anxiety.     Review of Systems  Constitutional: Negative for fever and fatigue.  HENT: Negative for ear pain.   Eyes: Negative for pain.  Respiratory: Negative for chest tightness and shortness of breath.   Cardiovascular: Negative for chest pain, palpitations and leg swelling.  Gastrointestinal: Negative for abdominal pain.  Genitourinary: Negative for dysuria.       Objective:   Physical Exam  Constitutional: Vital signs are normal. She appears well-developed and well-nourished. She is cooperative.  Non-toxic appearance. She does not appear ill. No distress.  HENT:  Head: Normocephalic.  Right Ear: Hearing, tympanic membrane and ear canal normal. Tympanic membrane is not erythematous, not retracted and not bulging.  Left Ear: Hearing, tympanic membrane and ear canal normal. Tympanic membrane is not erythematous, not retracted and not bulging.  Nose: No mucosal edema or rhinorrhea. Right sinus exhibits no maxillary sinus tenderness and no frontal sinus tenderness. Left sinus exhibits no maxillary sinus tenderness and no frontal sinus tenderness.  Mouth/Throat:  Uvula is midline and mucous membranes are normal.  Eyes: Lids are normal. No foreign bodies found.  Neck: Trachea normal and normal range of motion. Neck supple. Carotid bruit is not present. No mass and no thyromegaly present.  Cardiovascular: Normal rate, regular rhythm, S1 normal, S2 normal, normal heart sounds, intact distal pulses and normal pulses.  Exam reveals no gallop and no friction rub.   No murmur heard. Pulmonary/Chest: Effort normal and breath sounds normal. Not tachypneic. No respiratory distress. She has no decreased breath sounds. She has no wheezes. She has no rhonchi. She has no rales.  Abdominal: Soft. Normal appearance and bowel sounds are normal. There is no tenderness.  Neurological: She is alert.  Skin: Skin is warm, dry and intact. No rash noted.  Psychiatric: Her speech is normal and behavior is normal. Judgment and thought content normal. Her mood appears not anxious. Cognition and memory are normal. She does not exhibit a depressed mood.          Assessment & Plan:

## 2012-05-23 ENCOUNTER — Telehealth: Payer: Self-pay | Admitting: *Deleted

## 2012-05-23 MED ORDER — DIPHENOXYLATE-ATROPINE 2.5-0.025 MG PO TABS: 1.0000 | ORAL_TABLET | Freq: Four times a day (QID) | ORAL | Status: DC | PRN

## 2012-05-23 NOTE — Telephone Encounter (Signed)
rx sent to pharmacy

## 2012-05-23 NOTE — Telephone Encounter (Signed)
Okay to refill? 

## 2012-05-23 NOTE — Telephone Encounter (Signed)
Received faxed refill request from pharmacy for Diphen/Atrop 2.5 mg tab myla. Medication is no longer on med sheet. Last office visit was 04/26/12. Is it okay to refill medication?

## 2012-05-25 ENCOUNTER — Other Ambulatory Visit: Payer: Self-pay | Admitting: *Deleted

## 2012-05-25 MED ORDER — DIPHENOXYLATE-ATROPINE 2.5-0.025 MG PO TABS: 1.0000 | ORAL_TABLET | Freq: Four times a day (QID) | ORAL | Status: AC | PRN

## 2012-05-25 NOTE — Telephone Encounter (Signed)
Faxed refill request from [facility].

## 2012-05-26 ENCOUNTER — Other Ambulatory Visit: Payer: Self-pay

## 2012-05-26 NOTE — Telephone Encounter (Signed)
Medicine called to pharmacy. 

## 2012-05-26 NOTE — Telephone Encounter (Signed)
Pt left v/m requesting refill Lomotil.Patient notified as instructed by telephone that lomotil was sent to [facility].

## 2012-06-27 ENCOUNTER — Other Ambulatory Visit: Payer: Self-pay | Admitting: *Deleted

## 2012-06-27 NOTE — Telephone Encounter (Signed)
Faxed refill request from [facility], last filled 05/26/12.

## 2012-06-29 ENCOUNTER — Other Ambulatory Visit: Payer: Self-pay | Admitting: Family Medicine

## 2012-06-29 MED ORDER — DIPHENOXYLATE-ATROPINE 2.5-0.025 MG PO TABS: 1.0000 | ORAL_TABLET | Freq: Four times a day (QID) | ORAL | Status: DC | PRN

## 2012-06-29 NOTE — Telephone Encounter (Signed)
[facility] called in to [facility] 

## 2012-06-29 NOTE — Telephone Encounter (Signed)
Pt is needing her Lomotil refilled for her diarrhea. She is saying that she really needs it. She uses [facility] on [address]. In [place].

## 2012-06-29 NOTE — Telephone Encounter (Signed)
Med called this AM to [facility]; Thayer Ohm [facility] said med ready for pickup at 9:30 am.Patient notified as instructed by telephone.

## 2012-07-27 ENCOUNTER — Telehealth: Payer: Self-pay | Admitting: Family Medicine

## 2012-07-27 NOTE — Telephone Encounter (Signed)
Refill- diphen/atrop 2.5mg  tab. Last fill 9.5.13

## 2012-07-28 ENCOUNTER — Other Ambulatory Visit: Payer: Self-pay | Admitting: *Deleted

## 2012-07-28 MED ORDER — DIPHENOXYLATE-ATROPINE 2.5-0.025 MG PO TABS: 1.0000 | ORAL_TABLET | Freq: Four times a day (QID) | ORAL | Status: DC | PRN

## 2012-07-28 NOTE — Telephone Encounter (Signed)
[facility] 

## 2012-07-28 NOTE — Telephone Encounter (Signed)
Faxed refill request from [facility], last filled 06/29/12.

## 2012-07-31 NOTE — Telephone Encounter (Signed)
Refilled already on 10/4... Has it been called in yet?

## 2012-07-31 NOTE — Telephone Encounter (Signed)
Medicine called to [facility] on Friday 10/04.  Verified with pharmacist, he said it has been filled.

## 2012-08-29 ENCOUNTER — Other Ambulatory Visit: Payer: Self-pay | Admitting: *Deleted

## 2012-08-29 MED ORDER — DIPHENOXYLATE-ATROPINE 2.5-0.025 MG PO TABS: 1.0000 | ORAL_TABLET | Freq: Four times a day (QID) | ORAL | Status: DC | PRN

## 2012-08-29 NOTE — Telephone Encounter (Signed)
[facility]ed [facility]. 

## 2012-08-30 ENCOUNTER — Other Ambulatory Visit: Payer: Self-pay | Admitting: *Deleted

## 2012-08-30 NOTE — Telephone Encounter (Signed)
Okay to refill? 

## 2012-08-31 NOTE — Telephone Encounter (Signed)
Denied.. Just sent in on 11/5

## 2012-09-04 ENCOUNTER — Other Ambulatory Visit: Payer: Self-pay | Admitting: Family Medicine

## 2012-09-27 ENCOUNTER — Other Ambulatory Visit: Payer: Self-pay | Admitting: *Deleted

## 2012-09-28 MED ORDER — DIPHENOXYLATE-ATROPINE 2.5-0.025 MG PO TABS: 1.0000 | ORAL_TABLET | Freq: Four times a day (QID) | ORAL | Status: DC | PRN

## 2012-09-28 NOTE — Telephone Encounter (Signed)
Ok to refill #120, 0 refills 

## 2012-09-29 NOTE — Telephone Encounter (Signed)
Pt left v/m requesting refill Lomotil.Medication phoned to [facility] as instructed.Patient notified as instructed by telephone refill done.

## 2012-10-31 ENCOUNTER — Other Ambulatory Visit: Payer: Self-pay | Admitting: *Deleted

## 2012-10-31 MED ORDER — DIPHENOXYLATE-ATROPINE 2.5-0.025 MG PO TABS: 1.0000 | ORAL_TABLET | Freq: Four times a day (QID) | ORAL | Status: DC | PRN

## 2012-11-17 ENCOUNTER — Ambulatory Visit (INDEPENDENT_AMBULATORY_CARE_PROVIDER_SITE_OTHER): Payer: Medicare Other | Admitting: Family Medicine

## 2012-11-17 ENCOUNTER — Encounter: Payer: Self-pay | Admitting: Family Medicine

## 2012-11-17 VITALS — BP 120/72 | HR 81 | Temp 98.1°F | Ht 65.0 in | Wt 104.8 lb

## 2012-11-17 DIAGNOSIS — K529 Noninfective gastroenteritis and colitis, unspecified: Secondary | ICD-10-CM

## 2012-11-17 DIAGNOSIS — C20 Malignant neoplasm of rectum: Secondary | ICD-10-CM

## 2012-11-17 DIAGNOSIS — R197 Diarrhea, unspecified: Secondary | ICD-10-CM

## 2012-11-17 NOTE — Progress Notes (Signed)
  Subjective:    Patient ID: [name], female    DOB: 10-29-1932, 79 y.o.   MRN: [id number]  HPI 79 year old female with  Stage 3 rectal cancer status post several rectal surgeries  Second surgery was for a polyp seen.  Had pill endoscopy but camera got stuck in small intestine.. Had open surgery to get camera out. Last surgery on rectum was  2 years ago.  Has severe chronic diarrhea ( 6-10 BMs a day  secondary to procedures and radiation. She has been unable to gain weight.  Has been on chronic lomotil, taking 3-6 a day, takes 6 immodiums as well as questran to try to keep up with stool issues. This issue is significantly quality of life altering.   She has seen several GI MDs.Marland Kitchenthought diarrhea stems from surgeries.  Oncologist: Dr. Abundio Miu at [facility], colorectal Dr. Donnetta Hutching     Insurance no longer pays .Marland Kitchen So she would like to transfer to local oncologist.    Review of Systems  Constitutional: Negative for fever and fatigue.  HENT: Negative for ear pain.   Eyes: Negative for pain.  Respiratory: Negative for chest tightness and shortness of breath.   Cardiovascular: Negative for chest pain, palpitations and leg swelling.  Gastrointestinal: Negative for abdominal pain.  Genitourinary: Negative for dysuria.       Objective:   Physical Exam  Constitutional: Vital signs are normal. She appears well-developed and well-nourished. She is cooperative.  Non-toxic appearance. She does not appear ill. No distress.  HENT:  Head: Normocephalic.  Right Ear: Hearing, tympanic membrane, external ear and ear canal normal. Tympanic membrane is not erythematous, not retracted and not bulging.  Left Ear: Hearing, tympanic membrane, external ear and ear canal normal. Tympanic membrane is not erythematous, not retracted and not bulging.  Nose: No mucosal edema or rhinorrhea. Right sinus exhibits no maxillary sinus tenderness and no frontal sinus tenderness. Left sinus exhibits no  maxillary sinus tenderness and no frontal sinus tenderness.  Mouth/Throat: Uvula is midline, oropharynx is clear and moist and mucous membranes are normal.  Eyes: Conjunctivae normal, EOM and lids are normal. Pupils are equal, round, and reactive to light. No foreign bodies found.  Neck: Trachea normal and normal range of motion. Neck supple. Carotid bruit is not present. No mass and no thyromegaly present.  Cardiovascular: Normal rate, regular rhythm, S1 normal, S2 normal, normal heart sounds, intact distal pulses and normal pulses.  Exam reveals no gallop and no friction rub.   No murmur heard. Pulmonary/Chest: Effort normal and breath sounds normal. Not tachypneic. No respiratory distress. She has no decreased breath sounds. She has no wheezes. She has no rhonchi. She has no rales.  Abdominal: Soft. Normal appearance and bowel sounds are normal. There is no tenderness.  Neurological: She is alert.  Skin: Skin is warm, dry and intact. No rash noted.  Psychiatric: Her speech is normal and behavior is normal. Judgment and thought content normal. Her mood appears not anxious. Cognition and memory are normal. She does not exhibit a depressed mood.          Assessment & Plan:

## 2012-11-17 NOTE — Assessment & Plan Note (Signed)
Agree with referral to local onc for chronic follow up.

## 2012-11-17 NOTE — Patient Instructions (Signed)
Stop at front desk to set up up referral.  I agree with you setting up appt with Dr. Juanda Chance on your own. Make appt for medicare follow up with labs prior

## 2012-11-17 NOTE — Assessment & Plan Note (Signed)
On multiple meds.. Agree with discussion with Dr. Juanda Chance for any further recommendations.

## 2012-11-18 ENCOUNTER — Telehealth: Payer: Self-pay | Admitting: Family Medicine

## 2012-11-18 DIAGNOSIS — M810 Age-related osteoporosis without current pathological fracture: Secondary | ICD-10-CM

## 2012-11-18 DIAGNOSIS — I10 Essential (primary) hypertension: Secondary | ICD-10-CM

## 2012-11-18 DIAGNOSIS — E785 Hyperlipidemia, unspecified: Secondary | ICD-10-CM

## 2012-11-18 NOTE — Telephone Encounter (Signed)
Message copied by Excell Seltzer on Sat Nov 18, 2012 12:10 PM ------      Message from: Alvina Chou      Created: Fri Nov 17, 2012  4:37 PM      Regarding: LAB ORDERS FOR MONDAY, 1.27.14       Patient is scheduled for CPX labs, please order future labs, Thanks , Camelia Eng

## 2012-11-20 ENCOUNTER — Other Ambulatory Visit (INDEPENDENT_AMBULATORY_CARE_PROVIDER_SITE_OTHER): Payer: Medicare Other

## 2012-11-20 DIAGNOSIS — I10 Essential (primary) hypertension: Secondary | ICD-10-CM

## 2012-11-20 DIAGNOSIS — E785 Hyperlipidemia, unspecified: Secondary | ICD-10-CM

## 2012-11-20 DIAGNOSIS — M810 Age-related osteoporosis without current pathological fracture: Secondary | ICD-10-CM

## 2012-11-20 LAB — COMPREHENSIVE METABOLIC PANEL
ALT: 20 U/L (ref 0–35)
AST: 16 U/L (ref 0–37)
Albumin: 4.3 g/dL (ref 3.5–5.2)
Alkaline Phosphatase: 85 U/L (ref 39–117)
Glucose, Bld: 92 mg/dL (ref 70–99)
Potassium: 4.2 mEq/L (ref 3.5–5.1)
Sodium: 142 mEq/L (ref 135–145)
Total Bilirubin: 1.1 mg/dL (ref 0.3–1.2)
Total Protein: 7.2 g/dL (ref 6.0–8.3)

## 2012-11-20 LAB — LIPID PANEL
Cholesterol: 174 mg/dL (ref 0–200)
LDL Cholesterol: 54 mg/dL (ref 0–99)
Total CHOL/HDL Ratio: 2
VLDL: 14.2 mg/dL (ref 0.0–40.0)

## 2012-11-21 LAB — VITAMIN D 25 HYDROXY (VIT D DEFICIENCY, FRACTURES): Vit D, 25-Hydroxy: 11 ng/mL — ABNORMAL LOW (ref 30–89)

## 2012-11-24 ENCOUNTER — Encounter: Payer: Medicare Other | Admitting: Family Medicine

## 2012-11-28 ENCOUNTER — Other Ambulatory Visit: Payer: Self-pay | Admitting: *Deleted

## 2012-11-28 ENCOUNTER — Other Ambulatory Visit: Payer: Self-pay

## 2012-11-28 MED ORDER — CHOLESTYRAMINE 4 G PO PACK: 1.0000 | PACK | Freq: Three times a day (TID) | ORAL | Status: DC

## 2012-11-28 NOTE — Telephone Encounter (Signed)
Pt request refill Lomotil for chronic diarrhea to [facility].Please advise.

## 2012-11-29 MED ORDER — DIPHENOXYLATE-ATROPINE 2.5-0.025 MG PO TABS: 1.0000 | ORAL_TABLET | Freq: Four times a day (QID) | ORAL | Status: DC | PRN

## 2012-11-29 NOTE — Telephone Encounter (Signed)
rx called to pharmacy 

## 2012-12-07 ENCOUNTER — Encounter: Payer: Medicare Other | Admitting: Family Medicine

## 2012-12-11 ENCOUNTER — Ambulatory Visit: Payer: Self-pay | Admitting: Hematology and Oncology

## 2012-12-12 ENCOUNTER — Encounter: Payer: Self-pay | Admitting: Family Medicine

## 2012-12-12 ENCOUNTER — Ambulatory Visit (INDEPENDENT_AMBULATORY_CARE_PROVIDER_SITE_OTHER): Payer: Medicare Other | Admitting: Family Medicine

## 2012-12-12 VITALS — BP 120/70 | HR 86 | Temp 97.9°F | Ht 65.0 in | Wt 106.0 lb

## 2012-12-12 DIAGNOSIS — I10 Essential (primary) hypertension: Secondary | ICD-10-CM

## 2012-12-12 DIAGNOSIS — M810 Age-related osteoporosis without current pathological fracture: Secondary | ICD-10-CM

## 2012-12-12 DIAGNOSIS — E785 Hyperlipidemia, unspecified: Secondary | ICD-10-CM

## 2012-12-12 DIAGNOSIS — C20 Malignant neoplasm of rectum: Secondary | ICD-10-CM

## 2012-12-12 DIAGNOSIS — E559 Vitamin D deficiency, unspecified: Secondary | ICD-10-CM

## 2012-12-12 DIAGNOSIS — Z0000 Encounter for general adult medical examination without abnormal findings: Secondary | ICD-10-CM

## 2012-12-12 MED ORDER — VITAMIN D3 1.25 MG (50000 UT) PO CAPS: 50000.0000 | ORAL_CAPSULE | ORAL | Status: DC

## 2012-12-12 NOTE — Assessment & Plan Note (Signed)
Replete

## 2012-12-12 NOTE — Patient Instructions (Addendum)
Okay to stay off HCTZ. Follow BP at home, restart if BP > 140/90 Consider shingles vaccine. Start prescription vit D x 12 weeks, when completed take 400 IU twice daily of OTC vit D3. Work on calcium in diet and weight bearing exercise. Set up eye exam for maintenance.

## 2012-12-12 NOTE — Assessment & Plan Note (Signed)
Refuses DEXA. Will treat with ca , vit D and weight bearing exercise.

## 2012-12-12 NOTE — Assessment & Plan Note (Signed)
Well controlled off med. Okay to stay off HCTZ.

## 2012-12-12 NOTE — Progress Notes (Signed)
Subjective:    Patient ID: [name], female    DOB: 1933-03-23, 79 y.o.   MRN: [id number]  HPI  I have personally reviewed the Medicare Annual Wellness questionnaire and have noted 1. The patient's medical and social history 2. Their use of alcohol, tobacco or illicit drugs 3. Their current medications and supplements 4. The patient's functional ability including ADL's, fall risks, home safety risks and hearing or visual             impairment. 5. Diet and physical activities 6. Evidence for depression or mood disorders The patients weight, height, BMI and visual acuity have been recorded in the chart I have made referrals, counseling and provided education to the patient based review of the above and I have provided the pt with a written personalized care plan for preventive services.  Hypertension:  Well controlled.. She stopped HCTZ.  Using medication without problems or lightheadedness: None Chest pain with exertion:None Edema:None Short of breath:None Average home BPs: Well controlled at OV. Other issues:  Elevated Cholesterol:   Lab Results  Component Value Date   CHOL 174 11/20/2012   HDL 105.50 11/20/2012   LDLCALC 54 11/20/2012   LDLDIRECT 114.6 11/16/2006   TRIG 71.0 11/20/2012   CHOLHDL 2 11/20/2012    Using medications without problems: Muscle aches:  Diet compliance: Exercise: Other complaints:  Has new oncologist at [facility]: Dr. Vallarie Mare. 4 years out from rectal cancer Plans doing a scan yearly instead of more frequently. Next due in 03/2013  Review of Systems  Constitutional: Negative for fever, fatigue and unexpected weight change.  HENT: Negative for ear pain, congestion, sore throat, sneezing, trouble swallowing and sinus pressure.   Eyes: Negative for pain and itching.  Respiratory: Negative for cough, shortness of breath and wheezing.   Cardiovascular: Negative for chest pain, palpitations and leg swelling.  Gastrointestinal: Positive for diarrhea.  Negative for nausea, abdominal pain, constipation and blood in stool.  Genitourinary: Negative for dysuria, hematuria, vaginal discharge, difficulty urinating and menstrual problem.  Skin: Negative for rash.  Neurological: Negative for syncope, weakness, light-headedness, numbness and headaches.  Psychiatric/Behavioral: Negative for confusion and dysphoric mood. The patient is not nervous/anxious.        Objective:   Physical Exam  Constitutional: Vital signs are normal. She appears well-developed and well-nourished. She is cooperative.  Non-toxic appearance. She does not appear ill. No distress.  HENT:  Head: Normocephalic.  Right Ear: Hearing, tympanic membrane, external ear and ear canal normal.  Left Ear: Hearing, tympanic membrane, external ear and ear canal normal.  Nose: Nose normal.  Eyes: Conjunctivae, EOM and lids are normal. Pupils are equal, round, and reactive to light. No foreign bodies found.  Neck: Trachea normal and normal range of motion. Neck supple. Carotid bruit is not present. No mass and no thyromegaly present.  Cardiovascular: Normal rate, regular rhythm, S1 normal, S2 normal, normal heart sounds and intact distal pulses.  Exam reveals no gallop.   No murmur heard. Pulmonary/Chest: Effort normal and breath sounds normal. No respiratory distress. She has no wheezes. She has no rhonchi. She has no rales.  Abdominal: Soft. Normal appearance and bowel sounds are normal. She exhibits no distension, no fluid wave, no abdominal bruit and no mass. There is no hepatosplenomegaly. There is no tenderness. There is no rebound, no guarding and no CVA tenderness. No hernia.  Genitourinary: No breast swelling, tenderness, discharge or bleeding. Pelvic exam was performed with patient supine.  Lymphadenopathy:    She  has no cervical adenopathy.    She has no axillary adenopathy.  Neurological: She is alert. She has normal strength. No cranial nerve deficit or sensory deficit.  Skin:  Skin is warm, dry and intact. No rash noted.  Psychiatric: Her speech is normal and behavior is normal. Judgment normal. Her mood appears not anxious. Cognition and memory are normal. She does not exhibit a depressed mood.          Assessment & Plan:  The patient's preventative maintenance and recommended screening tests for an annual wellness exam were reviewed in full today. Brought up to date unless services declined.  Counselled on the importance of diet, exercise, and its role in overall health and mortality. The patient's FH and SH was reviewed, including their home life, tobacco status, and drug and alcohol status.   Mammogram: Last nml 2011, had nml PET scan last year as well as abd and pelvic CT...plans repeat in 03/2013 Colon: History of rectal cancer followed by ONC. DEXA: Last  2004 Osteoporosis. Drink a lot of milk. Refuses DEXA given she is not interested in medicaiton to treat. Low vit D Vaccines: Uptodate with Td, PNA, flu, considering shingles vaccine Pap/DVE: not indicated due to age

## 2012-12-12 NOTE — Assessment & Plan Note (Signed)
At goal on diet control.

## 2012-12-23 ENCOUNTER — Ambulatory Visit: Payer: Self-pay | Admitting: Hematology and Oncology

## 2012-12-27 ENCOUNTER — Other Ambulatory Visit: Payer: Self-pay

## 2012-12-27 MED ORDER — DIPHENOXYLATE-ATROPINE 2.5-0.025 MG PO TABS: 1.0000 | ORAL_TABLET | Freq: Four times a day (QID) | ORAL | Status: DC | PRN

## 2012-12-27 NOTE — Telephone Encounter (Signed)
Pt left v/m requesting refill lomotil to [facility] [place].Please advise.  

## 2013-01-17 ENCOUNTER — Other Ambulatory Visit: Payer: Self-pay | Admitting: Family Medicine

## 2013-01-24 ENCOUNTER — Other Ambulatory Visit: Payer: Self-pay

## 2013-01-24 NOTE — Telephone Encounter (Signed)
Pt left v/m requesting refill lomotil and HCTZ to [facility]. Pt said has not taken HCTZ for couple of months; 01/23/13 pts ankles were swollen due to warmer weather; today ankles are not swollen, back to normal. Pt request to restart HCTZ since warm weather coming.Please advise.

## 2013-01-26 MED ORDER — HYDROCHLOROTHIAZIDE 25 MG PO TABS: ORAL_TABLET | ORAL | Status: DC

## 2013-01-26 MED ORDER — DIPHENOXYLATE-ATROPINE 2.5-0.025 MG PO TABS: 1.0000 | ORAL_TABLET | Freq: Four times a day (QID) | ORAL | Status: DC | PRN

## 2013-01-30 NOTE — Telephone Encounter (Signed)
rx called to pharmacy 

## 2013-02-21 ENCOUNTER — Other Ambulatory Visit: Payer: Self-pay | Admitting: *Deleted

## 2013-02-22 ENCOUNTER — Ambulatory Visit: Payer: Self-pay | Admitting: Hematology and Oncology

## 2013-02-22 MED ORDER — DIPHENOXYLATE-ATROPINE 2.5-0.025 MG PO TABS: 1.0000 | ORAL_TABLET | Freq: Four times a day (QID) | ORAL | Status: DC | PRN

## 2013-02-22 NOTE — Telephone Encounter (Signed)
Rx called in as directed.   

## 2013-03-06 LAB — COMPREHENSIVE METABOLIC PANEL
Albumin: 3.9 g/dL (ref 3.4–5.0)
Alkaline Phosphatase: 111 U/L (ref 50–136)
Anion Gap: 8 (ref 7–16)
BUN: 17 mg/dL (ref 7–18)
Bilirubin,Total: 0.7 mg/dL (ref 0.2–1.0)
Chloride: 101 mmol/L (ref 98–107)
Creatinine: 0.77 mg/dL (ref 0.60–1.30)
EGFR (African American): >60
EGFR (Non-African Amer.): >60
Glucose: 99 mg/dL (ref 65–99)
Osmolality: 279 (ref 275–301)
Potassium: 3.9 mmol/L (ref 3.5–5.1)
SGOT(AST): 14 U/L — ABNORMAL LOW (ref 15–37)
SGPT (ALT): 26 U/L (ref 12–78)
Sodium: 139 mmol/L (ref 136–145)

## 2013-03-06 LAB — CBC CANCER CENTER
Basophil #: 0 x10 3/mm (ref 0.0–0.1)
Basophil %: 0.9 %
Eosinophil #: 0.2 x10 3/mm (ref 0.0–0.7)
HCT: 42.6 % (ref 35.0–47.0)
Lymphocyte #: 1.5 x10 3/mm (ref 1.0–3.6)
MCH: 30.6 pg (ref 26.0–34.0)
MCV: 91 fL (ref 80–100)
Neutrophil #: 2.4 x10 3/mm (ref 1.4–6.5)
Neutrophil %: 51.9 %
Platelet: 192 x10 3/mm (ref 150–440)
RBC: 4.68 10*6/uL (ref 3.80–5.20)

## 2013-03-25 ENCOUNTER — Ambulatory Visit: Payer: Self-pay | Admitting: Hematology and Oncology

## 2013-03-26 ENCOUNTER — Other Ambulatory Visit: Payer: Self-pay | Admitting: Family Medicine

## 2013-03-26 NOTE — Telephone Encounter (Signed)
Pt left voicemail requesting refill of med

## 2013-03-27 MED ORDER — DIPHENOXYLATE-ATROPINE 2.5-0.025 MG PO TABS: 1.0000 | ORAL_TABLET | Freq: Four times a day (QID) | ORAL | Status: DC | PRN

## 2013-03-27 NOTE — Telephone Encounter (Signed)
rx called to pharmacy 

## 2013-03-31 ENCOUNTER — Emergency Department: Payer: Self-pay | Admitting: Emergency Medicine

## 2013-03-31 LAB — CBC
HCT: 42.1 % (ref 35.0–47.0)
HGB: 14.2 g/dL (ref 12.0–16.0)
MCH: 30.9 pg (ref 26.0–34.0)
MCHC: 33.8 g/dL (ref 32.0–36.0)
Platelet: 203 10*3/uL (ref 150–440)
RBC: 4.6 10*6/uL (ref 3.80–5.20)
WBC: 6.9 10*3/uL (ref 3.6–11.0)

## 2013-03-31 LAB — URINALYSIS, COMPLETE
Bilirubin,UR: NEGATIVE
Blood: NEGATIVE
Nitrite: NEGATIVE
Ph: 5 (ref 4.5–8.0)
Protein: NEGATIVE
Squamous Epithelial: 1
WBC UR: 19 /HPF (ref 0–5)

## 2013-03-31 LAB — COMPREHENSIVE METABOLIC PANEL
Alkaline Phosphatase: 101 U/L (ref 50–136)
BUN: 20 mg/dL — ABNORMAL HIGH (ref 7–18)
Bilirubin,Total: 0.8 mg/dL (ref 0.2–1.0)
Creatinine: 0.5 mg/dL — ABNORMAL LOW (ref 0.60–1.30)
EGFR (Non-African Amer.): >60
Glucose: 114 mg/dL — ABNORMAL HIGH (ref 65–99)
Osmolality: 285 (ref 275–301)
SGPT (ALT): 26 U/L (ref 12–78)
Sodium: 141 mmol/L (ref 136–145)
Total Protein: 7 g/dL (ref 6.4–8.2)

## 2013-03-31 LAB — LIPASE, BLOOD: Lipase: 94 U/L (ref 73–393)

## 2013-04-14 ENCOUNTER — Other Ambulatory Visit: Payer: Self-pay | Admitting: Family Medicine

## 2013-04-23 ENCOUNTER — Other Ambulatory Visit: Payer: Self-pay | Admitting: *Deleted

## 2013-04-23 MED ORDER — DIPHENOXYLATE-ATROPINE 2.5-0.025 MG PO TABS: 1.0000 | ORAL_TABLET | Freq: Four times a day (QID) | ORAL | Status: DC | PRN

## 2013-04-23 NOTE — Telephone Encounter (Signed)
rx called to pharmacy 

## 2013-05-22 ENCOUNTER — Other Ambulatory Visit: Payer: Self-pay

## 2013-05-22 MED ORDER — DIPHENOXYLATE-ATROPINE 2.5-0.025 MG PO TABS: 1.0000 | ORAL_TABLET | Freq: Four times a day (QID) | ORAL | Status: DC | PRN

## 2013-05-22 NOTE — Telephone Encounter (Signed)
Pt left v/m requesting refill lomotil to [facility].Please advise.

## 2013-05-22 NOTE — Telephone Encounter (Signed)
Medication phoned to [facility] as instructed. Spoke with Melonie. Pt notified refill done.

## 2013-06-18 ENCOUNTER — Other Ambulatory Visit: Payer: Self-pay | Admitting: Family Medicine

## 2013-06-22 ENCOUNTER — Other Ambulatory Visit: Payer: Self-pay

## 2013-06-22 MED ORDER — DIPHENOXYLATE-ATROPINE 2.5-0.025 MG PO TABS: 1.0000 | ORAL_TABLET | Freq: Four times a day (QID) | ORAL | Status: DC | PRN

## 2013-06-22 NOTE — Telephone Encounter (Signed)
Pt left v/m requesting refill lomotil to [facility] [place].Please advise.  

## 2013-06-22 NOTE — Telephone Encounter (Signed)
Rx called to pharmacy

## 2013-07-20 ENCOUNTER — Other Ambulatory Visit: Payer: Self-pay | Admitting: *Deleted

## 2013-07-20 MED ORDER — DIPHENOXYLATE-ATROPINE 2.5-0.025 MG PO TABS: 1.0000 | ORAL_TABLET | Freq: Four times a day (QID) | ORAL | Status: DC | PRN

## 2013-07-20 NOTE — Telephone Encounter (Signed)
Last OV 12/12/2012.  Ok to refill?

## 2013-07-20 NOTE — Telephone Encounter (Signed)
Left message with husband that prescription has been sent to pharmacy.

## 2013-07-20 NOTE — Telephone Encounter (Signed)
Called to pharmacy 

## 2013-08-22 ENCOUNTER — Other Ambulatory Visit: Payer: Self-pay

## 2013-08-22 NOTE — Telephone Encounter (Signed)
Pt  Left v/m requesting lomotil refill to [facility].Please advise.

## 2013-08-23 MED ORDER — DIPHENOXYLATE-ATROPINE 2.5-0.025 MG PO TABS: 1.0000 | ORAL_TABLET | Freq: Four times a day (QID) | ORAL | Status: DC | PRN

## 2013-08-23 NOTE — Telephone Encounter (Signed)
Called to [address]

## 2013-08-23 NOTE — Telephone Encounter (Signed)
Mea notified prescription has been called in to [facility].

## 2013-08-23 NOTE — Telephone Encounter (Signed)
Called to [facility] [address] 

## 2013-09-21 ENCOUNTER — Other Ambulatory Visit: Payer: Self-pay | Admitting: *Deleted

## 2013-09-25 ENCOUNTER — Other Ambulatory Visit: Payer: Self-pay | Admitting: *Deleted

## 2013-09-25 MED ORDER — DIPHENOXYLATE-ATROPINE 2.5-0.025 MG PO TABS: 1.0000 | ORAL_TABLET | Freq: Four times a day (QID) | ORAL | Status: DC | PRN

## 2013-09-25 NOTE — Telephone Encounter (Signed)
Lomotil was approved from 09/21/13 request.

## 2013-09-25 NOTE — Telephone Encounter (Signed)
Pt spoke with Bonita Quin T about status of lomotil refill; Medication phoned to [facility] pharmacy as instructed.Linda notified pt refill done.

## 2013-09-27 ENCOUNTER — Ambulatory Visit: Payer: Self-pay | Admitting: Hematology and Oncology

## 2013-09-27 LAB — CBC CANCER CENTER
Basophil #: 0 x10 3/mm (ref 0.0–0.1)
Basophil %: 1.1 %
Eosinophil #: 0.2 x10 3/mm (ref 0.0–0.7)
HCT: 41.7 % (ref 35.0–47.0)
HGB: 13.7 g/dL (ref 12.0–16.0)
Lymphocyte %: 35.1 %
MCH: 30.3 pg (ref 26.0–34.0)
MCV: 93 fL (ref 80–100)
Monocyte #: 0.6 x10 3/mm (ref 0.2–0.9)
Monocyte %: 12.4 %
Neutrophil #: 2.1 x10 3/mm (ref 1.4–6.5)
Neutrophil %: 46.4 %
Platelet: 185 x10 3/mm (ref 150–440)
RBC: 4.51 10*6/uL (ref 3.80–5.20)
RDW: 13.3 % (ref 11.5–14.5)
WBC: 4.5 x10 3/mm (ref 3.6–11.0)

## 2013-09-27 LAB — BASIC METABOLIC PANEL
Anion Gap: 8 (ref 7–16)
BUN: 16 mg/dL (ref 7–18)
Chloride: 104 mmol/L (ref 98–107)
Co2: 31 mmol/L (ref 21–32)
EGFR (African American): >60
Osmolality: 285 (ref 275–301)

## 2013-10-02 LAB — CEA: CEA: 2.4 ng/mL (ref 0.0–4.7)

## 2013-10-16 ENCOUNTER — Other Ambulatory Visit: Payer: Self-pay

## 2013-10-16 MED ORDER — DIPHENOXYLATE-ATROPINE 2.5-0.025 MG PO TABS: 1.0000 | ORAL_TABLET | Freq: Four times a day (QID) | ORAL | Status: DC | PRN

## 2013-10-16 NOTE — Telephone Encounter (Signed)
Called to [facility] [address] 

## 2013-10-16 NOTE — Telephone Encounter (Signed)
Last office visit 12/12/2012.  Ok to refill?

## 2013-10-16 NOTE — Telephone Encounter (Signed)
Pt request refill lomotil to [facility]. Call pt when med has been called in.

## 2013-10-16 NOTE — Telephone Encounter (Signed)
Left message with husband that prescription has been called to [facility].

## 2013-10-25 ENCOUNTER — Ambulatory Visit: Payer: Self-pay | Admitting: Hematology and Oncology

## 2013-11-19 ENCOUNTER — Other Ambulatory Visit: Payer: Self-pay

## 2013-11-19 NOTE — Telephone Encounter (Signed)
Last office visit 12/12/2012.  CPX scheduled for 12/14/2013.  Ok to refill?

## 2013-11-19 NOTE — Telephone Encounter (Signed)
Pt left v/m requesting refill lomotil to [facility] [place].Please advise.  

## 2013-11-20 MED ORDER — DIPHENOXYLATE-ATROPINE 2.5-0.025 MG PO TABS: 1.0000 | ORAL_TABLET | Freq: Four times a day (QID) | ORAL | Status: DC | PRN

## 2013-11-20 NOTE — Telephone Encounter (Signed)
Called to [facility] [address] 

## 2013-11-20 NOTE — Telephone Encounter (Signed)
Left message for [name] that prescription has been called to her pharmacy.

## 2013-12-06 ENCOUNTER — Telehealth: Payer: Self-pay | Admitting: Family Medicine

## 2013-12-06 DIAGNOSIS — M810 Age-related osteoporosis without current pathological fracture: Secondary | ICD-10-CM

## 2013-12-06 DIAGNOSIS — E785 Hyperlipidemia, unspecified: Secondary | ICD-10-CM

## 2013-12-06 DIAGNOSIS — E559 Vitamin D deficiency, unspecified: Secondary | ICD-10-CM

## 2013-12-06 NOTE — Telephone Encounter (Signed)
Message copied by Jinny Sanders on Thu Dec 06, 2013  5:33 PM ------      Message from: Ellamae Sia      Created: Wed Nov 28, 2013  6:18 PM      Regarding: Lab orders for Friday, 2.13.15       Patient is scheduled for CPX labs, please order future labs, Thanks , Terri       ------

## 2013-12-07 ENCOUNTER — Other Ambulatory Visit (INDEPENDENT_AMBULATORY_CARE_PROVIDER_SITE_OTHER): Payer: Medicare Other

## 2013-12-07 DIAGNOSIS — E785 Hyperlipidemia, unspecified: Secondary | ICD-10-CM

## 2013-12-07 DIAGNOSIS — M810 Age-related osteoporosis without current pathological fracture: Secondary | ICD-10-CM

## 2013-12-07 DIAGNOSIS — E559 Vitamin D deficiency, unspecified: Secondary | ICD-10-CM

## 2013-12-07 DIAGNOSIS — I10 Essential (primary) hypertension: Secondary | ICD-10-CM

## 2013-12-07 LAB — COMPREHENSIVE METABOLIC PANEL
ALBUMIN: 4 g/dL (ref 3.5–5.2)
ALT: 16 U/L (ref 0–35)
AST: 15 U/L (ref 0–37)
Alkaline Phosphatase: 73 U/L (ref 39–117)
BILIRUBIN TOTAL: 1.1 mg/dL (ref 0.3–1.2)
BUN: 18 mg/dL (ref 6–23)
CO2: 27 mEq/L (ref 19–32)
Calcium: 9.6 mg/dL (ref 8.4–10.5)
Chloride: 107 mEq/L (ref 96–112)
Creatinine, Ser: 0.6 mg/dL (ref 0.4–1.2)
GFR: 96.97 mL/min (ref >60.00)
GLUCOSE: 81 mg/dL (ref 70–99)
POTASSIUM: 3.8 meq/L (ref 3.5–5.1)
SODIUM: 140 meq/L (ref 135–145)
TOTAL PROTEIN: 6.6 g/dL (ref 6.0–8.3)

## 2013-12-07 LAB — LIPID PANEL
Cholesterol: 155 mg/dL (ref 0–200)
HDL: 90.7 mg/dL (ref >39.00)
LDL CALC: 53 mg/dL (ref 0–99)
Total CHOL/HDL Ratio: 2
Triglycerides: 56 mg/dL (ref 0.0–149.0)
VLDL: 11.2 mg/dL (ref 0.0–40.0)

## 2013-12-08 LAB — VITAMIN D 25 HYDROXY (VIT D DEFICIENCY, FRACTURES): VIT D 25 HYDROXY: 30 ng/mL (ref 30–89)

## 2013-12-14 ENCOUNTER — Encounter: Payer: Self-pay | Admitting: Family Medicine

## 2013-12-14 ENCOUNTER — Ambulatory Visit (INDEPENDENT_AMBULATORY_CARE_PROVIDER_SITE_OTHER): Payer: Medicare Other | Admitting: Family Medicine

## 2013-12-14 VITALS — BP 130/80 | HR 85 | Temp 97.8°F | Ht 63.75 in | Wt 106.5 lb

## 2013-12-14 DIAGNOSIS — Z0000 Encounter for general adult medical examination without abnormal findings: Secondary | ICD-10-CM

## 2013-12-14 DIAGNOSIS — E785 Hyperlipidemia, unspecified: Secondary | ICD-10-CM

## 2013-12-14 DIAGNOSIS — Z23 Encounter for immunization: Secondary | ICD-10-CM

## 2013-12-14 DIAGNOSIS — I10 Essential (primary) hypertension: Secondary | ICD-10-CM

## 2013-12-14 NOTE — Progress Notes (Signed)
Pre visit review using our clinic review tool, if applicable. No additional management support is needed unless otherwise documented below in the visit note. 

## 2013-12-14 NOTE — Progress Notes (Signed)
I have personally reviewed the Medicare Annual Wellness questionnaire and have noted  1. The patient's medical and social history  2. Their use of alcohol, tobacco or illicit drugs  3. Their current medications and supplements  4. The patient's functional ability including ADL's, fall risks, home safety risks and hearing or visual  impairment.  5. Diet and physical activities  6. Evidence for depression or mood disorders  The patients weight, height, BMI and visual acuity have been recorded in the chart  I have made referrals, counseling and provided education to the patient based review of the above and I have provided the pt with a written personalized care plan for preventive services.   Hypertension: Well controlled on HCTZ.  BP Readings from Last 3 Encounters:  12/14/13 130/80  12/12/12 120/70  11/17/12 120/72  Using medication without problems or lightheadedness: None  Chest pain with exertion:None  Edema:None  Short of breath:None  Average home BPs: Well controlled   Other issues:   Elevated Cholesterol:  Well controlled Lab Results  Component Value Date   CHOL 155 12/07/2013   HDL 90.70 12/07/2013   LDLCALC 53 12/07/2013   LDLDIRECT 114.6 11/16/2006   TRIG 56.0 12/07/2013   CHOLHDL 2 12/07/2013  Diet compliance: Good Exercise: walking when weather is warm. Other complaints:   Has new oncologist at [facility]: Dr. Grant Ruts.  5 years out from rectal cancer.   She is seeing GI in [employer]... 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  Using lomotil , immodium, cholestipol.  Review of Systems  Constitutional: Negative for fever, fatigue and unexpected weight change.  HENT: Negative for ear pain, congestion, sore throat, sneezing, trouble swallowing and sinus pressure.  Eyes: Negative for pain and itching.  Respiratory: Negative for cough, shortness of breath and wheezing.  Cardiovascular: Negative for chest pain, palpitations and leg swelling.   Gastrointestinal: Positive for diarrhea. Negative for nausea, abdominal pain, constipation and blood in stool.  Genitourinary: Negative for dysuria, hematuria, vaginal discharge, difficulty urinating and menstrual problem.  Skin: Negative for rash.  Neurological: Negative for syncope, weakness, light-headedness, numbness and headaches.  Psychiatric/Behavioral: Negative for confusion and dysphoric mood. The patient is not nervous/anxious.  Objective:   Physical Exam  Constitutional: Vital signs are normal. She appears well-developed and well-nourished. She is cooperative. Non-toxic appearance. She does not appear ill. No distress.  HENT:  Head: Normocephalic.  Right Ear: Hearing, tympanic membrane, external ear and ear canal normal.  Left Ear: Hearing, tympanic membrane, external ear and ear canal normal.  Nose: Nose normal.  Eyes: Conjunctivae, EOM and lids are normal. Pupils are equal, round, and reactive to light. No foreign bodies found.  Neck: Trachea normal and normal range of motion. Neck supple. Carotid bruit is not present. No mass and no thyromegaly present.  Cardiovascular: Normal rate, regular rhythm, S1 normal, S2 normal, normal heart sounds and intact distal pulses. Exam reveals no gallop.  No murmur heard.  Pulmonary/Chest: Effort normal and breath sounds normal. No respiratory distress. She has no wheezes. She has no rhonchi. She has no rales.  Abdominal: Soft. Normal appearance and bowel sounds are normal. She exhibits no distension, no fluid wave, no abdominal bruit and no mass. There is no hepatosplenomegaly. There is no tenderness. There is no rebound, no guarding and no CVA tenderness. No hernia.  Genitourinary: No breast swelling, tenderness, discharge or bleeding. Pelvic exam was performed with patient supine.  Lymphadenopathy:  She has no cervical adenopathy.  She has no  axillary adenopathy.  Neurological: She is alert. She has normal strength. No cranial nerve deficit  or sensory deficit.  Skin: Skin is warm, dry and intact. No rash noted.  Psychiatric: Her speech is normal and behavior is normal. Judgment normal. Her mood appears not anxious. Cognition and memory are normal. She does not exhibit a depressed mood.  Assessment & Plan:   The patient's preventative maintenance and recommended screening tests for an annual wellness exam were reviewed in full today.  Brought up to date unless services declined.  Counselled on the importance of diet, exercise, and its role in overall health and mortality.  The patient's FH and SH was reviewed, including their home life, tobacco status, and drug and alcohol status.   Mammogram: last nml 2012, She is not interested in continuing to screen for breast cancer. Colon: History of rectal cancer followed by ONC and GI.  Last nml  In last year. DEXA: Last 2004 Osteoporosis. Drink a lot of milk. Refuses DEXA given she is not interested in medication to treat. Low vit D  Vaccines: Uptodate with Td, PNA, she did no get the the flu, considering shingles vaccine  Pap/DVE: not indicated due to age

## 2013-12-14 NOTE — Assessment & Plan Note (Addendum)
Well controlled off med. Okay to remain off HCTZ.

## 2013-12-14 NOTE — Patient Instructions (Addendum)
Continue vit D supplement, try to take more consistently.  If BP staying < 130/80 can stay off  HCTZ.  Consider shingles vaccine.  Work on [employer], regular exercise.

## 2013-12-14 NOTE — Assessment & Plan Note (Signed)
Well controlled on no med. 

## 2013-12-17 ENCOUNTER — Telehealth: Payer: Self-pay | Admitting: Family Medicine

## 2013-12-17 NOTE — Telephone Encounter (Signed)
Relevant patient education assigned to patient using Emmi. ° °

## 2013-12-21 ENCOUNTER — Other Ambulatory Visit: Payer: Self-pay

## 2013-12-21 MED ORDER — DIPHENOXYLATE-ATROPINE 2.5-0.025 MG PO TABS: 1.0000 | ORAL_TABLET | Freq: Four times a day (QID) | ORAL | Status: DC | PRN

## 2013-12-21 NOTE — Telephone Encounter (Signed)
Pt left vm requesting [facility]Please advise.  

## 2013-12-21 NOTE — Telephone Encounter (Signed)
Called to [facility] [address] 

## 2014-01-21 ENCOUNTER — Other Ambulatory Visit: Payer: Self-pay

## 2014-01-21 MED ORDER — DIPHENOXYLATE-ATROPINE 2.5-0.025 MG PO TABS: 1.0000 | ORAL_TABLET | Freq: Four times a day (QID) | ORAL | Status: DC | PRN

## 2014-01-21 NOTE — Telephone Encounter (Signed)
Pt left v/m requesting refill lomotil to [facility] [place].Please advise.  

## 2014-01-21 NOTE — Telephone Encounter (Signed)
Last office visit 12/14/2013.  Ok to refill?

## 2014-01-22 NOTE — Telephone Encounter (Signed)
Called to [facility] [address] 

## 2014-02-08 ENCOUNTER — Telehealth: Payer: Self-pay | Admitting: *Deleted

## 2014-02-08 NOTE — Telephone Encounter (Signed)
Noted  

## 2014-02-08 NOTE — Telephone Encounter (Signed)
Received Tier Cost Sharing Request Form from [facility] for Diltiazem 240 mg CD.  Form completed and faxed back on 02/05/2014.  Diltiazem CD is denied for a tiering exception.  Patient needs to try all other covered medications used to treat her condition in Tier 2. Diltiazem-CD is on her plan's drug list and will continue to be covered at the Tier 3 co-pay.  [name] notified.  She will continue to pay the Tier 3 copay.

## 2014-02-19 ENCOUNTER — Other Ambulatory Visit: Payer: Self-pay

## 2014-02-19 MED ORDER — DIPHENOXYLATE-ATROPINE 2.5-0.025 MG PO TABS: 1.0000 | ORAL_TABLET | Freq: Four times a day (QID) | ORAL | Status: DC | PRN

## 2014-02-19 NOTE — Telephone Encounter (Signed)
Pt left v/m requesting lomotil refill to [facility].Please advise.

## 2014-02-20 NOTE — Telephone Encounter (Signed)
Phoned in to pharmacy. 

## 2014-03-20 ENCOUNTER — Other Ambulatory Visit: Payer: Self-pay

## 2014-03-20 NOTE — Telephone Encounter (Signed)
Last office visit 12/14/2013.  Ok to refill? 

## 2014-03-20 NOTE — Telephone Encounter (Signed)
Pt left v/m requesting refill l[facility]il to [facility] [place]. Pt request cb when refilled.

## 2014-03-21 MED ORDER — DIPHENOXYLATE-ATROPINE 2.5-0.025 MG PO TABS: 1.0000 | ORAL_TABLET | Freq: Four times a day (QID) | ORAL | Status: DC | PRN

## 2014-03-21 NOTE — Telephone Encounter (Signed)
[name] notified prescription has been called in to her pharmacy.

## 2014-03-21 NOTE — Telephone Encounter (Addendum)
Called to [facility] [address] 

## 2014-04-08 ENCOUNTER — Other Ambulatory Visit: Payer: Self-pay

## 2014-04-08 NOTE — Telephone Encounter (Signed)
See note. Is it okay to refill medication?

## 2014-04-08 NOTE — Telephone Encounter (Signed)
Pt said Dr Diona Browner had stopped HCTZ; now that warmer weather is here pt experiencing swelling in both ankles; no swelling in lower legs and no SOB. Pt elevates legs when sitting but pt request refill HCTZ for prn use of swelling of ankles to [facility]. Please advise.

## 2014-04-09 MED ORDER — HYDROCHLOROTHIAZIDE 25 MG PO TABS: ORAL_TABLET | ORAL | Status: DC

## 2014-04-17 ENCOUNTER — Other Ambulatory Visit: Payer: Self-pay

## 2014-04-17 MED ORDER — DIPHENOXYLATE-ATROPINE 2.5-0.025 MG PO TABS: 1.0000 | ORAL_TABLET | Freq: Four times a day (QID) | ORAL | Status: DC | PRN

## 2014-04-17 NOTE — Telephone Encounter (Signed)
Pt request refill of lomotil to [facility]; pt is going out of town on 04/19/14 but pt is already out of med; pt said she is not sure why she ran out early. Please advise.

## 2014-04-18 NOTE — Telephone Encounter (Signed)
Called to [facility] [address] 

## 2014-04-18 NOTE — Telephone Encounter (Signed)
Mrs. Lewing notified prescription has been call in to her pharmacy.

## 2014-05-22 ENCOUNTER — Other Ambulatory Visit: Payer: Self-pay | Admitting: Family Medicine

## 2014-05-22 MED ORDER — DIPHENOXYLATE-ATROPINE 2.5-0.025 MG PO TABS: 1.0000 | ORAL_TABLET | Freq: Four times a day (QID) | ORAL | Status: DC | PRN

## 2014-05-22 NOTE — Telephone Encounter (Signed)
Rx called to pharmacy

## 2014-05-22 NOTE — Telephone Encounter (Signed)
PRN medication.  Last filled 04/17/2014.  Ok to fill?

## 2014-06-15 ENCOUNTER — Other Ambulatory Visit: Payer: Self-pay | Admitting: Family Medicine

## 2014-06-21 ENCOUNTER — Other Ambulatory Visit: Payer: Self-pay

## 2014-06-21 MED ORDER — DIPHENOXYLATE-ATROPINE 2.5-0.025 MG PO TABS: 1.0000 | ORAL_TABLET | Freq: Four times a day (QID) | ORAL | Status: DC | PRN

## 2014-06-21 NOTE — Telephone Encounter (Signed)
Pt left v/m requesting refill lomotil to [facility].Please advise.

## 2014-06-21 NOTE — Telephone Encounter (Signed)
Called to [facility] [address] 

## 2014-06-28 ENCOUNTER — Other Ambulatory Visit: Payer: Self-pay | Admitting: *Deleted

## 2014-06-28 MED ORDER — HYDROCHLOROTHIAZIDE 25 MG PO TABS: ORAL_TABLET | ORAL | Status: DC

## 2014-07-24 ENCOUNTER — Other Ambulatory Visit: Payer: Self-pay

## 2014-07-24 NOTE — Telephone Encounter (Signed)
Pt left vm requesting refill l[facility]il [facility] [place].Please advise.

## 2014-07-24 NOTE — Telephone Encounter (Signed)
Last office visit 12/14/2013.  Last refilled 06/21/2014 for #120 with no refills.  Ok to refill?

## 2014-07-26 MED ORDER — DIPHENOXYLATE-ATROPINE 2.5-0.025 MG PO TABS: 1.0000 | ORAL_TABLET | Freq: Four times a day (QID) | ORAL | Status: DC | PRN

## 2014-07-26 NOTE — Telephone Encounter (Signed)
Called to [facility] [address] 

## 2014-08-28 ENCOUNTER — Other Ambulatory Visit: Payer: Self-pay | Admitting: *Deleted

## 2014-08-29 MED ORDER — DIPHENOXYLATE-ATROPINE 2.5-0.025 MG PO TABS: 1.0000 | ORAL_TABLET | Freq: Four times a day (QID) | ORAL | Status: DC | PRN

## 2014-08-29 NOTE — Telephone Encounter (Signed)
Called to [facility] [address] 

## 2014-09-23 ENCOUNTER — Other Ambulatory Visit: Payer: Self-pay | Admitting: Family Medicine

## 2014-10-02 ENCOUNTER — Other Ambulatory Visit: Payer: Self-pay

## 2014-10-02 MED ORDER — DIPHENOXYLATE-ATROPINE 2.5-0.025 MG PO TABS: 1.0000 | ORAL_TABLET | Freq: Four times a day (QID) | ORAL | Status: DC | PRN

## 2014-10-02 NOTE — Telephone Encounter (Signed)
Pt left v/m requesting refill lomotil to [facility].Please advise. Pt last seen 12/14/13.

## 2014-10-03 ENCOUNTER — Other Ambulatory Visit: Payer: Self-pay | Admitting: Family Medicine

## 2014-10-03 NOTE — Telephone Encounter (Signed)
Called to [place].

## 2014-10-28 ENCOUNTER — Other Ambulatory Visit: Payer: Self-pay

## 2014-10-28 NOTE — Telephone Encounter (Signed)
Pt left v/m requesting refill l[facility]is [facility] [place].Please advise. Pt last seen 12/14/13 and lomotil # 120 last filled 10/02/2014.

## 2014-10-29 MED ORDER — DIPHENOXYLATE-ATROPINE 2.5-0.025 MG PO TABS: 1.0000 | ORAL_TABLET | Freq: Four times a day (QID) | ORAL | Status: DC | PRN

## 2014-10-29 NOTE — Telephone Encounter (Signed)
Called to [place]

## 2014-11-20 DIAGNOSIS — R197 Diarrhea, unspecified: Secondary | ICD-10-CM | POA: Diagnosis not present

## 2014-11-20 DIAGNOSIS — Z85038 Personal history of other malignant neoplasm of large intestine: Secondary | ICD-10-CM | POA: Diagnosis not present

## 2014-11-25 ENCOUNTER — Other Ambulatory Visit: Payer: Self-pay | Admitting: *Deleted

## 2014-11-25 MED ORDER — DIPHENOXYLATE-ATROPINE 2.5-0.025 MG PO TABS: 1.0000 | ORAL_TABLET | Freq: Four times a day (QID) | ORAL | Status: DC | PRN

## 2014-11-25 NOTE — Telephone Encounter (Signed)
Called to [place]

## 2014-11-25 NOTE — Telephone Encounter (Signed)
Patient left a voicemail requesting refill on Lomotil. Last refill 10/29/14 #120, last office visit 12/14/13. Is it okay to refill medication?

## 2014-12-22 ENCOUNTER — Other Ambulatory Visit: Payer: Self-pay | Admitting: Family Medicine

## 2014-12-22 NOTE — Telephone Encounter (Signed)
Please call and schedule CPE with fasting labs prior with Dr. Bedsole. 

## 2014-12-30 ENCOUNTER — Other Ambulatory Visit: Payer: Self-pay | Admitting: Family Medicine

## 2015-01-02 ENCOUNTER — Telehealth: Payer: Self-pay

## 2015-01-02 NOTE — Telephone Encounter (Signed)
Pt left v/m requesting refill lomotil to [facility]. Please advise. Pt last seen 12/14/13 and pt does not have future appt scheduled.

## 2015-01-02 NOTE — Telephone Encounter (Signed)
Pt needs to schedule Medicare wellness with labs prior, refill until then

## 2015-01-02 NOTE — Telephone Encounter (Signed)
Please call and schedule Medicare Wellness with fasting labs prior with Dr. Diona Browner.  Please route back to me after appointment is scheduled and I will refill her medication.

## 2015-01-03 MED ORDER — DIPHENOXYLATE-ATROPINE 2.5-0.025 MG PO TABS: 1.0000 | ORAL_TABLET | Freq: Four times a day (QID) | ORAL | Status: DC | PRN

## 2015-01-03 NOTE — Telephone Encounter (Signed)
Scheduled [facility] Wellness for 01/24/15 with Dr. Diona Browner and made fasting lab appt for 3/28.  Please refill for patient

## 2015-01-03 NOTE — Addendum Note (Signed)
Addended by: Carter Kitten on: 01/03/2015 09:40 AM   Modules accepted: Orders

## 2015-01-03 NOTE — Telephone Encounter (Signed)
Called to [place]

## 2015-01-16 ENCOUNTER — Telehealth: Payer: Self-pay | Admitting: Family Medicine

## 2015-01-16 DIAGNOSIS — E559 Vitamin D deficiency, unspecified: Secondary | ICD-10-CM

## 2015-01-16 DIAGNOSIS — E785 Hyperlipidemia, unspecified: Secondary | ICD-10-CM

## 2015-01-16 DIAGNOSIS — M810 Age-related osteoporosis without current pathological fracture: Secondary | ICD-10-CM

## 2015-01-16 NOTE — Telephone Encounter (Signed)
-----   Message from Ellamae Sia sent at 01/15/2015  6:24 PM EDT ----- Regarding: Lab orders for Monday, 3.28.16 Patient is scheduled for CPX labs, please order future labs, Thanks , Karna Christmas

## 2015-01-20 ENCOUNTER — Other Ambulatory Visit (INDEPENDENT_AMBULATORY_CARE_PROVIDER_SITE_OTHER): Payer: Medicare Other

## 2015-01-20 DIAGNOSIS — E785 Hyperlipidemia, unspecified: Secondary | ICD-10-CM

## 2015-01-20 DIAGNOSIS — M810 Age-related osteoporosis without current pathological fracture: Secondary | ICD-10-CM | POA: Diagnosis not present

## 2015-01-20 LAB — VITAMIN D 25 HYDROXY (VIT D DEFICIENCY, FRACTURES): VITD: 8.96 ng/mL — ABNORMAL LOW (ref 30.00–100.00)

## 2015-01-20 LAB — COMPREHENSIVE METABOLIC PANEL
ALBUMIN: 3.9 g/dL (ref 3.5–5.2)
ALK PHOS: 64 U/L (ref 39–117)
ALT: 15 U/L (ref 0–35)
AST: 14 U/L (ref 0–37)
BUN: 20 mg/dL (ref 6–23)
CALCIUM: 10.1 mg/dL (ref 8.4–10.5)
CO2: 32 mEq/L (ref 19–32)
Chloride: 101 mEq/L (ref 96–112)
Creatinine, Ser: 0.74 mg/dL (ref 0.40–1.20)
GFR: 80.3 mL/min (ref >60.00)
GLUCOSE: 92 mg/dL (ref 70–99)
Potassium: 3.4 mEq/L — ABNORMAL LOW (ref 3.5–5.1)
Sodium: 140 mEq/L (ref 135–145)
Total Bilirubin: 0.9 mg/dL (ref 0.2–1.2)
Total Protein: 6.5 g/dL (ref 6.0–8.3)

## 2015-01-20 LAB — LIPID PANEL
CHOLESTEROL: 151 mg/dL (ref 0–200)
HDL: 92 mg/dL (ref >39.00)
LDL Cholesterol: 48 mg/dL (ref 0–99)
NONHDL: 59
Total CHOL/HDL Ratio: 2
Triglycerides: 54 mg/dL (ref 0.0–149.0)
VLDL: 10.8 mg/dL (ref 0.0–40.0)

## 2015-01-24 ENCOUNTER — Encounter: Payer: Self-pay | Admitting: Family Medicine

## 2015-01-24 ENCOUNTER — Ambulatory Visit (INDEPENDENT_AMBULATORY_CARE_PROVIDER_SITE_OTHER): Payer: Medicare Other | Admitting: Family Medicine

## 2015-01-24 VITALS — BP 128/64 | HR 88 | Temp 97.7°F | Resp 16 | Ht 63.5 in | Wt 105.4 lb

## 2015-01-24 DIAGNOSIS — I10 Essential (primary) hypertension: Secondary | ICD-10-CM

## 2015-01-24 DIAGNOSIS — Z7189 Other specified counseling: Secondary | ICD-10-CM | POA: Insufficient documentation

## 2015-01-24 DIAGNOSIS — Z0000 Encounter for general adult medical examination without abnormal findings: Secondary | ICD-10-CM | POA: Diagnosis not present

## 2015-01-24 DIAGNOSIS — E559 Vitamin D deficiency, unspecified: Secondary | ICD-10-CM

## 2015-01-24 DIAGNOSIS — E785 Hyperlipidemia, unspecified: Secondary | ICD-10-CM

## 2015-01-24 MED ORDER — ERGOCALCIFEROL 1.25 MG (50000 UT) PO CAPS: 50000.0000 [IU] | ORAL_CAPSULE | ORAL | Status: DC

## 2015-01-24 NOTE — Patient Instructions (Addendum)
Eat high potassium foods. Complete 12 weeks of vit D, then follow with OTC Vit D 400 IU 1-2 times a day. Look into insurance for coverage on Tdap.     Hypokalemia Hypokalemia means that the amount of potassium in the blood is lower than normal.Potassium is a chemical, called an electrolyte, that helps regulate the amount of fluid in the body. It also stimulates muscle contraction and helps nerves function properly.Most of the body's potassium is inside of cells, and only a very small amount is in the blood. Because the amount in the blood is so small, minor changes can be life-threatening. CAUSES  Antibiotics.  Diarrhea or vomiting.  Using laxatives too much, which can cause diarrhea.  Chronic kidney disease.  Water pills (diuretics).  Eating disorders (bulimia).  Low magnesium level.  Sweating a lot. SIGNS AND SYMPTOMS  Weakness.  Constipation.  Fatigue.  Muscle cramps.  Mental confusion.  Skipped heartbeats or irregular heartbeat (palpitations).  Tingling or numbness. DIAGNOSIS  Your health care provider can diagnose hypokalemia with blood tests. In addition to checking your potassium level, your health care provider may also check other lab tests. TREATMENT Hypokalemia can be treated with potassium supplements taken by mouth or adjustments in your current medicines. If your potassium level is very low, you may need to get potassium through a vein (IV) and be monitored in the hospital. A diet high in potassium is also helpful. Foods high in potassium are:  Nuts, such as peanuts and pistachios.  Seeds, such as sunflower seeds and pumpkin seeds.  Peas, lentils, and lima beans.  Whole grain and bran cereals and breads.  Fresh fruit and vegetables, such as apricots, avocado, bananas, cantaloupe, kiwi, oranges, tomatoes, asparagus, and potatoes.  Orange and tomato juices.  Red meats.  Fruit yogurt. HOME CARE INSTRUCTIONS  Take all medicines as prescribed  by your health care provider.  Maintain a healthy diet by including nutritious food, such as fruits, vegetables, nuts, whole grains, and lean meats.  If you are taking a laxative, be sure to follow the directions on the label. SEEK MEDICAL CARE IF:  Your weakness gets worse.  You feel your heart pounding or racing.  You are vomiting or having diarrhea.  You are diabetic and having trouble keeping your blood glucose in the normal range. SEEK IMMEDIATE MEDICAL CARE IF:  You have chest pain, shortness of breath, or dizziness.  You are vomiting or having diarrhea for more than 2 days.  You faint. MAKE SURE YOU:   Understand these instructions.  Will watch your condition.  Will get help right away if you are not doing well or get worse. Document Released: 10/11/2005 Document Revised: 08/01/2013 Document Reviewed: 04/13/2013 [facility] Patient Information 2015 [place], [place]. This information is not intended to replace advice given to you by your health care provider. Make sure you discuss any questions you have with your health care provider.

## 2015-01-24 NOTE — Progress Notes (Signed)
Pre visit review using our clinic review tool, if applicable. No additional management support is needed unless otherwise documented below in the visit note. 

## 2015-01-24 NOTE — Progress Notes (Signed)
I have personally reviewed the Medicare Annual Wellness questionnaire and have noted  1. The patient's medical and social history  2. Their use of alcohol, tobacco or illicit drugs  3. Their current medications and supplements  4. The patient's functional ability including ADL's, fall risks, home safety risks and hearing or visual  impairment.  5. Diet and physical activities  6. Evidence for depression or mood disorders  The patients weight, height, BMI and visual acuity have been recorded in the chart  I have made referrals, counseling and provided education to the patient based review of the above and I have provided the pt with a written personalized care plan for preventive services.   Hypertension: Well controlled on HCTZ using as needed.  BP Readings from Last 3 Encounters:  12/14/13 130/80  12/12/12 120/70  11/17/12 120/72  Using medication without problems or lightheadedness: None  Chest pain with exertion:None  Edema:occ Short of breath:None  Average home BPs: Well controlled at home Other issues:   Elevated Cholesterol: Well controlled Lab Results  Component Value Date   CHOL 151 01/20/2015   HDL 92.00 01/20/2015   LDLCALC 48 01/20/2015   LDLDIRECT 114.6 11/16/2006   TRIG 54.0 01/20/2015   CHOLHDL 2 01/20/2015  Diet compliance: Good Exercise: walking  Daily, yard work Other complaints:  Has new oncologist at [facility]: Dr. Grant Ruts.  6 years out from rectal cancer.   She is seeing GI in [facility]... 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 Using lomotil, colestipol, and now paregoric.  Wt Readings from Last 3 Encounters:  12/14/13 106 lb 8 oz (48.308 kg)  12/12/12 106 lb (48.081 kg)  11/17/12 104 lb 12 oz (47.514 kg)   Vit D low.  Review of Systems  Constitutional: Negative for fever, fatigue and unexpected weight change.  HENT: Negative for ear pain, congestion, sore throat, sneezing, trouble swallowing and sinus  pressure.  Eyes: Negative for pain and itching.  Respiratory: Negative for cough, shortness of breath and wheezing.  Cardiovascular: Negative for chest pain, palpitations and leg swelling.  Gastrointestinal: Positive for diarrhea. Negative for nausea, abdominal pain, constipation and blood in stool.  Genitourinary: Negative for dysuria, hematuria, vaginal discharge, difficulty urinating and menstrual problem.  Skin: Negative for rash.  Neurological: Negative for syncope, weakness, light-headedness, numbness and headaches.  Psychiatric/Behavioral: Negative for confusion and dysphoric mood. The patient is not nervous/anxious.  Objective:   Physical Exam  Constitutional: Vital signs are normal. She appears well-developed and well-nourished. She is cooperative. Non-toxic appearance. She does not appear ill. No distress.  HENT:  Head: Normocephalic.  Right Ear: Hearing, tympanic membrane, external ear and ear canal normal.  Left Ear: Hearing, tympanic membrane, external ear and ear canal normal.  Nose: Nose normal.  Eyes: Conjunctivae, EOM and lids are normal. Pupils are equal, round, and reactive to light. No foreign bodies found.  Neck: Trachea normal and normal range of motion. Neck supple. Carotid bruit is not present. No mass and no thyromegaly present.  Cardiovascular: Normal rate, regular rhythm, S1 normal, S2 normal, normal heart sounds and intact distal pulses. Exam reveals no gallop.  No murmur heard.  Pulmonary/Chest: Effort normal and breath sounds normal. No respiratory distress. She has no wheezes. She has no rhonchi. She has no rales.  Abdominal: Soft. Normal appearance and bowel sounds are normal. She exhibits no distension, no fluid wave, no abdominal bruit and no mass. There is no hepatosplenomegaly. There is no tenderness. There is no rebound, no guarding  and no CVA tenderness. No hernia.  Genitourinary: No breast swelling, tenderness, discharge or bleeding.  Pelvic exam was performed with patient supine.  Lymphadenopathy:  She has no cervical adenopathy.  She has no axillary adenopathy.  Neurological: She is alert. She has normal strength. No cranial nerve deficit or sensory deficit.  Skin: Skin is warm, dry and intact. No rash noted.  Psychiatric: Her speech is normal and behavior is normal. Judgment normal. Her mood appears not anxious. Cognition and memory are normal. She does not exhibit a depressed mood.  Assessment & Plan:   The patient's preventative maintenance and recommended screening tests for an annual wellness exam were reviewed in full today.  Brought up to date unless services declined.  Counselled on the importance of diet, exercise, and its role in overall health and mortality.  The patient's FH and SH was reviewed, including their home life, tobacco status, and drug and alcohol status.   Mammogram: last nml 2012, She is not interested in continuing to screen for breast cancer. Colon: History of rectal cancer followed by ONC and GI. Last nml In last year. DEXA: Last 2004 Osteoporosis. Drink a lot of milk. Refuses DEXA given she is not interested in medication to treat. Low vit  Vaccines: Uptodate with Td, PNA, she did no get the the flu, refused shingles vaccine Pap/DVE: not indicated due to age Nonsmoker

## 2015-02-19 NOTE — Assessment & Plan Note (Signed)
At goal on no med. 

## 2015-02-19 NOTE — Assessment & Plan Note (Signed)
-  replete °

## 2015-02-19 NOTE — Assessment & Plan Note (Signed)
Well controlled. Continue current medication.  

## 2015-02-24 ENCOUNTER — Other Ambulatory Visit: Payer: Self-pay | Admitting: *Deleted

## 2015-02-24 MED ORDER — DIPHENOXYLATE-ATROPINE 2.5-0.025 MG PO TABS: 1.0000 | ORAL_TABLET | Freq: Four times a day (QID) | ORAL | Status: DC | PRN

## 2015-02-24 NOTE — Telephone Encounter (Signed)
Patient left a voicemail requesting a refill on Lomotil. Last refill 01/03/15 #120, last office visit 01/24/15.

## 2015-02-25 NOTE — Telephone Encounter (Signed)
Medication phoned to pharmacy.  

## 2015-03-27 ENCOUNTER — Other Ambulatory Visit: Payer: Self-pay | Admitting: Family Medicine

## 2015-04-02 ENCOUNTER — Encounter: Payer: Self-pay | Admitting: Internal Medicine

## 2015-04-03 ENCOUNTER — Other Ambulatory Visit: Payer: Self-pay | Admitting: Family Medicine

## 2015-04-18 ENCOUNTER — Other Ambulatory Visit: Payer: Self-pay | Admitting: Family Medicine

## 2015-07-11 ENCOUNTER — Other Ambulatory Visit: Payer: Self-pay | Admitting: Family Medicine

## 2015-07-11 NOTE — Telephone Encounter (Signed)
Last office visit 01/24/2015.  Last refilled 04/18/2015 for #12 with no refills.  Last vitamin D level 01/20/2015 which was very low.  Ok to refill?

## 2015-10-13 ENCOUNTER — Other Ambulatory Visit: Payer: Self-pay | Admitting: *Deleted

## 2015-10-13 MED ORDER — DILTIAZEM HCL ER COATED BEADS 240 MG PO CP24: 240.0000 mg | ORAL_CAPSULE | Freq: Every day | ORAL | Status: DC

## 2015-11-25 DIAGNOSIS — R197 Diarrhea, unspecified: Secondary | ICD-10-CM | POA: Diagnosis not present

## 2015-11-25 DIAGNOSIS — C20 Malignant neoplasm of rectum: Secondary | ICD-10-CM | POA: Diagnosis not present

## 2015-12-04 DIAGNOSIS — M791 Myalgia: Secondary | ICD-10-CM | POA: Diagnosis not present

## 2015-12-04 DIAGNOSIS — J988 Other specified respiratory disorders: Secondary | ICD-10-CM | POA: Diagnosis not present

## 2015-12-22 ENCOUNTER — Other Ambulatory Visit: Payer: Self-pay | Admitting: Family Medicine

## 2015-12-29 DIAGNOSIS — J040 Acute laryngitis: Secondary | ICD-10-CM | POA: Diagnosis not present

## 2015-12-29 DIAGNOSIS — R300 Dysuria: Secondary | ICD-10-CM | POA: Diagnosis not present

## 2016-01-07 DIAGNOSIS — C20 Malignant neoplasm of rectum: Secondary | ICD-10-CM | POA: Diagnosis not present

## 2016-01-22 ENCOUNTER — Other Ambulatory Visit (INDEPENDENT_AMBULATORY_CARE_PROVIDER_SITE_OTHER): Payer: Medicare Other

## 2016-01-22 ENCOUNTER — Telehealth: Payer: Self-pay | Admitting: Family Medicine

## 2016-01-22 ENCOUNTER — Ambulatory Visit (INDEPENDENT_AMBULATORY_CARE_PROVIDER_SITE_OTHER): Payer: Medicare Other

## 2016-01-22 VITALS — BP 122/82 | HR 71 | Temp 97.7°F | Ht 64.0 in | Wt 109.5 lb

## 2016-01-22 DIAGNOSIS — E785 Hyperlipidemia, unspecified: Secondary | ICD-10-CM

## 2016-01-22 DIAGNOSIS — Z0000 Encounter for general adult medical examination without abnormal findings: Secondary | ICD-10-CM

## 2016-01-22 DIAGNOSIS — E559 Vitamin D deficiency, unspecified: Secondary | ICD-10-CM | POA: Diagnosis not present

## 2016-01-22 LAB — VITAMIN D 25 HYDROXY (VIT D DEFICIENCY, FRACTURES): VITD: 29.21 ng/mL — AB (ref 30.00–100.00)

## 2016-01-22 LAB — COMPREHENSIVE METABOLIC PANEL
ALBUMIN: 4.2 g/dL (ref 3.5–5.2)
ALK PHOS: 68 U/L (ref 39–117)
ALT: 12 U/L (ref 0–35)
AST: 15 U/L (ref 0–37)
BUN: 18 mg/dL (ref 6–23)
CHLORIDE: 99 meq/L (ref 96–112)
CO2: 32 mEq/L (ref 19–32)
Calcium: 10.1 mg/dL (ref 8.4–10.5)
Creatinine, Ser: 0.66 mg/dL (ref 0.40–1.20)
GFR: 91.4 mL/min (ref >60.00)
Glucose, Bld: 93 mg/dL (ref 70–99)
POTASSIUM: 3.3 meq/L — AB (ref 3.5–5.1)
SODIUM: 138 meq/L (ref 135–145)
Total Bilirubin: 1 mg/dL (ref 0.2–1.2)
Total Protein: 7 g/dL (ref 6.0–8.3)

## 2016-01-22 LAB — LIPID PANEL
CHOLESTEROL: 164 mg/dL (ref 0–200)
HDL: 92.9 mg/dL (ref >39.00)
LDL Cholesterol: 55 mg/dL (ref 0–99)
NONHDL: 71.05
Total CHOL/HDL Ratio: 2
Triglycerides: 82 mg/dL (ref 0.0–149.0)
VLDL: 16.4 mg/dL (ref 0.0–40.0)

## 2016-01-22 NOTE — Telephone Encounter (Signed)
-----   Message from Marchia Bond sent at 01/13/2016  1:30 PM EDT ----- Regarding: Cpx labs Thurs 3/30, need orders. Thanks! :-) Please order  future cpx labs for pt's upcoming lab appt. Thanks Aniceto Boss

## 2016-01-22 NOTE — Progress Notes (Signed)
Pre visit review using our clinic review tool, if applicable. No additional management support is needed unless otherwise documented below in the visit note. 

## 2016-01-22 NOTE — Patient Instructions (Signed)
[name] , Thank you for taking time to come for your Medicare Wellness Visit. I appreciate your ongoing commitment to your health goals. Please review the following plan we discussed and let me know if I can assist you in the future.    This is a list of the screening recommended for you and due dates:  Health Maintenance  Topic Date Due  . Flu Shot  05/25/2016*  . Tetanus Vaccine  12/23/2016*  . Shingles Vaccine  01/21/2017*  . DEXA scan (bone density measurement)  12/24/2023*  . Pneumonia vaccines  Completed  *Topic was postponed. The date shown is not the original due date.   Preventive Care for Adults  A healthy lifestyle and preventive care can promote health and wellness. Preventive health guidelines for adults include the following key practices.  . A routine yearly physical is a good way to check with your health care provider about your health and preventive screening. It is a chance to share any concerns and updates on your health and to receive a thorough exam.  . Visit your dentist for a routine exam and preventive care every 6 months. Brush your teeth twice a day and floss once a day. Good oral hygiene prevents tooth decay and gum disease.  . The frequency of eye exams is based on your age, health, family medical history, use  of contact lenses, and other factors. Follow your health care provider's ecommendations for frequency of eye exams.  . Eat a healthy diet. Foods like vegetables, fruits, whole grains, low-fat dairy products, and lean protein foods contain the nutrients you need without too many calories. Decrease your intake of foods high in solid fats, added sugars, and salt. Eat the right amount of calories for you. Get information about a proper diet from your health care provider, if necessary.  . Regular physical exercise is one of the most important things you can do for your health. Most adults should get at least 150 minutes of moderate-intensity exercise (any  activity that increases your heart rate and causes you to sweat) each week. In addition, most adults need muscle-strengthening exercises on 2 or more days a week.  Silver Sneakers may be a benefit available to you. To determine eligibility, you may visit the website: www.silversneakers.com or contact program at 321 855 2084 Mon-Fri between 8AM-8PM.   . Maintain a healthy weight. The body mass index (BMI) is a screening tool to identify possible weight problems. It provides an estimate of body fat based on height and weight. Your health care provider can find your BMI and can help you achieve or maintain a healthy weight.   For adults 20 years and older: ? A BMI below 18.5 is considered underweight. ? A BMI of 18.5 to 24.9 is normal. ? A BMI of 25 to 29.9 is considered overweight. ? A BMI of 30 and above is considered obese.   . Maintain normal blood lipids and cholesterol levels by exercising and minimizing your intake of saturated fat. Eat a balanced diet with plenty of fruit and vegetables. Blood tests for lipids and cholesterol should begin at age 18 and be repeated every 5 years. If your lipid or cholesterol levels are high, you are over 50, or you are at high risk for heart disease, you may need your cholesterol levels checked more frequently. Ongoing high lipid and cholesterol levels should be treated with medicines if diet and exercise are not working.  . If you smoke, find out from your health care  provider how to quit. If you do not use tobacco, please do not start.  . If you choose to drink alcohol, please do not consume more than 2 drinks per day. One drink is considered to be 12 ounces (355 mL) of beer, 5 ounces (148 mL) of wine, or 1.5 ounces (44 mL) of liquor.  . If you are 80-13 years old, ask your health care provider if you should take aspirin to prevent strokes.  . Use sunscreen. Apply sunscreen liberally and repeatedly throughout the day. You should seek shade when your  shadow is shorter than you. Protect yourself by wearing long sleeves, pants, a wide-brimmed hat, and sunglasses year round, whenever you are outdoors.  . Once a month, do a whole body skin exam, using a mirror to look at the skin on your back. Tell your health care provider of new moles, moles that have irregular borders, moles that are larger than a pencil eraser, or moles that have changed in shape or color.

## 2016-01-22 NOTE — Progress Notes (Signed)
Subjective:   [name] is a 82 y.o. female who presents for Medicare Annual (Subsequent) preventive examination.   Cardiac Risk Factors include: advanced age (>12men, >61 women);dyslipidemia;hypertension     Objective:     Vitals: BP 122/82 mmHg  Pulse 71  Temp(Src) 97.7 F (36.5 C) (Oral)  Ht 5\' 4"  (1.626 m)  Wt 109 lb 8 oz (49.669 kg)  BMI 18.79 kg/m2  SpO2 96%  Body mass index is 18.79 kg/(m^2).   Tobacco History  Smoking status  . Never Smoker   Smokeless tobacco  . Never Used     Counseling given: No   Past Medical History  Diagnosis Date  . Allergic rhinitis, cause unspecified   . Other acute reactions to stress   . Diarrhea   . Internal hemorrhoids without mention of complication   . Other and unspecified hyperlipidemia   . Unspecified essential hypertension   . Pain in limb   . Osteoarthrosis, unspecified whether generalized or localized, unspecified site   . Osteoporosis, unspecified   . Other screening mammogram   . Edema   . Malignant neoplasm of rectum ([place])   . Special screening for osteoporosis   . Ulceration of vulva, unspecified   . History of colon cancer     s/p surgery, chemotherapy   Past Surgical History  Procedure Laterality Date  . Dental implants    . Partial colectomy  9/10    temporary colostomy   Family History  Problem Relation Age of Onset  . Stroke Mother   . Arthritis Mother   . Arthritis Brother   . Depression Sister    History  Sexual Activity  . Sexual Activity: Yes    Outpatient Encounter Prescriptions as of 01/22/2016  Medication Sig  . ALPRAZolam (XANAX) 0.5 MG tablet Take 0.5 tablets (0.25 mg total) by mouth 2 (two) times daily as needed. For anxiety  . colestipol (COLESTID) 1 G tablet Take 2 g by mouth 4 (four) times daily.  Marland Kitchen diltiazem (CARDIZEM CD) 240 MG 24 hr capsule Take 1 capsule (240 mg total) by mouth daily.  Marland Kitchen glucosamine-chondroitin 500-400 MG tablet Take 2 tablets by mouth daily.   .  hydrochlorothiazide (HYDRODIURIL) 25 MG tablet TAKE ONE TABLET BY MOUTH EVERY DAY  . paregoric 2 MG/5ML solution Take 5 mLs by mouth as needed for diarrhea or loose stools.  . [DISCONTINUED] diphenoxylate-atropine (LOMOTIL) 2.5-0.025 MG per tablet Take 1 tablet by mouth 4 (four) times daily as needed.  . [DISCONTINUED] Vitamin D, Ergocalciferol, (DRISDOL) 50000 UNITS CAPS capsule TAKE 1 CAPSULE (50,000 UNITS   TOTAL) BY MOUTH ONCE A WEEK.   No facility-administered encounter medications on file as of 01/22/2016.    Activities of Daily Living In your present state of health, do you have any difficulty performing the following activities: 01/22/2016  Hearing? N  Vision? N  Difficulty concentrating or making decisions? N  Walking or climbing stairs? N  Dressing or bathing? N  Doing errands, shopping? N  Preparing Food and eating ? N  Using the Toilet? N  In the past six months, have you accidently leaked urine? N  Do you have problems with loss of bowel control? Y  Managing your Medications? N  Managing your Finances? N  Housekeeping or managing your Housekeeping? N    Patient Care Team: Jinny Sanders, MD as PCP - [place] as Consulting Physician (Gastroenterology)    Assessment:     Exercise Activities and Dietary recommendations  Current Exercise Habits: Home exercise routine, Type of exercise: walking, Time (Minutes): 30, Frequency (Times/Week): 5, Weekly Exercise (Minutes/Week): 150, Intensity: Mild, Exercise limited by: None identified  Fall Risk Fall Risk  01/22/2016 01/24/2015 12/14/2013 12/12/2012  Falls in the past year? No No No No   Depression Screen PHQ 2/9 Scores 01/22/2016 01/24/2015 12/14/2013 12/12/2012  PHQ - 2 Score 0 0 0 0     Cognitive Testing MMSE - Mini Mental State Exam 01/22/2016  Orientation to time 5  Orientation to Place 5  Registration 3  Attention/ Calculation 0  Recall 3  Language- name 2 objects 0  Language- repeat 1  Language- follow 3  step command 3  Language- read & follow direction 0  Write a sentence 0  Copy design 0  Total score 20   PLEASE NOTE: A Mini-Cog screen was completed. Maximum score is 20. A value of 0 denotes this part of [place] MMSE was not completed.  Orientation to Time - Max 5 Orientation to Place - Max 5 Registration - Max 3 Recall - Max 3 Language Repeat - Max 1 Language Follow 3 Step Command - Max 3  Immunization History  Administered Date(s) Administered  . Influenza Whole 09/25/2009  . Pneumococcal Conjugate-13 12/14/2013  . Pneumococcal Polysaccharide-23 10/25/2004  . Td 10/25/2004   Screening Tests Health Maintenance  Topic Date Due  . INFLUENZA VACCINE  05/25/2016 (Originally 05/26/2015)  . TETANUS/TDAP  12/23/2016 (Originally 10/25/2014)  . ZOSTAVAX  01/21/2017 (Originally 04/04/1995)  . DEXA SCAN  12/24/2023 (Originally 04/03/2000)  . PNA vac Low Risk Adult  Completed      Plan:      I have personally reviewed the Medicare Annual Wellness questionnaire and have noted the following in the patient's chart:  A. Medical and social history B. Use of alcohol, tobacco or illicit drugs  C. Current medications and supplements D. Functional ability and status E.  Nutritional status F.  Physical activity G. Advance directives H. List of other physicians I.  Hospitalizations, surgeries, and ER visits in previous 12 months  J.  Vitals  K. Screenings  1. Hearing   2. Vision  3. Cognitive  4. Depression  L.  Reviewed preventative protocols and updated. A written personalized care plan   for preventive services was provided to patient.  M. Referrals   See scanned questionnaire as needed for further documentation.  Signed,   Lindell Noe, MHA, BS, LPN Health Advisor [phone]

## 2016-01-25 NOTE — Progress Notes (Signed)
I reviewed health advisor's note, was available for consultation, and agree with documentation and plan.  

## 2016-01-27 ENCOUNTER — Ambulatory Visit (INDEPENDENT_AMBULATORY_CARE_PROVIDER_SITE_OTHER): Payer: Medicare Other | Admitting: Family Medicine

## 2016-01-27 ENCOUNTER — Encounter: Payer: Self-pay | Admitting: Family Medicine

## 2016-01-27 VITALS — BP 114/64 | HR 80 | Temp 98.3°F | Ht 64.0 in | Wt 106.8 lb

## 2016-01-27 DIAGNOSIS — E785 Hyperlipidemia, unspecified: Secondary | ICD-10-CM

## 2016-01-27 DIAGNOSIS — I10 Essential (primary) hypertension: Secondary | ICD-10-CM | POA: Diagnosis not present

## 2016-01-27 DIAGNOSIS — K529 Noninfective gastroenteritis and colitis, unspecified: Secondary | ICD-10-CM

## 2016-01-27 DIAGNOSIS — E559 Vitamin D deficiency, unspecified: Secondary | ICD-10-CM

## 2016-01-27 DIAGNOSIS — E876 Hypokalemia: Secondary | ICD-10-CM | POA: Diagnosis not present

## 2016-01-27 DIAGNOSIS — M810 Age-related osteoporosis without current pathological fracture: Secondary | ICD-10-CM

## 2016-01-27 DIAGNOSIS — C20 Malignant neoplasm of rectum: Secondary | ICD-10-CM

## 2016-01-27 DIAGNOSIS — R634 Abnormal weight loss: Secondary | ICD-10-CM | POA: Insufficient documentation

## 2016-01-27 MED ORDER — MIRTAZAPINE 15 MG PO TABS: 15.0000 mg | ORAL_TABLET | Freq: Every day | ORAL | Status: DC

## 2016-01-27 NOTE — Progress Notes (Signed)
Subjective:    Patient ID: [name], female    DOB: [birth year], 82 y.o.   MRN: [id number]  HPI   Reviewed medical annual wellness performed by Lindell Noe, MHA, BS, LPN Health Advisor [phone]  Pt presents for annual visit.  Hypertension: Well controlled on HCTZ using as needed.  BP Readings from Last 3 Encounters:  01/27/16 114/64  01/22/16 122/82  01/24/15 128/64  Using medication without problems or lightheadedness: None  Chest pain with exertion:None  Edema:occ Short of breath:None  Average home BPs: Well controlled at home Other issues:   She has great energy overall.  Hypokalemia, likely from chronic diarrhea and HCTZ ( only uses prn) does have banna each day.  Elevated Cholesterol: Well controlled, using colestipol for diarreha only Lab Results  Component Value Date   CHOL 164 01/22/2016   HDL 92.90 01/22/2016   LDLCALC 55 01/22/2016   LDLDIRECT 114.6 11/16/2006   TRIG 82.0 01/22/2016   CHOLHDL 2 01/22/2016  Diet compliance: Good Exercise: walkingdaily, yard work Other complaints:  No longer followed by oncologist at [facility] as her MD left. Not interested in getting a new oncologist at this time. Followed by GI as below. 7 years out from rectal cancer.   She is seeing GI in [facility]... 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 Using lomotil, colestipol, and on paregoric.  She is now on CB1 to stimulate appetite. She feels she has a good appetite, just does not absorb food. She does limit things, not snack unless when her meds are some because she is worried about diarrhea   Body mass index is 18.31 kg/(m^2). Wt Readings from Last 3 Encounters:  01/27/16 106 lb 12 oz (48.421 kg)  01/22/16 109 lb 8 oz (49.669 kg)  01/24/15 105 lb 6.4 oz (47.809 kg)   Osteoporosis:  Refuses DEXA given she is not interested in medication to treat. Vit D low, taking supplement but out now, will refill.                 Review of Systems  Constitutional: Negative for fever and fatigue.  HENT: Negative for ear pain.   Eyes: Negative for pain.  Respiratory: Negative for chest tightness and shortness of breath.   Cardiovascular: Negative for chest pain, palpitations and leg swelling.  Gastrointestinal: Positive for diarrhea. Negative for abdominal pain.  Genitourinary: Negative for dysuria.       Objective:   Physical Exam  Constitutional: Vital signs are normal. She appears well-developed. She appears cachectic. She is cooperative.  Non-toxic appearance. She does not appear ill. No distress.  HENT:  Head: Normocephalic.  Right Ear: Hearing, tympanic membrane, external ear and ear canal normal.  Left Ear: Hearing, tympanic membrane, external ear and ear canal normal.  Nose: Nose normal.  Eyes: Conjunctivae, EOM and lids are normal. Pupils are equal, round, and reactive to light. Lids are everted and swept, no foreign bodies found.  Neck: Trachea normal and normal range of motion. Neck supple. Carotid bruit is not present. No thyroid mass and no thyromegaly present.  Cardiovascular: Normal rate, regular rhythm, S1 normal, S2 normal, normal heart sounds and intact distal pulses.  Exam reveals no gallop.   No murmur heard. Pulmonary/Chest: Effort normal and breath sounds normal. No respiratory distress. She has no wheezes. She has no rhonchi. She has no rales.  Abdominal: Soft. Normal appearance and bowel sounds are normal. She exhibits no distension, no fluid wave, no abdominal bruit and no mass.  There is no hepatosplenomegaly. There is no tenderness. There is no rebound, no guarding and no CVA tenderness. No hernia.  Lymphadenopathy:    She has no cervical adenopathy.    She has no axillary adenopathy.  Neurological: She is alert. She has normal strength. No cranial nerve deficit or sensory deficit.  Skin: Skin is warm, dry and intact. No rash noted.  Psychiatric: Her speech is normal and behavior  is normal. Judgment normal. Her mood appears not anxious. Cognition and memory are normal. She does not exhibit a depressed mood.          Assessment & Plan:

## 2016-01-27 NOTE — Assessment & Plan Note (Signed)
Refuses DEXA to follow, refused treatment other than weight bearing exercise and vit D,Ca

## 2016-01-27 NOTE — Assessment & Plan Note (Signed)
Due to chronic diarrhea and poor absorption.  Start remeron to see if increasing portion size and appetitte help with weight gain.

## 2016-01-27 NOTE — Assessment & Plan Note (Addendum)
Due to mistaken removal of ileocecal valve.  Stable on current meds, followed by GI.

## 2016-01-27 NOTE — Assessment & Plan Note (Signed)
Excellent control on no med. 

## 2016-01-27 NOTE — Assessment & Plan Note (Signed)
Well controlled. Continue current medication.  

## 2016-01-27 NOTE — Assessment & Plan Note (Signed)
Likely due to chronic diarrhea, uses HCTZ only prn. Increase K in diet, pt refuses potassium supplement.

## 2016-01-27 NOTE — Assessment & Plan Note (Signed)
Followed by GI

## 2016-01-27 NOTE — Assessment & Plan Note (Signed)
Improved, continue supplement 

## 2016-01-27 NOTE — Patient Instructions (Addendum)
Increase potassium in diet.  Start remeron for appetite stimulant at bedtime.  Follow up in 2-3 months for weight check.   Hypokalemia Hypokalemia means that the amount of potassium in the blood is lower than normal.Potassium is a chemical, called an electrolyte, that helps regulate the amount of fluid in the body. It also stimulates muscle contraction and helps nerves function properly.Most of the body's potassium is inside of cells, and only a very small amount is in the blood. Because the amount in the blood is so small, minor changes can be life-threatening. CAUSES  Antibiotics.  Diarrhea or vomiting.  Using laxatives too much, which can cause diarrhea.  Chronic kidney disease.  Water pills (diuretics).  Eating disorders (bulimia).  Low magnesium level.  Sweating a lot. SIGNS AND SYMPTOMS  Weakness.  Constipation.  Fatigue.  Muscle cramps.  Mental confusion.  Skipped heartbeats or irregular heartbeat (palpitations).  Tingling or numbness. DIAGNOSIS  Your health care provider can diagnose hypokalemia with blood tests. In addition to checking your potassium level, your health care provider may also check other lab tests. TREATMENT Hypokalemia can be treated with potassium supplements taken by mouth or adjustments in your current medicines. If your potassium level is very low, you may need to get potassium through a vein (IV) and be monitored in the hospital. A diet high in potassium is also helpful. Foods high in potassium are:  Nuts, such as peanuts and pistachios.  Seeds, such as sunflower seeds and pumpkin seeds.  Peas, lentils, and lima beans.  Whole grain and bran cereals and breads.  Fresh fruit and vegetables, such as apricots, avocado, bananas, cantaloupe, kiwi, oranges, tomatoes, asparagus, and potatoes.  Orange and tomato juices.  Red meats.  Fruit yogurt. HOME CARE INSTRUCTIONS  Take all medicines as prescribed by your health care  provider.  Maintain a healthy diet by including nutritious food, such as fruits, vegetables, nuts, whole grains, and lean meats.  If you are taking a laxative, be sure to follow the directions on the label. SEEK MEDICAL CARE IF:  Your weakness gets worse.  You feel your heart pounding or racing.  You are vomiting or having diarrhea.  You are diabetic and having trouble keeping your blood glucose in the normal range. SEEK IMMEDIATE MEDICAL CARE IF:  You have chest pain, shortness of breath, or dizziness.  You are vomiting or having diarrhea for more than 2 days.  You faint. MAKE SURE YOU:   Understand these instructions.  Will watch your condition.  Will get help right away if you are not doing well or get worse.   This information is not intended to replace advice given to you by your health care provider. Make sure you discuss any questions you have with your health care provider.   Document Released: 10/11/2005 Document Revised: 11/01/2014 Document Reviewed: 04/13/2013 Elsevier Interactive Patient Education [employer].

## 2016-01-27 NOTE — Progress Notes (Signed)
Pre visit review using our clinic review tool, if applicable. No additional management support is needed unless otherwise documented below in the visit note. 

## 2016-03-01 DIAGNOSIS — L821 Other seborrheic keratosis: Secondary | ICD-10-CM | POA: Diagnosis not present

## 2016-03-01 DIAGNOSIS — L570 Actinic keratosis: Secondary | ICD-10-CM | POA: Diagnosis not present

## 2016-03-01 DIAGNOSIS — L814 Other melanin hyperpigmentation: Secondary | ICD-10-CM | POA: Diagnosis not present

## 2016-03-17 DIAGNOSIS — H2513 Age-related nuclear cataract, bilateral: Secondary | ICD-10-CM | POA: Diagnosis not present

## 2016-04-19 DIAGNOSIS — T63461A Toxic effect of venom of wasps, accidental (unintentional), initial encounter: Secondary | ICD-10-CM | POA: Diagnosis not present

## 2016-04-26 ENCOUNTER — Other Ambulatory Visit: Payer: Self-pay | Admitting: Family Medicine

## 2016-04-30 ENCOUNTER — Ambulatory Visit (INDEPENDENT_AMBULATORY_CARE_PROVIDER_SITE_OTHER): Payer: Medicare Other | Admitting: Family Medicine

## 2016-04-30 ENCOUNTER — Encounter: Payer: Self-pay | Admitting: Family Medicine

## 2016-04-30 VITALS — BP 120/74 | HR 75 | Temp 97.9°F | Ht 64.0 in | Wt 105.5 lb

## 2016-04-30 DIAGNOSIS — K529 Noninfective gastroenteritis and colitis, unspecified: Secondary | ICD-10-CM | POA: Diagnosis not present

## 2016-04-30 DIAGNOSIS — I10 Essential (primary) hypertension: Secondary | ICD-10-CM

## 2016-04-30 DIAGNOSIS — R634 Abnormal weight loss: Secondary | ICD-10-CM

## 2016-04-30 NOTE — Progress Notes (Signed)
   Subjective:    Patient ID: [name], female    DOB: 12-Mar-1933, 82 y.o.   MRN: [id number]  HPI  82 year old female with chronic diarrhea due to past surgery for rectal cancer presents for  3 month weight check.  She is currently on mirtazapine 15 mg daily. She has not taken it regularly. She did not take it regularly given stomach was upset. She is going to try it over the next month now that she is feeling better.   Wt Readings from Last 3 Encounters:  04/30/16 105 lb 8 oz (47.854 kg)  01/27/16 106 lb 12 oz (48.421 kg)  01/22/16 109 lb 8 oz (49.669 kg)   Social History /Family History/Past Medical History reviewed and updated if needed.  Body mass index is 18.1 kg/(m^2).  BP Readings from Last 3 Encounters:  04/30/16 120/74  01/27/16 114/64  01/22/16 122/82     Review of Systems  Constitutional: Negative for fever and fatigue.  HENT: Negative for ear pain.   Eyes: Negative for pain.  Respiratory: Negative for chest tightness and shortness of breath.   Cardiovascular: Negative for chest pain, palpitations and leg swelling.  Gastrointestinal: Negative for abdominal pain.  Genitourinary: Negative for dysuria.       Objective:   Physical Exam  Constitutional: Vital signs are normal. She appears well-developed. She appears cachectic. She is cooperative.  Non-toxic appearance. She does not appear ill. No distress.  HENT:  Head: Normocephalic.  Right Ear: Hearing, tympanic membrane, external ear and ear canal normal.  Left Ear: Hearing, tympanic membrane, external ear and ear canal normal.  Nose: Nose normal.  Eyes: Conjunctivae, EOM and lids are normal. Pupils are equal, round, and reactive to light. Lids are everted and swept, no foreign bodies found.  Neck: Trachea normal and normal range of motion. Neck supple. Carotid bruit is not present. No thyroid mass and no thyromegaly present.  Cardiovascular: Normal rate, regular rhythm, S1 normal, S2 normal, normal heart  sounds and intact distal pulses.  Exam reveals no gallop.   No murmur heard. Pulmonary/Chest: Effort normal and breath sounds normal. No respiratory distress. She has no wheezes. She has no rhonchi. She has no rales.  Abdominal: Soft. Normal appearance and bowel sounds are normal. She exhibits no distension, no fluid wave, no abdominal bruit and no mass. There is no hepatosplenomegaly. There is no tenderness. There is no rebound, no guarding and no CVA tenderness. No hernia.  Lymphadenopathy:    She has no cervical adenopathy.    She has no axillary adenopathy.  Neurological: She is alert. She has normal strength. No cranial nerve deficit or sensory deficit.  Skin: Skin is warm, dry and intact. No rash noted.  Psychiatric: Her speech is normal and behavior is normal. Judgment normal. Her mood appears not anxious. Cognition and memory are normal. She does not exhibit a depressed mood.          Assessment & Plan:

## 2016-04-30 NOTE — Progress Notes (Signed)
Pre visit review using our clinic review tool, if applicable. No additional management support is needed unless otherwise documented below in the visit note. 

## 2016-04-30 NOTE — Assessment & Plan Note (Signed)
Stable but significant.

## 2016-04-30 NOTE — Assessment & Plan Note (Signed)
Well controlled. Continue current medication.  

## 2016-04-30 NOTE — Assessment & Plan Note (Signed)
Retry mirtazapine. Increase to 30 if not working after 1 month.

## 2016-04-30 NOTE — Patient Instructions (Signed)
Restart mirtazapine nightly for 1 month.  Let me know if improvement in appetite and weight.  If not consider increasing to 30 mg daily.

## 2016-05-22 DIAGNOSIS — T63441A Toxic effect of venom of bees, accidental (unintentional), initial encounter: Secondary | ICD-10-CM | POA: Diagnosis not present

## 2016-06-29 ENCOUNTER — Other Ambulatory Visit: Payer: Self-pay | Admitting: Family Medicine

## 2016-07-23 ENCOUNTER — Encounter: Payer: Self-pay | Admitting: Family Medicine

## 2016-07-23 ENCOUNTER — Encounter: Payer: Self-pay | Admitting: *Deleted

## 2016-07-23 ENCOUNTER — Ambulatory Visit (INDEPENDENT_AMBULATORY_CARE_PROVIDER_SITE_OTHER): Payer: Medicare Other | Admitting: Family Medicine

## 2016-07-23 VITALS — BP 160/102 | HR 107 | Temp 97.9°F | Ht 64.0 in | Wt 107.0 lb

## 2016-07-23 DIAGNOSIS — M542 Cervicalgia: Secondary | ICD-10-CM | POA: Insufficient documentation

## 2016-07-23 DIAGNOSIS — I10 Essential (primary) hypertension: Secondary | ICD-10-CM | POA: Diagnosis not present

## 2016-07-23 DIAGNOSIS — R609 Edema, unspecified: Secondary | ICD-10-CM

## 2016-07-23 LAB — CBC WITH DIFFERENTIAL/PLATELET
Basophils Absolute: 0 10*3/uL (ref 0.0–0.1)
Basophils Relative: 0.6 % (ref 0.0–3.0)
EOS PCT: 1.9 % (ref 0.0–5.0)
Eosinophils Absolute: 0.1 10*3/uL (ref 0.0–0.7)
HCT: 45 % (ref 36.0–46.0)
Hemoglobin: 15.3 g/dL — ABNORMAL HIGH (ref 12.0–15.0)
LYMPHS ABS: 1.4 10*3/uL (ref 0.7–4.0)
Lymphocytes Relative: 23 % (ref 12.0–46.0)
MCHC: 34 g/dL (ref 30.0–36.0)
MCV: 91.5 fl (ref 78.0–100.0)
MONOS PCT: 8.4 % (ref 3.0–12.0)
Monocytes Absolute: 0.5 10*3/uL (ref 0.1–1.0)
NEUTROS ABS: 4 10*3/uL (ref 1.4–7.7)
NEUTROS PCT: 66.1 % (ref 43.0–77.0)
PLATELETS: 239 10*3/uL (ref 150.0–400.0)
RBC: 4.91 Mil/uL (ref 3.87–5.11)
RDW: 14.1 % (ref 11.5–15.5)
WBC: 6 10*3/uL (ref 4.0–10.5)

## 2016-07-23 LAB — COMPREHENSIVE METABOLIC PANEL
ALK PHOS: 65 U/L (ref 39–117)
ALT: 14 U/L (ref 0–35)
AST: 16 U/L (ref 0–37)
Albumin: 3.8 g/dL (ref 3.5–5.2)
BUN: 16 mg/dL (ref 6–23)
CO2: 34 meq/L — AB (ref 19–32)
Calcium: 9.7 mg/dL (ref 8.4–10.5)
Chloride: 102 mEq/L (ref 96–112)
Creatinine, Ser: 0.63 mg/dL (ref 0.40–1.20)
GFR: 96.32 mL/min (ref >60.00)
GLUCOSE: 90 mg/dL (ref 70–99)
POTASSIUM: 3.8 meq/L (ref 3.5–5.1)
Sodium: 140 mEq/L (ref 135–145)
Total Bilirubin: 0.8 mg/dL (ref 0.2–1.2)
Total Protein: 6.6 g/dL (ref 6.0–8.3)

## 2016-07-23 LAB — T4, FREE: Free T4: 1.02 ng/dL (ref 0.60–1.60)

## 2016-07-23 LAB — TSH: TSH: 0.56 u[IU]/mL (ref 0.35–4.50)

## 2016-07-23 LAB — T3, FREE: T3, Free: 3 pg/mL (ref 2.3–4.2)

## 2016-07-23 NOTE — Patient Instructions (Signed)
Start heat, gentle stretching of left neck.,  Ibuprofen as need for pain and swelling.  Stop at lab on way out .   Follow BP at home.. Goal < 140/90.  Call if BP remaining high. Continue current medication.

## 2016-07-23 NOTE — Progress Notes (Signed)
Pre visit review using our clinic review tool, if applicable. No additional management support is needed unless otherwise documented below in the visit note. 

## 2016-07-23 NOTE — Assessment & Plan Note (Signed)
BP increase likely due to stress.  Eval for other secondary causes.  Check at home.. If persistently > 140/90 will need to adjust medication.

## 2016-07-23 NOTE — Progress Notes (Signed)
Subjective:    Patient ID: [name], female    DOB: Jul 07, 1933, 82 y.o.   MRN: [id number]  HPI   82 year old female presents with new onset  Swelling in left side of neck.  She noted area in last week, mildly sore to touch, neck feels stiff. No redness, no skin changes. Decreased energy.  She has had recent visitors of family.. She pushed herself to hard. Cooked a lot. Was on her feet. No increase in alcohol, no increase in salt.  Had been noting swelling in feet, ankles.  Decreased at night, returns after being on her feet a lot.  Rested yesterday and no swelling.  Typically has not had swelling in past.   She has not been checking at home. She is taking HCTZ and diltiazem 240 daily. BP Readings from Last 3 Encounters:  07/23/16 (!) 160/102  04/30/16 120/74  01/27/16 114/64    She has taken ibuprofen a few times.Marland Kitchen Helps area.    She has a history of rectal cancer, S/P resection.    Review of Systems  Constitutional: Negative for fatigue and fever.  HENT: Negative for ear pain.   Eyes: Negative for pain.  Respiratory: Negative for chest tightness and shortness of breath.   Cardiovascular: Negative for chest pain, palpitations and leg swelling.  Gastrointestinal: Negative for abdominal pain.  Genitourinary: Negative for dysuria.       Objective:   Physical Exam  Constitutional: Vital signs are normal. She appears well-developed and well-nourished. She is cooperative.  Non-toxic appearance. She does not appear ill. No distress.  HENT:  Head: Normocephalic.  Right Ear: Hearing, tympanic membrane, external ear and ear canal normal. Tympanic membrane is not erythematous, not retracted and not bulging.  Left Ear: Hearing, tympanic membrane, external ear and ear canal normal. Tympanic membrane is not erythematous, not retracted and not bulging.  Nose: No mucosal edema or rhinorrhea. Right sinus exhibits no maxillary sinus tenderness and no frontal sinus tenderness.  Left sinus exhibits no maxillary sinus tenderness and no frontal sinus tenderness.  Mouth/Throat: Uvula is midline, oropharynx is clear and moist and mucous membranes are normal.  Eyes: Conjunctivae, EOM and lids are normal. Pupils are equal, round, and reactive to light. Lids are everted and swept, no foreign bodies found.  Neck: Trachea normal and normal range of motion. Neck supple. Carotid bruit is not present. No thyroid mass and no thyromegaly present.  ttp over belly of sternocleidomastoid muscle, no palpable mass, no lymphadenopathy.  Cardiovascular: Normal rate, regular rhythm, S1 normal, S2 normal, normal heart sounds, intact distal pulses and normal pulses.  Exam reveals no gallop and no friction rub.   No murmur heard. Pulmonary/Chest: Effort normal and breath sounds normal. No tachypnea. No respiratory distress. She has no decreased breath sounds. She has no wheezes. She has no rhonchi. She has no rales.  Abdominal: Soft. Normal appearance and bowel sounds are normal. There is no tenderness.  Lymphadenopathy:       Head (right side): No submental, no submandibular, no tonsillar, no preauricular, no posterior auricular and no occipital adenopathy present.       Head (left side): No submental, no submandibular, no tonsillar, no preauricular, no posterior auricular and no occipital adenopathy present.    She has no cervical adenopathy.    She has no axillary adenopathy.       Right: No inguinal, no supraclavicular and no epitrochlear adenopathy present.       Left: No inguinal, no  supraclavicular and no epitrochlear adenopathy present.  Neurological: She is alert.  Skin: Skin is warm, dry and intact. No rash noted.  Psychiatric: Her speech is normal and behavior is normal. Judgment and thought content normal. Her mood appears not anxious. Cognition and memory are normal. She does not exhibit a depressed mood.          Assessment & Plan:

## 2016-07-23 NOTE — Assessment & Plan Note (Signed)
No mass noted. Most likely strain of SCM on left neck. Start NSAID prn paina nd swelling, start heat and gentle ROM exercises.

## 2016-07-23 NOTE — Assessment & Plan Note (Addendum)
Likely due to increase in BP as well as recent time spent on feet in setting of mild varicose veins.  Eval with labs for pother causes.

## 2016-07-29 ENCOUNTER — Ambulatory Visit (INDEPENDENT_AMBULATORY_CARE_PROVIDER_SITE_OTHER): Payer: Medicare Other | Admitting: Family Medicine

## 2016-07-29 VITALS — BP 144/93 | HR 93 | Temp 98.4°F | Ht 64.0 in | Wt 109.8 lb

## 2016-07-29 DIAGNOSIS — R9431 Abnormal electrocardiogram [ECG] [EKG]: Secondary | ICD-10-CM

## 2016-07-29 DIAGNOSIS — I10 Essential (primary) hypertension: Secondary | ICD-10-CM

## 2016-07-29 DIAGNOSIS — R609 Edema, unspecified: Secondary | ICD-10-CM | POA: Diagnosis not present

## 2016-07-29 MED ORDER — CHLORTHALIDONE 25 MG PO TABS: 25.0000 mg | ORAL_TABLET | Freq: Every day | ORAL | 11 refills | Status: DC

## 2016-07-29 NOTE — Assessment & Plan Note (Signed)
?   Cause.  Will eval heart with echo given elevated BPs and abn EKG.. Possible diastolic heart failure.

## 2016-07-29 NOTE — Patient Instructions (Signed)
Stop HCTZ. Change to chlorthalidine Stop at front desk to set up referral for ECHO.

## 2016-07-29 NOTE — Progress Notes (Signed)
   Subjective:    Patient ID: [name], female    DOB: [birth year], 82 y.o.   MRN: [id number]  HPI  82 year old female presents with contiued preipheral swelling and BP elevation.   At last OV on 9/29:  Thyroid, cbc and CMET nml.   On HCTZ and dilitazem for BP.  BP at home 135-164/70-90s  BP Readings from Last 3 Encounters:  07/29/16 (!) 144/93  07/23/16 (!) 160/102  04/30/16 120/74   No chest pain, no shortness of breath, no fatigue. She has noted occ soreness in low back, R>L, no radiation of pain.  Still with mild neck stiffness.  Review of Systems  Constitutional: Negative for fatigue and fever.  HENT: Negative for ear pain.   Eyes: Negative for pain.  Respiratory: Negative for chest tightness and shortness of breath.   Cardiovascular: Negative for chest pain, palpitations and leg swelling.  Gastrointestinal: Negative for abdominal pain.  Genitourinary: Negative for dysuria.       Objective:   Physical Exam  Constitutional: Vital signs are normal. She appears well-developed and well-nourished. She is cooperative.  Non-toxic appearance. She does not appear ill. No distress.  HENT:  Head: Normocephalic.  Right Ear: Hearing, tympanic membrane, external ear and ear canal normal. Tympanic membrane is not erythematous, not retracted and not bulging.  Left Ear: Hearing, tympanic membrane, external ear and ear canal normal. Tympanic membrane is not erythematous, not retracted and not bulging.  Nose: No mucosal edema or rhinorrhea. Right sinus exhibits no maxillary sinus tenderness and no frontal sinus tenderness. Left sinus exhibits no maxillary sinus tenderness and no frontal sinus tenderness.  Mouth/Throat: Uvula is midline, oropharynx is clear and moist and mucous membranes are normal.  Eyes: Conjunctivae, EOM and lids are normal. Pupils are equal, round, and reactive to light. Lids are everted and swept, no foreign bodies found.  Neck: Trachea normal and normal range of  motion. Neck supple. Carotid bruit is not present. No thyroid mass and no thyromegaly present.  Cardiovascular: Normal rate, regular rhythm, S1 normal, S2 normal, normal heart sounds, intact distal pulses and normal pulses.  Exam reveals no gallop and no friction rub.   No murmur heard.  2 plus nonpitting edema bilaterally.  Pulmonary/Chest: Effort normal and breath sounds normal. No tachypnea. No respiratory distress. She has no decreased breath sounds. She has no wheezes. She has no rhonchi. She has no rales.  Abdominal: Soft. Normal appearance and bowel sounds are normal. There is no tenderness.  Neurological: She is alert.  Skin: Skin is warm, dry and intact. No rash noted.  Psychiatric: Her speech is normal and behavior is normal. Judgment and thought content normal. Her mood appears not anxious. Cognition and memory are normal. She does not exhibit a depressed mood.          Assessment & Plan:

## 2016-07-29 NOTE — Progress Notes (Signed)
Pre visit review using our clinic review tool, if applicable. No additional management support is needed unless otherwise documented below in the visit note. 

## 2016-07-29 NOTE — Assessment & Plan Note (Signed)
Need better control. Change HCTZ to chlorthalidone 25 mg adily for better diuresis.

## 2016-07-30 ENCOUNTER — Telehealth: Payer: Self-pay | Admitting: Family Medicine

## 2016-07-30 NOTE — Telephone Encounter (Signed)
Opened in error

## 2016-08-02 ENCOUNTER — Other Ambulatory Visit: Payer: Medicare Other

## 2016-08-02 ENCOUNTER — Other Ambulatory Visit: Payer: Self-pay

## 2016-08-02 ENCOUNTER — Ambulatory Visit (INDEPENDENT_AMBULATORY_CARE_PROVIDER_SITE_OTHER): Payer: Medicare Other

## 2016-08-02 DIAGNOSIS — I10 Essential (primary) hypertension: Secondary | ICD-10-CM

## 2016-08-02 DIAGNOSIS — R9431 Abnormal electrocardiogram [ECG] [EKG]: Secondary | ICD-10-CM

## 2016-08-02 DIAGNOSIS — R609 Edema, unspecified: Secondary | ICD-10-CM | POA: Diagnosis not present

## 2016-08-03 ENCOUNTER — Telehealth: Payer: Self-pay | Admitting: Family Medicine

## 2016-08-03 NOTE — Telephone Encounter (Signed)
Echo Results given to patient by telephone.

## 2016-08-03 NOTE — Telephone Encounter (Signed)
Pt returned your call.  

## 2016-08-18 ENCOUNTER — Other Ambulatory Visit: Payer: Medicare Other

## 2016-12-20 DIAGNOSIS — L570 Actinic keratosis: Secondary | ICD-10-CM | POA: Diagnosis not present

## 2016-12-24 DIAGNOSIS — K529 Noninfective gastroenteritis and colitis, unspecified: Secondary | ICD-10-CM | POA: Diagnosis not present

## 2017-01-04 ENCOUNTER — Other Ambulatory Visit: Payer: Self-pay | Admitting: Family Medicine

## 2017-01-18 ENCOUNTER — Other Ambulatory Visit: Payer: Medicare Other

## 2017-01-18 ENCOUNTER — Ambulatory Visit (INDEPENDENT_AMBULATORY_CARE_PROVIDER_SITE_OTHER): Payer: Medicare Other

## 2017-01-18 VITALS — BP 110/78 | HR 78 | Temp 97.8°F | Ht 63.0 in | Wt 109.2 lb

## 2017-01-18 DIAGNOSIS — E559 Vitamin D deficiency, unspecified: Secondary | ICD-10-CM | POA: Diagnosis not present

## 2017-01-18 DIAGNOSIS — Z0000 Encounter for general adult medical examination without abnormal findings: Secondary | ICD-10-CM | POA: Diagnosis not present

## 2017-01-18 DIAGNOSIS — I10 Essential (primary) hypertension: Secondary | ICD-10-CM | POA: Diagnosis not present

## 2017-01-18 DIAGNOSIS — E784 Other hyperlipidemia: Secondary | ICD-10-CM | POA: Diagnosis not present

## 2017-01-18 DIAGNOSIS — E7849 Other hyperlipidemia: Secondary | ICD-10-CM

## 2017-01-18 DIAGNOSIS — D582 Other hemoglobinopathies: Secondary | ICD-10-CM

## 2017-01-18 LAB — CBC WITH DIFFERENTIAL/PLATELET
Basophils Absolute: 0.1 10*3/uL (ref 0.0–0.1)
Basophils Relative: 1 % (ref 0.0–3.0)
Eosinophils Absolute: 0.2 10*3/uL (ref 0.0–0.7)
Eosinophils Relative: 2.8 % (ref 0.0–5.0)
HCT: 44 % (ref 36.0–46.0)
Hemoglobin: 14.8 g/dL (ref 12.0–15.0)
Lymphocytes Relative: 32.8 % (ref 12.0–46.0)
Lymphs Abs: 1.9 10*3/uL (ref 0.7–4.0)
MCHC: 33.6 g/dL (ref 30.0–36.0)
MCV: 91.4 fl (ref 78.0–100.0)
Monocytes Absolute: 0.7 10*3/uL (ref 0.1–1.0)
Monocytes Relative: 12.3 % — ABNORMAL HIGH (ref 3.0–12.0)
Neutro Abs: 3 10*3/uL (ref 1.4–7.7)
Neutrophils Relative %: 51.1 % (ref 43.0–77.0)
Platelets: 267 10*3/uL (ref 150.0–400.0)
RBC: 4.82 Mil/uL (ref 3.87–5.11)
RDW: 14.4 % (ref 11.5–15.5)
WBC: 5.9 10*3/uL (ref 4.0–10.5)

## 2017-01-18 LAB — COMPREHENSIVE METABOLIC PANEL
ALBUMIN: 4.3 g/dL (ref 3.5–5.2)
ALK PHOS: 70 U/L (ref 39–117)
ALT: 13 U/L (ref 0–35)
AST: 15 U/L (ref 0–37)
BILIRUBIN TOTAL: 0.7 mg/dL (ref 0.2–1.2)
BUN: 20 mg/dL (ref 6–23)
CALCIUM: 10.4 mg/dL (ref 8.4–10.5)
CHLORIDE: 98 meq/L (ref 96–112)
CO2: 35 mEq/L — ABNORMAL HIGH (ref 19–32)
Creatinine, Ser: 0.77 mg/dL (ref 0.40–1.20)
GFR: 76.32 mL/min (ref >60.00)
Glucose, Bld: 94 mg/dL (ref 70–99)
POTASSIUM: 3.5 meq/L (ref 3.5–5.1)
SODIUM: 139 meq/L (ref 135–145)
Total Protein: 7.1 g/dL (ref 6.0–8.3)

## 2017-01-18 LAB — LIPID PANEL
CHOL/HDL RATIO: 2
Cholesterol: 174 mg/dL (ref 0–200)
HDL: 88.8 mg/dL (ref >39.00)
LDL CALC: 71 mg/dL (ref 0–99)
NONHDL: 85.49
TRIGLYCERIDES: 74 mg/dL (ref 0.0–149.0)
VLDL: 14.8 mg/dL (ref 0.0–40.0)

## 2017-01-18 LAB — VITAMIN D 25 HYDROXY (VIT D DEFICIENCY, FRACTURES): VITD: 30.69 ng/mL (ref 30.00–100.00)

## 2017-01-18 NOTE — Progress Notes (Signed)
I reviewed health advisor's note, was available for consultation, and agree with documentation and plan.  

## 2017-01-18 NOTE — Progress Notes (Signed)
Subjective:   [name] is a 83 y.o. female who presents for Medicare Annual (Subsequent) preventive examination.  Review of Systems:  N/A Cardiac Risk Factors include: advanced age (>66men, >39 women);dyslipidemia;hypertension     Objective:     Vitals: BP 110/78 (BP Location: Right Arm, Patient Position: Sitting, Cuff Size: Normal)   Pulse 78   Temp 97.8 F (36.6 C) (Oral)   Ht 5\' 3"  (1.6 m) Comment: no shoes  Wt 109 lb 4 oz (49.6 kg)   SpO2 97%   BMI 19.35 kg/m   Body mass index is 19.35 kg/m.   Tobacco History  Smoking Status  . Never Smoker  Smokeless Tobacco  . Never Used     Counseling given: No   Past Medical History:  Diagnosis Date  . Allergic rhinitis, cause unspecified   . Diarrhea   . Edema   . History of colon cancer    s/p surgery, chemotherapy  . Internal hemorrhoids without mention of complication   . Malignant neoplasm of rectum ([place])   . Osteoarthrosis, unspecified whether generalized or localized, unspecified site   . Osteoporosis, unspecified   . Other acute reactions to stress   . Other and unspecified hyperlipidemia   . Other screening mammogram   . Pain in limb   . Special screening for osteoporosis   . Ulceration of vulva, unspecified   . Unspecified essential hypertension    Past Surgical History:  Procedure Laterality Date  . dental implants    . PARTIAL COLECTOMY  9/10   temporary colostomy   Family History  Problem Relation Age of Onset  . Stroke Mother   . Arthritis Mother   . Arthritis Brother   . Depression Sister    History  Sexual Activity  . Sexual activity: Yes    Outpatient Encounter Prescriptions as of 01/18/2017  Medication Sig  . ALPRAZolam (XANAX) 0.5 MG tablet Take 0.5 tablets (0.25 mg total) by mouth 2 (two) times daily as needed. For anxiety  . chlorthalidone (HYGROTON) 25 MG tablet Take 1 tablet (25 mg total) by mouth daily.  . Cholecalciferol (VITAMIN D PO) Take 5,000 Units by mouth daily.    . colestipol (COLESTID) 1 G tablet Take 2 g by mouth 4 (four) times daily.  Marland Kitchen diltiazem (CARDIZEM CD) 240 MG 24 hr capsule Take 1 capsule (240 mg total) by mouth daily.  Marland Kitchen glucosamine-chondroitin 500-400 MG tablet Take 2 tablets by mouth daily.   . mirtazapine (REMERON) 15 MG tablet Take 1 tablet (15 mg total) by mouth at bedtime.  . paregoric 2 MG/5ML solution Take 5 mLs by mouth as needed for diarrhea or loose stools.   No facility-administered encounter medications on file as of 01/18/2017.     Activities of Daily Living In your present state of health, do you have any difficulty performing the following activities: 01/18/2017 01/22/2016  Hearing? N N  Vision? N N  Difficulty concentrating or making decisions? N N  Walking or climbing stairs? N N  Dressing or bathing? N N  Doing errands, shopping? N N  Preparing Food and eating ? N N  Using the Toilet? N N  In the past six months, have you accidently leaked urine? N N  Do you have problems with loss of bowel control? N Y  Managing your Medications? N N  Managing your Finances? N N  Housekeeping or managing your Housekeeping? N N  Some recent data might be hidden    Patient Care  Team: Jinny Sanders, MD as PCP - General Vinie Sill Mixon as Consulting Physician (Gastroenterology)    Assessment:     Hearing Screening   125Hz  250Hz  500Hz  1000Hz  2000Hz  3000Hz  4000Hz  6000Hz  8000Hz   Right ear:   40 0 0  0    Left ear:   40 0 0  0      Visual Acuity Screening   Right eye Left eye Both eyes  Without correction: 20/25-1 20/50 20/20  With correction:       Exercise Activities and Dietary recommendations Current Exercise Habits: Home exercise routine, Type of exercise: walking, Time (Minutes): 30, Frequency (Times/Week): 4, Weekly Exercise (Minutes/Week): 120, Intensity: Mild, Exercise limited by: None identified  Goals    . Increase physical activity          Starting 01/18/2017, I will continue to walk at least 30 min 3-4 days  per week.       Fall Risk Fall Risk  01/18/2017 01/22/2016 01/24/2015 12/14/2013 12/12/2012  Falls in the past year? No No No No No   Depression Screen PHQ 2/9 Scores 01/18/2017 01/22/2016 01/24/2015 12/14/2013  PHQ - 2 Score 0 0 0 0     Cognitive Function MMSE - Mini Mental State Exam 01/18/2017 01/22/2016  Orientation to time 5 5  Orientation to Place 5 5  Registration 3 3  Attention/ Calculation 0 0  Recall 3 3  Language- name 2 objects 0 0  Language- repeat 1 1  Language- follow 3 step command 3 3  Language- read & follow direction 0 0  Write a sentence 0 0  Copy design 0 0  Total score 20 20     PLEASE NOTE: A Mini-Cog screen was completed. Maximum score is 20. A value of 0 denotes this part of Folstein MMSE was not completed or the patient failed this part of the Mini-Cog screening.   Mini-Cog Screening Orientation to Time - Max 5 pts Orientation to Place - Max 5 pts Registration - Max 3 pts Recall - Max 3 pts Language Repeat - Max 1 pts Language Follow 3 Step Command - Max 3 pts     Immunization History  Administered Date(s) Administered  . Influenza Whole 09/25/2009  . Pneumococcal Conjugate-13 12/14/2013  . Pneumococcal Polysaccharide-23 10/25/2004  . Td 10/25/2004   Screening Tests Health Maintenance  Topic Date Due  . INFLUENZA VACCINE  01/22/2017 (Originally 05/25/2016)  . DEXA SCAN  12/24/2023 (Originally 04/03/2000)  . TETANUS/TDAP  10/24/2024 (Originally 10/25/2014)  . PNA vac Low Risk Adult  Completed      Plan:     I have personally reviewed and addressed the Medicare Annual Wellness questionnaire and have noted the following in the patient's chart:  A. Medical and social history B. Use of alcohol, tobacco or illicit drugs  C. Current medications and supplements D. Functional ability and status E.  Nutritional status F.  Physical activity G. Advance directives H. List of other physicians I.  Hospitalizations, surgeries, and ER visits in previous 12  months J.  [place] to include hearing, vision, cognitive, depression L. Referrals and appointments - none  In addition, I have reviewed and discussed with patient certain preventive protocols, quality metrics, and best practice recommendations. A written personalized care plan for preventive services as well as general preventive health recommendations were provided to patient.  See attached scanned questionnaire for additional information.   Signed,   Lindell Noe, MHA, BS, LPN Health Coach

## 2017-01-18 NOTE — Patient Instructions (Signed)
[name] , Thank you for taking time to come for your Medicare Wellness Visit. I appreciate your ongoing commitment to your health goals. Please review the following plan we discussed and let me know if I can assist you in the future.   These are the goals we discussed: Goals    . Increase physical activity          Starting 01/18/2017, I will continue to walk at least 30 min 3-4 days per week.        This is a list of the screening recommended for you and due dates:  Health Maintenance  Topic Date Due  . Flu Shot  01/22/2017*  . DEXA scan (bone density measurement)  12/24/2023*  . Tetanus Vaccine  10/24/2024*  . Pneumonia vaccines  Completed  *Topic was postponed. The date shown is not the original due date.   Preventive Care for Adults  A healthy lifestyle and preventive care can promote health and wellness. Preventive health guidelines for adults include the following key practices.  . A routine yearly physical is a good way to check with your health care provider about your health and preventive screening. It is a chance to share any concerns and updates on your health and to receive a thorough exam.  . Visit your dentist for a routine exam and preventive care every 6 months. Brush your teeth twice a day and floss once a day. Good oral hygiene prevents tooth decay and gum disease.  . The frequency of eye exams is based on your age, health, family medical history, use  of contact lenses, and other factors. Follow your health care provider's ecommendations for frequency of eye exams.  . Eat a healthy diet. Foods like vegetables, fruits, whole grains, low-fat dairy products, and lean protein foods contain the nutrients you need without too many calories. Decrease your intake of foods high in solid fats, added sugars, and salt. Eat the right amount of calories for you. Get information about a proper diet from your health care provider, if necessary.  . Regular physical exercise is  one of the most important things you can do for your health. Most adults should get at least 150 minutes of moderate-intensity exercise (any activity that increases your heart rate and causes you to sweat) each week. In addition, most adults need muscle-strengthening exercises on 2 or more days a week.  Silver Sneakers may be a benefit available to you. To determine eligibility, you may visit the website: www.silversneakers.com or contact program at (985)773-5129 Mon-Fri between 8AM-8PM.   . Maintain a healthy weight. The body mass index (BMI) is a screening tool to identify possible weight problems. It provides an estimate of body fat based on height and weight. Your health care provider can find your BMI and can help you achieve or maintain a healthy weight.   For adults 20 years and older: ? A BMI below 18.5 is considered underweight. ? A BMI of 18.5 to 24.9 is normal. ? A BMI of 25 to 29.9 is considered overweight. ? A BMI of 30 and above is considered obese.   . Maintain normal blood lipids and cholesterol levels by exercising and minimizing your intake of saturated fat. Eat a balanced diet with plenty of fruit and vegetables. Blood tests for lipids and cholesterol should begin at age 66 and be repeated every 5 years. If your lipid or cholesterol levels are high, you are over 50, or you are at high risk for heart disease,  you may need your cholesterol levels checked more frequently. Ongoing high lipid and cholesterol levels should be treated with medicines if diet and exercise are not working.  . If you smoke, find out from your health care provider how to quit. If you do not use tobacco, please do not start.  . If you choose to drink alcohol, please do not consume more than 2 drinks per day. One drink is considered to be 12 ounces (355 mL) of beer, 5 ounces (148 mL) of wine, or 1.5 ounces (44 mL) of liquor.  . If you are 56-94 years old, ask your health care provider if you should take  aspirin to prevent strokes.  . Use sunscreen. Apply sunscreen liberally and repeatedly throughout the day. You should seek shade when your shadow is shorter than you. Protect yourself by wearing long sleeves, pants, a wide-brimmed hat, and sunglasses year round, whenever you are outdoors.  . Once a month, do a whole body skin exam, using a mirror to look at the skin on your back. Tell your health care provider of new moles, moles that have irregular borders, moles that are larger than a pencil eraser, or moles that have changed in shape or color.

## 2017-01-18 NOTE — Progress Notes (Signed)
PCP notes:   Health maintenance:  Tetanus - pt declined  Flu vaccine - pt declined  Abnormal screenings:   Hearing - failed  Patient concerns:   None  Nurse concerns:  None  Next PCP appt:   02/22/17 @ 1315

## 2017-01-18 NOTE — Progress Notes (Signed)
Pre visit review using our clinic review tool, if applicable. No additional management support is needed unless otherwise documented below in the visit note. 

## 2017-01-25 ENCOUNTER — Encounter: Payer: Medicare Other | Admitting: Family Medicine

## 2017-02-22 ENCOUNTER — Ambulatory Visit (INDEPENDENT_AMBULATORY_CARE_PROVIDER_SITE_OTHER): Payer: Medicare Other | Admitting: Family Medicine

## 2017-02-22 ENCOUNTER — Encounter: Payer: Self-pay | Admitting: Family Medicine

## 2017-02-22 VITALS — BP 118/70 | HR 89 | Temp 98.4°F | Ht 63.0 in | Wt 111.0 lb

## 2017-02-22 DIAGNOSIS — E7849 Other hyperlipidemia: Secondary | ICD-10-CM

## 2017-02-22 DIAGNOSIS — M810 Age-related osteoporosis without current pathological fracture: Secondary | ICD-10-CM

## 2017-02-22 DIAGNOSIS — R634 Abnormal weight loss: Secondary | ICD-10-CM

## 2017-02-22 DIAGNOSIS — E559 Vitamin D deficiency, unspecified: Secondary | ICD-10-CM

## 2017-02-22 DIAGNOSIS — Z0000 Encounter for general adult medical examination without abnormal findings: Secondary | ICD-10-CM

## 2017-02-22 DIAGNOSIS — E784 Other hyperlipidemia: Secondary | ICD-10-CM

## 2017-02-22 DIAGNOSIS — Z85048 Personal history of other malignant neoplasm of rectum, rectosigmoid junction, and anus: Secondary | ICD-10-CM

## 2017-02-22 DIAGNOSIS — R609 Edema, unspecified: Secondary | ICD-10-CM | POA: Diagnosis not present

## 2017-02-22 DIAGNOSIS — K529 Noninfective gastroenteritis and colitis, unspecified: Secondary | ICD-10-CM | POA: Diagnosis not present

## 2017-02-22 DIAGNOSIS — I10 Essential (primary) hypertension: Secondary | ICD-10-CM | POA: Diagnosis not present

## 2017-02-22 NOTE — Assessment & Plan Note (Signed)
Refuses DEXA and med to treat. Recommend weight bearing exercise, calcium in diet and vit D supplement 400 IU 1-2 times daily.

## 2017-02-22 NOTE — Progress Notes (Signed)
Subjective:    Patient ID: [name], female    DOB: 1933-03-19, 83 y.o.   MRN: [id number]  HPI The patient presents for annual medicare wellness, complete physical and review of chronic health problems. He/She also has the following acute concerns today:  The patient saw Candis Musa, LPN for medicare wellness. Note reviewed in detail and important notes copied below. Tetanus - pt declined  Flu vaccine - pt declined Abnormal screenings:  Hearing - failed   Hx of rectal cancer, 2010: chronic diarrhea on paregoric  GI following colonoscopy. Weight loss:  Improved on remeron. Wt Readings from Last 3 Encounters:  02/22/17 111 lb (50.3 kg)  01/18/17 109 lb 4 oz (49.6 kg)  07/29/16 109 lb 12 oz (49.8 kg)  Body mass index is 19.66 kg/m.   Hypertension:    Good control on  Diltiazem and chlorthalidone BP Readings from Last 3 Encounters:  02/22/17 118/70  01/18/17 110/78  07/29/16 (!) 144/93  Using medication without problems or lightheadedness:  none Chest pain with exertion:none Edema: resolved on chlorthalidone. Short of breath:none Average home BPs: stable Other issues:  Elevated Cholesterol:  LDL at goal on colestipol. Lab Results  Component Value Date   CHOL 174 01/18/2017   HDL 88.80 01/18/2017   LDLCALC 71 01/18/2017   LDLDIRECT 114.6 11/16/2006   TRIG 74.0 01/18/2017   CHOLHDL 2 01/18/2017  Using medications without problems: Muscle aches:  Diet compliance: good Exercise: walking and yard work Other complaints:   Vit D def resolved on supplement.  Social History /Family History/Past Medical History reviewed in detail and updated in EMR if needed. Blood pressure 118/70, pulse 89, temperature 98.4 F (36.9 C), temperature source Oral, height 5\' 3"  (1.6 m), weight 111 lb (50.3 kg).   Review of Systems  Constitutional: Negative for fatigue and fever.  HENT: Negative for congestion.   Eyes: Negative for pain.  Respiratory: Negative for cough and  shortness of breath.   Cardiovascular: Negative for chest pain, palpitations and leg swelling.  Gastrointestinal: Negative for abdominal pain.  Genitourinary: Negative for dysuria and vaginal bleeding.  Musculoskeletal: Negative for back pain.  Neurological: Negative for syncope, light-headedness and headaches.  Psychiatric/Behavioral: Negative for dysphoric mood.       Objective:   Physical Exam  Constitutional: Vital signs are normal. She appears well-developed and well-nourished. She is cooperative.  Non-toxic appearance. She does not appear ill. No distress.  Thin appearing  HENT:  Head: Normocephalic.  Right Ear: Hearing, tympanic membrane, external ear and ear canal normal.  Left Ear: Hearing, tympanic membrane, external ear and ear canal normal.  Nose: Nose normal.  Eyes: Conjunctivae, EOM and lids are normal. Pupils are equal, round, and reactive to light. Lids are everted and swept, no foreign bodies found.  Neck: Trachea normal and normal range of motion. Neck supple. Carotid bruit is not present. No thyroid mass and no thyromegaly present.  Cardiovascular: Normal rate, regular rhythm, S1 normal, S2 normal, normal heart sounds and intact distal pulses.  Exam reveals no gallop.   No murmur heard. Pulmonary/Chest: Effort normal and breath sounds normal. No respiratory distress. She has no wheezes. She has no rhonchi. She has no rales.  Abdominal: Soft. Normal appearance and bowel sounds are normal. She exhibits no distension, no fluid wave, no abdominal bruit and no mass. There is no hepatosplenomegaly. There is no tenderness. There is no rebound, no guarding and no CVA tenderness. No hernia.  Lymphadenopathy:    She has no  cervical adenopathy.    She has no axillary adenopathy.  Neurological: She is alert. She has normal strength. No cranial nerve deficit or sensory deficit.  Skin: Skin is warm, dry and intact. No rash noted.  Psychiatric: Her speech is normal and behavior is  normal. Judgment normal. Her mood appears not anxious. Cognition and memory are normal. She does not exhibit a depressed mood.          Assessment & Plan:  The patient's preventative maintenance and recommended screening tests for an annual wellness exam were reviewed in full today. Brought up to date unless services declined.  Counselled on the importance of diet, exercise, and its role in overall health and mortality. The patient's FH and SH was reviewed, including their home life, tobacco status, and drug and alcohol status.   Vaccines:declined Tdap and flu Pap/DVE: not indicated Mammo: not indicated Bone Density: osteoporosis... Refuses dexa, refuses treatment other than CA, vit D and weight bearing exervise. Colon: per GI. Smoking Status:none ETOH/ drug UVO:ZDGU

## 2017-02-22 NOTE — Assessment & Plan Note (Signed)
Stable control on chlorthalidone

## 2017-02-22 NOTE — Assessment & Plan Note (Signed)
Improved with remeron and with improved control on chronic diarrhea.

## 2017-02-22 NOTE — Assessment & Plan Note (Signed)
Continued but learning to control better with paregoric. Taking earlier.

## 2017-02-22 NOTE — Progress Notes (Signed)
Pre visit review using our clinic review tool, if applicable. No additional management support is needed unless otherwise documented below in the visit note. 

## 2017-02-22 NOTE — Assessment & Plan Note (Signed)
Well controlled. Continue current medication.  

## 2017-07-12 ENCOUNTER — Other Ambulatory Visit: Payer: Self-pay | Admitting: Family Medicine

## 2017-10-04 DIAGNOSIS — R350 Frequency of micturition: Secondary | ICD-10-CM | POA: Diagnosis not present

## 2017-10-04 DIAGNOSIS — M545 Low back pain: Secondary | ICD-10-CM | POA: Diagnosis not present

## 2017-11-23 ENCOUNTER — Other Ambulatory Visit: Payer: Self-pay | Admitting: Family Medicine

## 2018-01-16 ENCOUNTER — Other Ambulatory Visit: Payer: Self-pay | Admitting: Family Medicine

## 2018-02-20 ENCOUNTER — Other Ambulatory Visit: Payer: Self-pay | Admitting: Family Medicine

## 2018-02-21 ENCOUNTER — Other Ambulatory Visit: Payer: Self-pay | Admitting: Family Medicine

## 2018-02-23 ENCOUNTER — Other Ambulatory Visit: Payer: Self-pay | Admitting: Family Medicine

## 2018-02-23 ENCOUNTER — Telehealth: Payer: Self-pay | Admitting: Family Medicine

## 2018-02-23 DIAGNOSIS — E559 Vitamin D deficiency, unspecified: Secondary | ICD-10-CM

## 2018-02-23 DIAGNOSIS — E782 Mixed hyperlipidemia: Secondary | ICD-10-CM

## 2018-02-23 DIAGNOSIS — M810 Age-related osteoporosis without current pathological fracture: Secondary | ICD-10-CM

## 2018-02-23 NOTE — Telephone Encounter (Signed)
-----   Message from Eustace Pen, LPN sent at 06/25/288  3:46 PM EDT ----- Regarding: Labs 5/3 Lab orders needed. Thank you.  Insurance:  [facility]

## 2018-02-24 ENCOUNTER — Ambulatory Visit (INDEPENDENT_AMBULATORY_CARE_PROVIDER_SITE_OTHER): Payer: Medicare Other

## 2018-02-24 VITALS — BP 106/74 | HR 81 | Temp 97.5°F | Ht 63.0 in | Wt 107.5 lb

## 2018-02-24 DIAGNOSIS — E782 Mixed hyperlipidemia: Secondary | ICD-10-CM | POA: Diagnosis not present

## 2018-02-24 DIAGNOSIS — Z0000 Encounter for general adult medical examination without abnormal findings: Secondary | ICD-10-CM | POA: Diagnosis not present

## 2018-02-24 DIAGNOSIS — E559 Vitamin D deficiency, unspecified: Secondary | ICD-10-CM

## 2018-02-24 LAB — LIPID PANEL
CHOLESTEROL: 165 mg/dL (ref 0–200)
HDL: 91.2 mg/dL (ref >39.00)
LDL Cholesterol: 64 mg/dL (ref 0–99)
NONHDL: 74.05
Total CHOL/HDL Ratio: 2
Triglycerides: 52 mg/dL (ref 0.0–149.0)
VLDL: 10.4 mg/dL (ref 0.0–40.0)

## 2018-02-24 LAB — COMPREHENSIVE METABOLIC PANEL
ALT: 14 U/L (ref 0–35)
AST: 15 U/L (ref 0–37)
Albumin: 4.2 g/dL (ref 3.5–5.2)
Alkaline Phosphatase: 71 U/L (ref 39–117)
BUN: 24 mg/dL — ABNORMAL HIGH (ref 6–23)
CALCIUM: 10.3 mg/dL (ref 8.4–10.5)
CO2: 33 meq/L — AB (ref 19–32)
CREATININE: 0.79 mg/dL (ref 0.40–1.20)
Chloride: 100 mEq/L (ref 96–112)
GFR: 73.89 mL/min (ref >60.00)
Glucose, Bld: 97 mg/dL (ref 70–99)
POTASSIUM: 3.4 meq/L — AB (ref 3.5–5.1)
Sodium: 141 mEq/L (ref 135–145)
TOTAL PROTEIN: 7 g/dL (ref 6.0–8.3)
Total Bilirubin: 0.6 mg/dL (ref 0.2–1.2)

## 2018-02-24 LAB — VITAMIN D 25 HYDROXY (VIT D DEFICIENCY, FRACTURES): VITD: 28.49 ng/mL — ABNORMAL LOW (ref 30.00–100.00)

## 2018-02-24 NOTE — Patient Instructions (Signed)
[name] , Thank you for taking time to come for your Medicare Wellness Visit. I appreciate your ongoing commitment to your health goals. Please review the following plan we discussed and let me know if I can assist you in the future.   These are the goals we discussed: Goals    . Increase physical activity     Starting 02/24/18, I will continue to walk at least 45 minutes daily.        This is a list of the screening recommended for you and due dates:  Health Maintenance  Topic Date Due  . DEXA scan (bone density measurement)  12/24/2023*  . Tetanus Vaccine  10/24/2024*  . Flu Shot  05/25/2018  . Pneumonia vaccines  Completed  *Topic was postponed. The date shown is not the original due date.   Preventive Care for Adults  A healthy lifestyle and preventive care can promote health and wellness. Preventive health guidelines for adults include the following key practices.  . A routine yearly physical is a good way to check with your health care provider about your health and preventive screening. It is a chance to share any concerns and updates on your health and to receive a thorough exam.  . Visit your dentist for a routine exam and preventive care every 6 months. Brush your teeth twice a day and floss once a day. Good oral hygiene prevents tooth decay and gum disease.  . The frequency of eye exams is based on your age, health, family medical history, use  of contact lenses, and other factors. Follow your health care provider's recommendations for frequency of eye exams.  . Eat a healthy diet. Foods like vegetables, fruits, whole grains, low-fat dairy products, and lean protein foods contain the nutrients you need without too many calories. Decrease your intake of foods high in solid fats, added sugars, and salt. Eat the right amount of calories for you. Get information about a proper diet from your health care provider, if necessary.  . Regular physical exercise is one of the most  important things you can do for your health. Most adults should get at least 150 minutes of moderate-intensity exercise (any activity that increases your heart rate and causes you to sweat) each week. In addition, most adults need muscle-strengthening exercises on 2 or more days a week.  Silver Sneakers may be a benefit available to you. To determine eligibility, you may visit the website: www.silversneakers.com or contact program at 7823693008 Mon-Fri between 8AM-8PM.   . Maintain a healthy weight. The body mass index (BMI) is a screening tool to identify possible weight problems. It provides an estimate of body fat based on height and weight. Your health care provider can find your BMI and can help you achieve or maintain a healthy weight.   For adults 20 years and older: ? A BMI below 18.5 is considered underweight. ? A BMI of 18.5 to 24.9 is normal. ? A BMI of 25 to 29.9 is considered overweight. ? A BMI of 30 and above is considered obese.   . Maintain normal blood lipids and cholesterol levels by exercising and minimizing your intake of saturated fat. Eat a balanced diet with plenty of fruit and vegetables. Blood tests for lipids and cholesterol should begin at age 73 and be repeated every 5 years. If your lipid or cholesterol levels are high, you are over 50, or you are at high risk for heart disease, you may need your cholesterol levels checked more  frequently. Ongoing high lipid and cholesterol levels should be treated with medicines if diet and exercise are not working.  . If you smoke, find out from your health care provider how to quit. If you do not use tobacco, please do not start.  . If you choose to drink alcohol, please do not consume more than 2 drinks per day. One drink is considered to be 12 ounces (355 mL) of beer, 5 ounces (148 mL) of wine, or 1.5 ounces (44 mL) of liquor.  . If you are 20-10 years old, ask your health care provider if you should take aspirin to prevent  strokes.  . Use sunscreen. Apply sunscreen liberally and repeatedly throughout the day. You should seek shade when your shadow is shorter than you. Protect yourself by wearing long sleeves, pants, a wide-brimmed hat, and sunglasses year round, whenever you are outdoors.  . Once a month, do a whole body skin exam, using a mirror to look at the skin on your back. Tell your health care provider of new moles, moles that have irregular borders, moles that are larger than a pencil eraser, or moles that have changed in shape or color.

## 2018-02-24 NOTE — Progress Notes (Signed)
PCP notes:   Health maintenance:  No gaps identified.   Abnormal screenings:   Fall risk - hx of single fall Fall Risk  02/24/2018 01/18/2017 01/22/2016 01/24/2015 12/14/2013  Falls in the past year? Yes No No No No  Comment fell after tripping over cords - - - -  Number falls in past yr: 1 - - - -  Injury with Fall? Yes - - - -   Patient concerns:   None  Nurse concerns:  None  Next PCP appt:   03/03/18 @ 1115

## 2018-02-24 NOTE — Progress Notes (Signed)
I reviewed health advisor's note, was available for consultation, and agree with documentation and plan.  

## 2018-02-24 NOTE — Progress Notes (Signed)
Subjective:   [name] is a 84 y.o. female who presents for Medicare Annual (Subsequent) preventive examination.  Review of Systems:  N/A Cardiac Risk Factors include: advanced age (>8men, >74 women);dyslipidemia;hypertension     Objective:     Vitals: BP 106/74 (BP Location: Right Arm, Patient Position: Sitting, Cuff Size: Normal)   Pulse 81   Temp (!) 97.5 F (36.4 C) (Oral)   Ht 5\' 3"  (1.6 m) Comment: no shoes  Wt 107 lb 8 oz (48.8 kg)   SpO2 94%   BMI 19.04 kg/m   Body mass index is 19.04 kg/m.  Advanced Directives 02/24/2018 01/18/2017 01/22/2016  Does Patient Have a Medical Advance Directive? Yes Yes Yes  Type of Advance Directive Living will [place];Living will [place];Living will  Does patient want to make changes to medical advance directive? - - No - Patient declined  Copy of [place] in Chart? - No - copy requested No - copy requested    Tobacco Social History   Tobacco Use  Smoking Status Never Smoker  Smokeless Tobacco Never Used     Counseling given: No   Clinical Intake:  Pre-visit preparation completed: Yes  Pain : No/denies pain Pain Score: 0-No pain     Nutritional Status: BMI of 19-24  Normal Nutritional Risks: None Diabetes: No  How often do you need to have someone help you when you read instructions, pamphlets, or other written materials from your doctor or pharmacy?: 1 - Never What is the last grade level you completed in school?: 12th grade + 2 yrs college  Interpreter Needed?: No  Comments: pt lives with spouse Information entered by :: LPinson, LPN  Past Medical History:  Diagnosis Date  . Allergic rhinitis, cause unspecified   . Diarrhea   . Edema   . History of colon cancer    s/p surgery, chemotherapy  . Internal hemorrhoids without mention of complication   . Malignant neoplasm of rectum ([place])   . Osteoarthrosis, unspecified whether generalized or  localized, unspecified site   . Osteoporosis, unspecified   . Other acute reactions to stress   . Other and unspecified hyperlipidemia   . Other screening mammogram   . Pain in limb   . Special screening for osteoporosis   . Ulceration of vulva, unspecified   . Unspecified essential hypertension    Past Surgical History:  Procedure Laterality Date  . dental implants    . PARTIAL COLECTOMY  9/10   temporary colostomy   Family History  Problem Relation Age of Onset  . Stroke Mother   . Arthritis Mother   . Arthritis Brother   . Depression Sister    Social History   Socioeconomic History  . Marital status: Married    Spouse name: Not on file  . Number of children: 3  . Years of education: Not on file  . Highest education level: Not on file  Occupational History  . Occupation: Teaching laboratory technician  . Financial resource strain: Not on file  . Food insecurity:    Worry: Not on file    Inability: Not on file  . Transportation needs:    Medical: Not on file    Non-medical: Not on file  Tobacco Use  . Smoking status: Never Smoker  . Smokeless tobacco: Never Used  Substance and Sexual Activity  . Alcohol use: No    Alcohol/week: 0.0 oz  . Drug use: No  .  Sexual activity: Yes  Lifestyle  . Physical activity:    Days per week: Not on file    Minutes per session: Not on file  . Stress: Not on file  Relationships  . Social connections:    Talks on phone: Not on file    Gets together: Not on file    Attends religious service: Not on file    Active member of club or organization: Not on file    Attends meetings of clubs or organizations: Not on file    Relationship status: Not on file  Other Topics Concern  . Not on file  Social History Narrative  . Not on file    Outpatient Encounter Medications as of 02/24/2018  Medication Sig  . chlorthalidone (HYGROTON) 25 MG tablet Take 1 tablet (25 mg total) by mouth daily.  . Cholecalciferol (VITAMIN D  PO) Take 5,000 Units by mouth daily.  . colestipol (COLESTID) 1 G tablet Take 2 g by mouth 4 (four) times daily.  Marland Kitchen diltiazem (CARDIZEM CD) 240 MG 24 hr capsule Take 1 capsule (240 mg total) by mouth daily.  Marland Kitchen glucosamine-chondroitin 500-400 MG tablet Take 2 tablets by mouth daily.   . mirtazapine (REMERON) 15 MG tablet Take 1 tablet (15 mg total) by mouth at bedtime.  . paregoric 2 MG/5ML solution Take 5 mLs by mouth as needed for diarrhea or loose stools.  . [DISCONTINUED] paregoric 2 MG/5ML solution TAKE ONE TO TWO TEASPOONFULS THREE TIMES DAILY AS NEEDED.  Marland Kitchen ALPRAZolam (XANAX) 0.5 MG tablet Take 0.5 tablets (0.25 mg total) by mouth 2 (two) times daily as needed. For anxiety (Patient not taking: Reported on 02/24/2018)   No facility-administered encounter medications on file as of 02/24/2018.     Activities of Daily Living In your present state of health, do you have any difficulty performing the following activities: 02/24/2018  Hearing? N  Vision? N  Difficulty concentrating or making decisions? N  Walking or climbing stairs? N  Dressing or bathing? N  Doing errands, shopping? N  Preparing Food and eating ? N  Using the Toilet? N  In the past six months, have you accidently leaked urine? N  Do you have problems with loss of bowel control? N  Managing your Medications? N  Managing your Finances? N  Housekeeping or managing your Housekeeping? N  Some recent data might be hidden    Patient Care Team: Jinny Sanders, MD as PCP - General Mixon, Vinie Sill as Consulting Physician (Gastroenterology)    Assessment:   This is a routine wellness examination for Grethel.   Hearing Screening   125Hz  250Hz  500Hz  1000Hz  2000Hz  3000Hz  4000Hz  6000Hz  8000Hz   Right ear:   40 40 40  40    Left ear:   40 40 40  40      Visual Acuity Screening   Right eye Left eye Both eyes  Without correction: 20/25-1 20/100 20/25-1  With correction:        Exercise Activities and Dietary  recommendations Current Exercise Habits: Home exercise routine, Type of exercise: walking, Time (Minutes): 45, Frequency (Times/Week): 7, Weekly Exercise (Minutes/Week): 315, Intensity: Mild, Exercise limited by: None identified  Goals    . Increase physical activity     Starting 02/24/18, I will continue to walk at least 45 minutes daily.        Fall Risk Fall Risk  02/24/2018 01/18/2017 01/22/2016 01/24/2015 12/14/2013  Falls in the past year? Yes No No No No  Comment  fell after tripping over cords - - - -  Number falls in past yr: 1 - - - -  Injury with Fall? Yes - - - -   Depression Screen PHQ 2/9 Scores 02/24/2018 01/18/2017 01/22/2016 01/24/2015  PHQ - 2 Score 0 0 0 0  PHQ- 9 Score 0 - - -     Cognitive Function MMSE - Mini Mental State Exam 02/24/2018 01/18/2017 01/22/2016  Orientation to time 5 5 5   Orientation to Place 5 5 5   Registration 3 3 3   Attention/ Calculation 0 0 0  Recall 3 3 3   Language- name 2 objects 0 0 0  Language- repeat 1 1 1   Language- follow 3 step command 3 3 3   Language- read & follow direction 0 0 0  Write a sentence 0 0 0  Copy design 0 0 0  Total score 20 20 20      PLEASE NOTE: A Mini-Cog screen was completed. Maximum score is 20. A value of 0 denotes this part of [place] MMSE was not completed or the patient failed this part of the Mini-Cog screening.   Mini-Cog Screening Orientation to Time - Max 5 pts Orientation to Place - Max 5 pts Registration - Max 3 pts Recall - Max 3 pts Language Repeat - Max 1 pts Language Follow 3 Step Command - Max 3 pts     Immunization History  Administered Date(s) Administered  . Influenza Whole 09/25/2009  . Pneumococcal Conjugate-13 12/14/2013  . Pneumococcal Polysaccharide-23 10/25/2004  . Td 10/25/2004    Screening Tests Health Maintenance  Topic Date Due  . DEXA SCAN  12/24/2023 (Originally 04/03/2000)  . TETANUS/TDAP  10/24/2024 (Originally 10/25/2014)  . INFLUENZA VACCINE  05/25/2018  . PNA vac Low Risk  Adult  Completed      Plan:     I have personally reviewed, addressed, and noted the following in the patient's chart:  A. Medical and social history B. Use of alcohol, tobacco or illicit drugs  C. Current medications and supplements D. Functional ability and status E.  Nutritional status F.  Physical activity G. Advance directives H. List of other physicians I.  Hospitalizations, surgeries, and ER visits in previous 12 months J.  [place] to include hearing, vision, cognitive, depression L. Referrals and appointments - none  In addition, I have reviewed and discussed with patient certain preventive protocols, quality metrics, and best practice recommendations. A written personalized care plan for preventive services as well as general preventive health recommendations were provided to patient.  See attached scanned questionnaire for additional information.   Signed,   Lindell Noe, MHA, BS, LPN Health Coach

## 2018-03-03 ENCOUNTER — Encounter: Payer: Medicare Other | Admitting: Family Medicine

## 2018-04-20 DIAGNOSIS — L0201 Cutaneous abscess of face: Secondary | ICD-10-CM | POA: Diagnosis not present

## 2018-05-16 ENCOUNTER — Encounter: Payer: Self-pay | Admitting: Family Medicine

## 2018-05-16 ENCOUNTER — Ambulatory Visit (INDEPENDENT_AMBULATORY_CARE_PROVIDER_SITE_OTHER): Payer: Medicare Other | Admitting: Family Medicine

## 2018-05-16 VITALS — BP 109/68 | HR 82 | Temp 97.5°F | Ht 63.0 in | Wt 107.8 lb

## 2018-05-16 DIAGNOSIS — Z85048 Personal history of other malignant neoplasm of rectum, rectosigmoid junction, and anus: Secondary | ICD-10-CM | POA: Diagnosis not present

## 2018-05-16 DIAGNOSIS — E7849 Other hyperlipidemia: Secondary | ICD-10-CM

## 2018-05-16 DIAGNOSIS — Z0000 Encounter for general adult medical examination without abnormal findings: Secondary | ICD-10-CM

## 2018-05-16 DIAGNOSIS — E559 Vitamin D deficiency, unspecified: Secondary | ICD-10-CM | POA: Diagnosis not present

## 2018-05-16 DIAGNOSIS — K529 Noninfective gastroenteritis and colitis, unspecified: Secondary | ICD-10-CM

## 2018-05-16 DIAGNOSIS — R634 Abnormal weight loss: Secondary | ICD-10-CM

## 2018-05-16 DIAGNOSIS — I10 Essential (primary) hypertension: Secondary | ICD-10-CM | POA: Diagnosis not present

## 2018-05-16 DIAGNOSIS — E876 Hypokalemia: Secondary | ICD-10-CM

## 2018-05-16 MED ORDER — MIRTAZAPINE 30 MG PO TABS: 30.0000 mg | ORAL_TABLET | Freq: Every day | ORAL | 3 refills | Status: DC

## 2018-05-16 NOTE — Assessment & Plan Note (Signed)
Need to replete.

## 2018-05-16 NOTE — Progress Notes (Signed)
Subjective:    Patient ID: [name], female    DOB: Apr 01, 1933, 84 y.o.   MRN: [id number]  HPI  The patient presents for  complete physical and review of chronic health problems. He/She also has the following acute concerns today: brother with carotid stenosis critcal.  He was a smoker and had high cholesterol.  She has neither. No screen indicated.  The patient saw Candis Musa, LPN for medicare wellness. Note reviewed in detail and important notes copied below. Health maintenance:  No gaps identified.   Abnormal screenings:   Fall risk - hx of single fall Fall Risk  02/24/2018 01/18/2017 01/22/2016 01/24/2015 12/14/2013  Falls in the past year? Yes No No No No  Comment fell after tripping over cords - - - -  Number falls in past yr: 1 - - - -  Injury with Fall? Yes - - - -     05/16/18 Today  Hypertension:    Good control on diltiazem and chlorthalidone BP Readings from Last 3 Encounters:  05/16/18 109/68  02/24/18 106/74  02/22/17 118/70  Using medication without problems or lightheadedness:  NONE Chest pain with exertion: none Edema: occ with heat.. chlorthalidone treats well. Short of breath:none Average home BPs: Other issues:  Elevated Cholesterol:  LDL at goal on current regimen colestipol. Lab Results  Component Value Date   CHOL 165 02/24/2018   HDL 91.20 02/24/2018   LDLCALC 64 02/24/2018   LDLDIRECT 114.6 11/16/2006   TRIG 52.0 02/24/2018   CHOLHDL 2 02/24/2018  Using medications without problems: Muscle aches:  Diet compliance: good, but cannot gain weight. Body mass index is 19.09 kg/m. Exercise: walking Other complaints:  Vit D def: low.. She is taking vit D once daily. Need to replete  Hx of rectal cancer, 2010: chronic diarrhea on paregoric  GI following. Weight loss:  Improved on Remeron initially.. Not recently. Wt Readings from Last 3 Encounters:  05/16/18 107 lb 12 oz (48.9 kg)  02/24/18 107 lb 8 oz (48.8 kg)  02/22/17 111  lb (50.3 kg)   Social History /Family History/Past Medical History reviewed in detail and updated in EMR if needed. Blood pressure 109/68, pulse 82, temperature (!) 97.5 F (36.4 C), temperature source Oral, height 5\' 3"  (1.6 m), weight 107 lb 12 oz (48.9 kg), SpO2 93 %.   Review of Systems  Constitutional: Negative for fatigue and fever.  HENT: Negative for congestion.   Eyes: Negative for pain.  Respiratory: Negative for cough and shortness of breath.   Cardiovascular: Negative for chest pain, palpitations and leg swelling.  Gastrointestinal: Positive for diarrhea. Negative for abdominal pain.  Genitourinary: Negative for dysuria and vaginal bleeding.  Musculoskeletal: Negative for back pain.  Neurological: Negative for syncope, light-headedness and headaches.  Psychiatric/Behavioral: Negative for dysphoric mood.       Objective:   Physical Exam  Constitutional: Vital signs are normal. She appears well-developed and well-nourished. She is cooperative.  Non-toxic appearance. She does not appear ill. No distress.  HENT:  Head: Normocephalic.  Right Ear: Hearing, tympanic membrane, external ear and ear canal normal.  Left Ear: Hearing, tympanic membrane, external ear and ear canal normal.  Nose: Nose normal.  Eyes: Pupils are equal, round, and reactive to light. Conjunctivae, EOM and lids are normal. Lids are everted and swept, no foreign bodies found.  Neck: Trachea normal and normal range of motion. Neck supple. Carotid bruit is not present. No thyroid mass and no thyromegaly present.  Cardiovascular: Normal rate,  regular rhythm, S1 normal, S2 normal, normal heart sounds and intact distal pulses. Exam reveals no gallop.  No murmur heard. Pulmonary/Chest: Effort normal and breath sounds normal. No respiratory distress. She has no wheezes. She has no rhonchi. She has no rales.  Abdominal: Soft. Normal appearance and bowel sounds are normal. She exhibits no distension, no fluid wave,  no abdominal bruit and no mass. There is no hepatosplenomegaly. There is no tenderness. There is no rebound, no guarding and no CVA tenderness. No hernia.  Lymphadenopathy:    She has no cervical adenopathy.    She has no axillary adenopathy.  Neurological: She is alert. She has normal strength. No cranial nerve deficit or sensory deficit.  Skin: Skin is warm, dry and intact. No rash noted.  Psychiatric: Her speech is normal and behavior is normal. Judgment normal. Her mood appears not anxious. Cognition and memory are normal. She does not exhibit a depressed mood.          Assessment & Plan:  The patient's preventative maintenance and recommended screening tests for an annual wellness exam were reviewed in full today. Brought up to date unless services declined.  Counselled on the importance of diet, exercise, and its role in overall health and mortality. The patient's FH and SH was reviewed, including their home life, tobacco status, and drug and alcohol status.   Vaccines:declined Tdap  Pap/DVE: not indicated Mammo: not indicated Bone Density: osteoporosis... Refuses dexa, refuses treatment other than CA, vit D and weight bearing exercise. Colon: per GI.Next appt in 12/202 for rectal cancer history Smoking Status:none ETOH/ drug EKC:MKLK

## 2018-05-16 NOTE — Assessment & Plan Note (Signed)
Trial of Remeron a higher dose.

## 2018-05-16 NOTE — Assessment & Plan Note (Signed)
Well controlled. Continue current medication.  

## 2018-05-16 NOTE — Assessment & Plan Note (Signed)
Likely due to chronic diarrhea and possibly chlorthalidone.. Increase potassium intake.

## 2018-05-16 NOTE — Assessment & Plan Note (Signed)
Improved with paregoric.

## 2018-05-16 NOTE — Patient Instructions (Addendum)
Try a course of Remeron at 30 mg daily at bedtime.  Increase vit D to twice daily Increase potassium in die tor decrease use of chlorthalidone for swelling.   Hypokalemia Hypokalemia means that the amount of potassium in the blood is lower than normal.Potassium is a chemical that helps regulate the amount of fluid in the body (electrolyte). It also stimulates muscle tightening (contraction) and helps nerves work properly.Normally, most of the body's potassium is inside of cells, and only a very small amount is in the blood. Because the amount in the blood is so small, minor changes to potassium levels in the blood can be life-threatening. What are the causes? This condition may be caused by:  Antibiotic medicine.  Diarrhea or vomiting. Taking too much of a medicine that helps you have a bowel movement (laxative) can cause diarrhea and lead to hypokalemia.  Chronic kidney disease (CKD).  Medicines that help the body get rid of excess fluid (diuretics).  Eating disorders, such as bulimia.  Low magnesium levels in the body.  Sweating a lot.  What are the signs or symptoms? Symptoms of this condition include:  Weakness.  Constipation.  Fatigue.  Muscle cramps.  Mental confusion.  Skipped heartbeats or irregular heartbeat (palpitations).  Tingling or numbness.  How is this diagnosed? This condition is diagnosed with a blood test. How is this treated? Hypokalemia can be treated by taking potassium supplements by mouth or adjusting the medicines that you take. Treatment may also include eating more foods that contain a lot of potassium. If your potassium level is very low, you may need to get potassium through an IV tube in one of your veins and be monitored in the hospital. Follow these instructions at home:  Take over-the-counter and prescription medicines only as told by your health care provider. This includes vitamins and supplements.  Eat a healthy diet. A healthy  diet includes fresh fruits and vegetables, whole grains, healthy fats, and lean proteins.  If instructed, eat more foods that contain a lot of potassium, such as: ? Nuts, such as peanuts and pistachios. ? Seeds, such as sunflower seeds and pumpkin seeds. ? Peas, lentils, and lima beans. ? Whole grain and bran cereals and breads. ? Fresh fruits and vegetables, such as apricots, avocado, bananas, cantaloupe, kiwi, oranges, tomatoes, asparagus, and potatoes. ? Orange juice. ? Tomato juice. ? Red meats. ? Yogurt.  Keep all follow-up visits as told by your health care provider. This is important. Contact a health care provider if:  You have weakness that gets worse.  You feel your heart pounding or racing.  You vomit.  You have diarrhea.  You have diabetes (diabetes mellitus) and you have trouble keeping your blood sugar (glucose) in your target range. Get help right away if:  You have chest pain.  You have shortness of breath.  You have vomiting or diarrhea that lasts for more than 2 days.  You faint. This information is not intended to replace advice given to you by your health care provider. Make sure you discuss any questions you have with your health care provider. Document Released: 10/11/2005 Document Revised: 05/29/2016 Document Reviewed: 05/29/2016 Elsevier Interactive Patient Education  2018 [employer].

## 2018-05-22 ENCOUNTER — Other Ambulatory Visit: Payer: Self-pay | Admitting: Family Medicine

## 2018-06-19 ENCOUNTER — Other Ambulatory Visit: Payer: Self-pay | Admitting: Family Medicine

## 2018-07-27 ENCOUNTER — Ambulatory Visit (INDEPENDENT_AMBULATORY_CARE_PROVIDER_SITE_OTHER): Payer: Medicare Other | Admitting: Family Medicine

## 2018-07-27 ENCOUNTER — Encounter: Payer: Self-pay | Admitting: Family Medicine

## 2018-07-27 ENCOUNTER — Encounter (INDEPENDENT_AMBULATORY_CARE_PROVIDER_SITE_OTHER): Payer: Self-pay

## 2018-07-27 VITALS — Ht 63.0 in

## 2018-07-27 DIAGNOSIS — M1990 Unspecified osteoarthritis, unspecified site: Secondary | ICD-10-CM

## 2018-07-28 NOTE — Progress Notes (Signed)
Pt scheduled wrong.

## 2018-07-31 ENCOUNTER — Encounter: Payer: Self-pay | Admitting: Family Medicine

## 2018-07-31 ENCOUNTER — Ambulatory Visit (INDEPENDENT_AMBULATORY_CARE_PROVIDER_SITE_OTHER): Payer: Medicare Other | Admitting: Family Medicine

## 2018-07-31 ENCOUNTER — Ambulatory Visit (INDEPENDENT_AMBULATORY_CARE_PROVIDER_SITE_OTHER)
Admission: RE | Admit: 2018-07-31 | Discharge: 2018-07-31 | Disposition: A | Discharge status: Home / Self Care | Payer: Medicare Other | Source: Ambulatory Visit | Attending: Family Medicine | Admitting: Family Medicine

## 2018-07-31 VITALS — BP 110/74 | HR 88 | Temp 97.8°F | Ht 63.0 in | Wt 108.0 lb

## 2018-07-31 DIAGNOSIS — M170 Bilateral primary osteoarthritis of knee: Secondary | ICD-10-CM

## 2018-07-31 DIAGNOSIS — M25561 Pain in right knee: Secondary | ICD-10-CM

## 2018-07-31 MED ORDER — METHYLPREDNISOLONE ACETATE 40 MG/ML IJ SUSP
80.0000 mg | Freq: Once | INTRAMUSCULAR | Status: AC
  Administered 2018-07-31: 80 mg via INTRA_ARTICULAR

## 2018-07-31 NOTE — Progress Notes (Signed)
Dr. Frederico Hamman T. Devonn Giampietro, MD, [facility] [place], [address] Phone: [phone] Fax: [phone]  07/31/2018  Patient: [name], MRN: [id number], DOB: Oct 09, 1933, 84 y.o.  Primary Physician:  Jinny Sanders, MD   Chief Complaint  Patient presents with  . Knee Pain    Bilateral-wants injections   Subjective:   [name] is a 84 y.o. very pleasant female patient who presents with the following:  Hurt off and on and some days not that bad. Taking sister to [place].  Patient presents with right greater than left knee pain.  She has no acute injury, but her knees have been hurting her more over the last month or 2.  They have been bothering her off and on prior to this for quite some time.  She has no prior significant history is a fracture and no prior surgery.  She does have some osteoarthritis in multiple other joints including her hands and others.  R > L.  No old knee probs.   B knee inj  Past Medical History, Surgical History, Social History, Family History, Problem List, Medications, and Allergies have been reviewed and updated if relevant.  Patient Active Problem List   Diagnosis Date Noted  . Anterior neck pain 07/23/2016  . Peripheral edema 07/23/2016  . Hypokalemia 01/27/2016  . Abnormal weight loss 01/27/2016  . Counseling regarding end of life decision making 01/24/2015  . Vitamin D deficiency 12/12/2012  . Chronic diarrhea 10/27/2009  . RECTAL CANCER 07/23/2009  . ALLERGIC RHINITIS 01/16/2009  . HEMORRHOIDS, INTERNAL 11/20/2008  . ANXIETY, SITUATIONAL 05/31/2007  . Hyperlipidemia 05/30/2007  . Essential hypertension, benign 05/30/2007  . OSTEOARTHRITIS 05/30/2007  . Osteoporosis 05/30/2007    Past Medical History:  Diagnosis Date  . Allergic rhinitis, cause unspecified   . Diarrhea   . Edema   . History of colon cancer    s/p surgery, chemotherapy  . Internal hemorrhoids without  mention of complication   . Malignant neoplasm of rectum ([place])   . Osteoarthrosis, unspecified whether generalized or localized, unspecified site   . Osteoporosis, unspecified   . Other acute reactions to stress   . Other and unspecified hyperlipidemia   . Other screening mammogram   . Pain in limb   . Special screening for osteoporosis   . Ulceration of vulva, unspecified   . Unspecified essential hypertension     Past Surgical History:  Procedure Laterality Date  . dental implants    . PARTIAL COLECTOMY  9/10   temporary colostomy    Social History   Socioeconomic History  . Marital status: Married    Spouse name: Not on file  . Number of children: 3  . Years of education: Not on file  . Highest education level: Not on file  Occupational History  . Occupation: Teaching laboratory technician  . Financial resource strain: Not on file  . Food insecurity:    Worry: Not on file    Inability: Not on file  . Transportation needs:    Medical: Not on file    Non-medical: Not on file  Tobacco Use  . Smoking status: Never Smoker  . Smokeless tobacco: Never Used  Substance and Sexual Activity  . Alcohol use: No    Alcohol/week: 0.0 standard drinks  . Drug use: No  . Sexual activity: Yes  Lifestyle  . Physical activity:    Days per week: Not on file  Minutes per session: Not on file  . Stress: Not on file  Relationships  . Social connections:    Talks on phone: Not on file    Gets together: Not on file    Attends religious service: Not on file    Active member of club or organization: Not on file    Attends meetings of clubs or organizations: Not on file    Relationship status: Not on file  . Intimate partner violence:    Fear of current or ex partner: Not on file    Emotionally abused: Not on file    Physically abused: Not on file    Forced sexual activity: Not on file  Other Topics Concern  . Not on file  Social History Narrative  . Not on file      Family History  Problem Relation Age of Onset  . Stroke Mother   . Arthritis Mother   . Arthritis Brother   . Depression Sister     No Known Allergies  Medication list reviewed and updated in full in [place].  GEN: No fevers, chills. Nontoxic. Primarily MSK c/o today. MSK: Detailed in the HPI GI: tolerating PO intake without difficulty Neuro: No numbness, parasthesias, or tingling associated. Otherwise the pertinent positives of the ROS are noted above.   Objective:   BP 110/74   Pulse 88   Temp 97.8 F (36.6 C) (Oral)   Ht 5\' 3"  (1.6 m)   Wt 108 lb (49 kg)   BMI 19.13 kg/m    GEN: WDWN, NAD, Non-toxic, Alert & Oriented x 3 HEENT: Atraumatic, Normocephalic.  Ears and Nose: No external deformity. EXTR: No clubbing/cyanosis/edema NEURO: Normal gait.  PSYCH: Normally interactive. Conversant. Not depressed or anxious appearing.  Calm demeanor.    Bilateral knee exam: Right knee lacks 5 degrees of extension and has flexion to 110 degrees.  Left knee lacks 3 degrees of extension and has flexion to 122 degrees.  Stable to varus and valgus stress bilaterally.  ACL and PCL are intact bilaterally.  On the left side there is no pain with McMurray's and flexion pinch, but on the right there is pain with flexion pinch and McMurray's.  Bounce home causes no pain.  There is joint line tenderness on the right greater than left, particular on the right knee.  To lesser extent this is present on the left side.  Radiology: No results found.  Assessment and Plan:   Bilateral primary osteoarthritis of knee - Plan: methylPREDNISolone acetate (DEPO-MEDROL) injection 80 mg, methylPREDNISolone acetate (DEPO-MEDROL) injection 80 mg  Recurrent pain of right knee - Plan: DG Knee 4 Views W/Patella Right  Recommended she continue to be active is much as possible, and she is doing a very good job with this overall.  Given her upcoming trip to [place], where she is going to care  for her 66 year old sister, I am going to do bilateral knee injections to calm down some of her pain.  She does have advanced medial compartmental osteoarthritis on the right side.  She has tricompartmental osteoarthritis bilaterally.  On the left side she has more moderate osteoarthritis on the medial compartment, with tricompartmental osteoarthritis.  Knee Injection, RIGHT Date of procedure: 07/31/2018 Patient verbally consented to procedure. Risks (including potential rare risk of infection), benefits, and alternatives explained. Sterilely prepped with Chloraprep. Ethyl cholride used for anesthesia. 8 cc Lidocaine 1% mixed with 2 mL Depo-Medrol 40 mg injected using the anteromedial approach without difficulty. No complications  with procedure and tolerated well. Patient had decreased pain post-injection.  Medication: 2 mL of Depo-Medrol 40 mg, equaling Depo-Medrol 80 mg total  Knee Injection, LEFT Date of procedure: 07/31/2018 Patient verbally consented to procedure. Risks (including potential rare risk of infection), benefits, and alternatives explained. Sterilely prepped with Chloraprep. Ethyl cholride used for anesthesia. 8 cc Lidocaine 1% mixed with 2 mL Depo-Medrol 40 mg injected using the anteromedial approach without difficulty. No complications with procedure and tolerated well. Patient had decreased pain post-injection.  Medication: 2 mL of Depo-Medrol 40 mg, equaling Depo-Medrol 80 mg total   Follow-up: No follow-ups on file.  Meds ordered this encounter  Medications  . methylPREDNISolone acetate (DEPO-MEDROL) injection 80 mg  . methylPREDNISolone acetate (DEPO-MEDROL) injection 80 mg   Orders Placed This Encounter  Procedures  . DG Knee 4 Views W/Patella Right    Signed,  Frederico Hamman T. Dennys Traughber, MD   Allergies as of 07/31/2018   No Known Allergies     Medication List        Accurate as of 07/31/18  1:53 PM. Always use your most recent med list.            chlorthalidone 25 MG tablet Commonly known as:  HYGROTON Take 1 tablet (25 mg total) by mouth daily.   colestipol 1 g tablet Commonly known as:  COLESTID Take 2 g by mouth 4 (four) times daily.   diltiazem 240 MG 24 hr capsule Commonly known as:  CARDIZEM CD Take 1 capsule (240 mg total) by mouth daily.   glucosamine-chondroitin 500-400 MG tablet Take 2 tablets by mouth daily.   mirtazapine 30 MG tablet Commonly known as:  REMERON Take 1 tablet (30 mg total) by mouth at bedtime.   paregoric 2 MG/5ML solution Take 5 mLs by mouth as needed for diarrhea or loose stools.   VITAMIN D PO Take 5,000 Units by mouth daily.

## 2018-11-29 DIAGNOSIS — K529 Noninfective gastroenteritis and colitis, unspecified: Secondary | ICD-10-CM | POA: Diagnosis not present

## 2019-01-23 ENCOUNTER — Telehealth: Payer: Self-pay

## 2019-01-23 NOTE — Telephone Encounter (Signed)
Pt said for 2 - 3 days pt has rt lower back pain and frequency of urine. No pain upon urination. No fever, no cough or SOB. No traveling and no exposure to known corona virus or flu. Pt has not left her house in the last 2 weeks; does not have video capability and does not want to come to office. Please advise. [address] [facility].

## 2019-01-23 NOTE — Telephone Encounter (Signed)
Set up telephone visit. Verify no facetime capabilities as well. Also can pt drop off urine sample prior to appt?

## 2019-01-24 ENCOUNTER — Other Ambulatory Visit (INDEPENDENT_AMBULATORY_CARE_PROVIDER_SITE_OTHER): Payer: Medicare Other

## 2019-01-24 ENCOUNTER — Other Ambulatory Visit: Payer: Self-pay

## 2019-01-24 DIAGNOSIS — R350 Frequency of micturition: Secondary | ICD-10-CM | POA: Diagnosis not present

## 2019-01-24 LAB — POC URINALSYSI DIPSTICK (AUTOMATED)
Bilirubin, UA: NEGATIVE
Blood, UA: NEGATIVE
Glucose, UA: NEGATIVE
Ketones, UA: NEGATIVE
Leukocytes, UA: NEGATIVE
Nitrite, UA: NEGATIVE
Protein, UA: NEGATIVE
Spec Grav, UA: 1.015 (ref 1.010–1.025)
Urobilinogen, UA: 0.2 E.U./dL
pH, UA: 6.5 (ref 5.0–8.0)

## 2019-01-24 NOTE — Telephone Encounter (Signed)
Labs today 4/1 Phone visit tomorrow 4/2 with dr Diona Browner

## 2019-01-25 ENCOUNTER — Encounter: Payer: Self-pay | Admitting: Family Medicine

## 2019-01-25 ENCOUNTER — Other Ambulatory Visit: Payer: Self-pay | Admitting: Family Medicine

## 2019-01-25 ENCOUNTER — Ambulatory Visit (INDEPENDENT_AMBULATORY_CARE_PROVIDER_SITE_OTHER): Payer: Medicare Other | Admitting: Family Medicine

## 2019-01-25 VITALS — BP 140/85 | HR 69 | Ht 63.0 in | Wt 109.0 lb

## 2019-01-25 DIAGNOSIS — Z9189 Other specified personal risk factors, not elsewhere classified: Secondary | ICD-10-CM | POA: Diagnosis not present

## 2019-01-25 DIAGNOSIS — M545 Low back pain, unspecified: Secondary | ICD-10-CM | POA: Insufficient documentation

## 2019-01-25 DIAGNOSIS — Z85048 Personal history of other malignant neoplasm of rectum, rectosigmoid junction, and anus: Secondary | ICD-10-CM | POA: Diagnosis not present

## 2019-01-25 LAB — URINE CULTURE
MICRO NUMBER:: 368599
SPECIMEN QUALITY:: ADEQUATE

## 2019-01-25 MED ORDER — DICLOFENAC SODIUM 75 MG PO TBEC: 75.0000 mg | DELAYED_RELEASE_TABLET | Freq: Two times a day (BID) | ORAL | 0 refills | Status: DC

## 2019-01-25 MED ORDER — CYCLOBENZAPRINE HCL 10 MG PO TABS: 5.0000 mg | ORAL_TABLET | Freq: Every evening | ORAL | 0 refills | Status: DC | PRN

## 2019-01-25 NOTE — Telephone Encounter (Signed)
Pt called to verify when pt s appt is; phone visit on 01/25/19 at 10 AM. Pt voiced understanding and will be by the phone. FYI to Dr Diona Browner.

## 2019-01-25 NOTE — Progress Notes (Signed)
TELEHEALTH VISIT  Due to national recommendations of social distancing due to [name],  telehealth visit is felt to be most appropriate for this patient at this time.   Interactive audio and video telecommunications were attempted between this provider and patient, however failed, due to patient having technical difficulties OR patient did not have access to video capability.  We continued and completed visit with audio only.    I connected with [name] on 01/25/19 at 10:00 AM EDT by telephone and verified that I am speaking with the correct person using two identifiers.   I discussed the limitations, risks, security and privacy concerns of performing an evaluation and management service by telephone and the availability of in person appointments. I also discussed with the patient that there may be a patient responsible charge related to this service. The patient expressed understanding and agreed to proceed.  Patient location: Home Provider Location: [place] Participants: [name] and [name]   History of Present Illness:  85 year old female at high risk for osteoporosis ( white, thin, postmenopausal, refused DEXA in past), past remote rectal cancer s/p resection presents with intermittent  right low back pain ( in small of back) off and on  for 1.5 weeks.  Started when sitting on cough reading a book.. no injury , no fall. No change with moving, bedning Not really tender to palpation.  Last night was severe pain... trouble sleeping last night.. better some this morning.   No radiation down legs.   No weakness in legs or numbness.  No fever.   No rash, no blister, no hypersensitive.   No dysuria, she has noted urinary frequency, no incontinence.  No change in BMs.   She has tried OTC supplements without relief. She has tried ibuprofen 400 mg  3-4 times daily.. helps a lot.  Hx of recurrent UTI  COVID 19 screen No recent travel or known exposure to  [place] The patient denies respiratory symptoms of COVID 19 at this time.  The importance of social distancing was discussed today.   Review of Systems  Constitutional: Negative for chills and fever.  Respiratory: Negative for cough and shortness of breath.   Cardiovascular: Negative for chest pain and palpitations.  Gastrointestinal: Negative for blood in stool, constipation, nausea and vomiting.       Stable  Chronic diarrhea      Past Medical History:  Diagnosis Date  . Allergic rhinitis, cause unspecified   . Diarrhea   . Edema   . History of colon cancer    s/p surgery, chemotherapy  . Internal hemorrhoids without mention of complication   . Malignant neoplasm of rectum ([place])   . Osteoarthrosis, unspecified whether generalized or localized, unspecified site   . Osteoporosis, unspecified   . Other acute reactions to stress   . Other and unspecified hyperlipidemia   . Other screening mammogram   . Pain in limb   . Special screening for osteoporosis   . Ulceration of vulva, unspecified   . Unspecified essential hypertension     reports that she has never smoked. She has never used smokeless tobacco. She reports that she does not drink alcohol or use drugs.   Current Outpatient Medications:  .  chlorthalidone (HYGROTON) 25 MG tablet, Take 1 tablet (25 mg total) by mouth daily., Disp: 30 tablet, Rfl: 5 .  Cholecalciferol (VITAMIN D PO), Take 5,000 Units by mouth daily., Disp: , Rfl:  .  colestipol (COLESTID) 1  G tablet, Take 2 g by mouth 3 (three) times daily. , Disp: , Rfl:  .  diltiazem (CARDIZEM CD) 240 MG 24 hr capsule, Take 1 capsule (240 mg total) by mouth daily., Disp: 30 capsule, Rfl: 10 .  glucosamine-chondroitin 500-400 MG tablet, Take 2 tablets by mouth daily. , Disp: , Rfl:  .  Opium 10 MG/ML (1%) TINC, Please take 0.2-0.4 mL three times per day, Disp: , Rfl:  .  paregoric 2 MG/5ML solution, Take 5 mLs by mouth as needed for diarrhea or loose stools., Disp: , Rfl:     Observations/Objective: Blood pressure 140/85, pulse 69, height 5\' 3"  (1.6 m), weight 109 lb (49.4 kg).  Physical Exam Constitutional:      General: She is not in acute distress. Pulmonary:     Effort: Pulmonary effort is normal. No respiratory distress.  Neurological:     Mental Status: She is alert.  Psychiatric:        Mood and Affect: Mood normal.       Assessment and Plan See problem based charting   I discussed the assessment and treatment plan with the patient. The patient was provided an opportunity to ask questions and all were answered. The patient agreed with the plan and demonstrated an understanding of the instructions.   The patient was advised to call back or seek an in-person evaluation if the symptoms worsen or if the condition fails to improve as anticipated.  I provided 30 minutes of non-face-to-face time during this encounter.   Laurie Lofts, MD

## 2019-01-25 NOTE — Assessment & Plan Note (Signed)
Neg UA. Minimal urinary symptoms  Not clearly kidney stone  Given no blood on UA and no CVA tenderness or abd pain. Not clearly related to rectal cancer history as no abd pain and no change in bowels from baseline   Most likely MSK strain or OA in lumbar spine.  NO sign of sciatica.  Pt with possible osteoporosis but no red flags for fracture and per pt no TTP over vertebrae.  treat with  NSAID, muslce relaxant, heat and low back stretches.  If not improving .Marland Kitchen pt will call for follow up next week.. make phone visit if not better please.

## 2019-01-25 NOTE — Patient Instructions (Addendum)
Stop  Ibuprofen.  Start Diclofenac twice daily for pain and inflammation.  Can use muscle relaxant cyclobenzaprine at night  As needed. Apply heat. Start low back stretches.  Call if not improving in 4-5 days or sooner if abdominal pain or fever.  We will call you with urine culture final result.

## 2019-01-25 NOTE — Progress Notes (Signed)
AVS and herniated disc stretches mailed to patient as instructed by Dr. Diona Browner.

## 2019-02-07 ENCOUNTER — Telehealth: Payer: Self-pay | Admitting: Family Medicine

## 2019-02-07 NOTE — Telephone Encounter (Signed)
Copied from [place] [id number]. Topic: Quick Communication - Home Health Verbal Orders >> Feb 07, 2019  1:05 PM Scherrie Gerlach wrote: Caller/Agency: Cecilie Lowers // [place] Number: [phone] Requesting to continue PT Frequency: 2 wk / 4

## 2019-02-07 NOTE — Telephone Encounter (Signed)
Left message for Cecilie Lowers giving verbal order to continue PT 2 x a week for 4 weeks per Dr. Diona Browner.

## 2019-02-07 NOTE — Telephone Encounter (Signed)
Pt had appt on 01/25/19.

## 2019-02-07 NOTE — Telephone Encounter (Signed)
Okay to give verbal order as requested.

## 2019-02-28 ENCOUNTER — Ambulatory Visit: Payer: Medicare Other

## 2019-03-02 ENCOUNTER — Encounter: Payer: Medicare Other | Admitting: Family Medicine

## 2019-05-03 DIAGNOSIS — L239 Allergic contact dermatitis, unspecified cause: Secondary | ICD-10-CM | POA: Diagnosis not present

## 2019-05-03 DIAGNOSIS — L089 Local infection of the skin and subcutaneous tissue, unspecified: Secondary | ICD-10-CM | POA: Diagnosis not present

## 2019-05-03 DIAGNOSIS — T63461A Toxic effect of venom of wasps, accidental (unintentional), initial encounter: Secondary | ICD-10-CM | POA: Diagnosis not present

## 2019-05-22 ENCOUNTER — Other Ambulatory Visit: Payer: Self-pay | Admitting: Family Medicine

## 2019-06-18 ENCOUNTER — Telehealth: Payer: Self-pay | Admitting: Family Medicine

## 2019-06-18 ENCOUNTER — Ambulatory Visit (INDEPENDENT_AMBULATORY_CARE_PROVIDER_SITE_OTHER): Payer: Medicare Other

## 2019-06-18 ENCOUNTER — Ambulatory Visit: Payer: Medicare Other

## 2019-06-18 VITALS — Ht 65.0 in | Wt 110.0 lb

## 2019-06-18 DIAGNOSIS — Z0000 Encounter for general adult medical examination without abnormal findings: Secondary | ICD-10-CM | POA: Diagnosis not present

## 2019-06-18 DIAGNOSIS — E782 Mixed hyperlipidemia: Secondary | ICD-10-CM

## 2019-06-18 DIAGNOSIS — E559 Vitamin D deficiency, unspecified: Secondary | ICD-10-CM

## 2019-06-18 NOTE — Progress Notes (Signed)
PCP notes:  Health Maintenance:  No gaps  Abnormal Screenings:  None  Patient concerns:  Wants to get help for her knees.  Nurse concerns:  None  Next PCP appt.: 06/22/2019 at 3:20

## 2019-06-18 NOTE — Patient Instructions (Signed)
[name] , Thank you for taking time to come for your Medicare Wellness Visit. I appreciate your ongoing commitment to your health goals. Please review the following plan we discussed and let me know if I can assist you in the future.   Screening recommendations/referrals: Colonoscopy: not required Mammogram: not required Bone Density: postponed Recommended yearly ophthalmology/optometry visit for glaucoma screening and checkup Recommended yearly dental visit for hygiene and checkup  Vaccinations: Influenza vaccine: 09/2009 Pneumococcal vaccine: 11/2013 Tdap vaccine: postponed Shingles vaccine: discussed    Advanced directives: Please bring a copy of your POA (Power of [place]) and/or Living Will to your next appointment.    Conditions/risks identified: underweight  Next appointment: 06/22/2019 at 3:20   Preventive Care 26 Years and Older, Female Preventive care refers to lifestyle choices and visits with your health care provider that can promote health and wellness. What does preventive care include?  A yearly physical exam. This is also called an annual well check.  Dental exams once or twice a year.  Routine eye exams. Ask your health care provider how often you should have your eyes checked.  Personal lifestyle choices, including:  Daily care of your teeth and gums.  Regular physical activity.  Eating a healthy diet.  Avoiding tobacco and drug use.  Limiting alcohol use.  Practicing safe sex.  Taking low-dose aspirin every day.  Taking vitamin and mineral supplements as recommended by your health care provider. What happens during an annual well check? The services and screenings done by your health care provider during your annual well check will depend on your age, overall health, lifestyle risk factors, and family history of disease. Counseling  Your health care provider may ask you questions about your:  Alcohol use.  Tobacco use.  Drug use.   Emotional well-being.  Home and relationship well-being.  Sexual activity.  Eating habits.  History of falls.  Memory and ability to understand (cognition).  Work and work Statistician.  Reproductive health. Screening  You may have the following tests or measurements:  Height, weight, and BMI.  Blood pressure.  Lipid and cholesterol levels. These may be checked every 5 years, or more frequently if you are over 72 years old.  Skin check.  Lung cancer screening. You may have this screening every year starting at age 37 if you have a 30-pack-year history of smoking and currently smoke or have quit within the past 15 years.  Fecal occult blood test (FOBT) of the stool. You may have this test every year starting at age 61.  Flexible sigmoidoscopy or colonoscopy. You may have a sigmoidoscopy every 5 years or a colonoscopy every 10 years starting at age 37.  Hepatitis C blood test.  Hepatitis B blood test.  Sexually transmitted disease (STD) testing.  Diabetes screening. This is done by checking your blood sugar (glucose) after you have not eaten for a while (fasting). You may have this done every 1-3 years.  Bone density scan. This is done to screen for osteoporosis. You may have this done starting at age 88.  Mammogram. This may be done every 1-2 years. Talk to your health care provider about how often you should have regular mammograms. Talk with your health care provider about your test results, treatment options, and if necessary, the need for more tests. Vaccines  Your health care provider may recommend certain vaccines, such as:  Influenza vaccine. This is recommended every year.  Tetanus, diphtheria, and acellular pertussis (Tdap, Td) vaccine. You may need a  Td booster every 10 years.  Zoster vaccine. You may need this after age 40.  Pneumococcal 13-valent conjugate (PCV13) vaccine. One dose is recommended after age 29.  Pneumococcal polysaccharide (PPSV23)  vaccine. One dose is recommended after age 56. Talk to your health care provider about which screenings and vaccines you need and how often you need them. This information is not intended to replace advice given to you by your health care provider. Make sure you discuss any questions you have with your health care provider. Document Released: 11/07/2015 Document Revised: 06/30/2016 Document Reviewed: 08/12/2015 Elsevier Interactive Patient Education  2017 [place] Prevention in the Home Falls can cause injuries. They can happen to people of all ages. There are many things you can do to make your home safe and to help prevent falls. What can I do on the outside of my home?  Regularly fix the edges of walkways and driveways and fix any cracks.  Remove anything that might make you trip as you walk through a door, such as a raised step or threshold.  Trim any bushes or trees on the path to your home.  Use bright outdoor lighting.  Clear any walking paths of anything that might make someone trip, such as rocks or tools.  Regularly check to see if handrails are loose or broken. Make sure that both sides of any steps have handrails.  Any raised decks and porches should have guardrails on the edges.  Have any leaves, snow, or ice cleared regularly.  Use sand or salt on walking paths during winter.  Clean up any spills in your garage right away. This includes oil or grease spills. What can I do in the bathroom?  Use night lights.  Install grab bars by the toilet and in the tub and shower. Do not use towel bars as grab bars.  Use non-skid mats or decals in the tub or shower.  If you need to sit down in the shower, use a plastic, non-slip stool.  Keep the floor dry. Clean up any water that spills on the floor as soon as it happens.  Remove soap buildup in the tub or shower regularly.  Attach bath mats securely with double-sided non-slip rug tape.  Do not have throw rugs  and other things on the floor that can make you trip. What can I do in the bedroom?  Use night lights.  Make sure that you have a light by your bed that is easy to reach.  Do not use any sheets or blankets that are too big for your bed. They should not hang down onto the floor.  Have a firm chair that has side arms. You can use this for support while you get dressed.  Do not have throw rugs and other things on the floor that can make you trip. What can I do in the kitchen?  Clean up any spills right away.  Avoid walking on wet floors.  Keep items that you use a lot in easy-to-reach places.  If you need to reach something above you, use a strong step stool that has a grab bar.  Keep electrical cords out of the way.  Do not use floor polish or wax that makes floors slippery. If you must use wax, use non-skid floor wax.  Do not have throw rugs and other things on the floor that can make you trip. What can I do with my stairs?  Do not leave any items on the stairs.  Make sure that there are handrails on both sides of the stairs and use them. Fix handrails that are broken or loose. Make sure that handrails are as long as the stairways.  Check any carpeting to make sure that it is firmly attached to the stairs. Fix any carpet that is loose or worn.  Avoid having throw rugs at the top or bottom of the stairs. If you do have throw rugs, attach them to the floor with carpet tape.  Make sure that you have a light switch at the top of the stairs and the bottom of the stairs. If you do not have them, ask someone to add them for you. What else can I do to help prevent falls?  Wear shoes that:  Do not have high heels.  Have rubber bottoms.  Are comfortable and fit you well.  Are closed at the toe. Do not wear sandals.  If you use a stepladder:  Make sure that it is fully opened. Do not climb a closed stepladder.  Make sure that both sides of the stepladder are locked into  place.  Ask someone to hold it for you, if possible.  Clearly mark and make sure that you can see:  Any grab bars or handrails.  First and last steps.  Where the edge of each step is.  Use tools that help you move around (mobility aids) if they are needed. These include:  Canes.  Walkers.  Scooters.  Crutches.  Turn on the lights when you go into a dark area. Replace any light bulbs as soon as they burn out.  Set up your furniture so you have a clear path. Avoid moving your furniture around.  If any of your floors are uneven, fix them.  If there are any pets around you, be aware of where they are.  Review your medicines with your doctor. Some medicines can make you feel dizzy. This can increase your chance of falling. Ask your doctor what other things that you can do to help prevent falls. This information is not intended to replace advice given to you by your health care provider. Make sure you discuss any questions you have with your health care provider. Document Released: 08/07/2009 Document Revised: 03/18/2016 Document Reviewed: 11/15/2014 Elsevier Interactive Patient Education  2017 [employer].

## 2019-06-18 NOTE — Progress Notes (Signed)
Subjective:   [name] is a 85 y.o. female who presents for Medicare Annual (Subsequent) preventive examination.  This visit type was conducted due to national recommendations for restrictions regarding the COVID-19 Pandemic (e.g. social distancing). This format is felt to be most appropriate for this patient at this time. All issues noted in this document were discussed and addressed. No physical exam was performed (except for noted visual exam findings with Video Visits). This patient, [name], has given permission to perform this visit via telephone. Vital signs may be absent or patient reported.  Patient location:  At home  Nurse location:  At home     Review of Systems:  n/a Cardiac Risk Factors include: advanced age (>53men, >67 women);hypertension;dyslipidemia     Objective:     Vitals: Ht 5\' 5"  (1.651 m) Comment: per patient  Wt 110 lb (49.9 kg) Comment: per patient  BMI 18.30 kg/m   Body mass index is 18.3 kg/m.  Advanced Directives 06/18/2019 02/24/2018 01/18/2017 01/22/2016  Does Patient Have a Medical Advance Directive? Yes Yes Yes Yes  Type of Paramedic of [place];Living will Living will [place];Living will [place];Living will  Does patient want to make changes to medical advance directive? - - - No - Patient declined  Copy of [place] in Chart? No - copy requested - No - copy requested No - copy requested    Tobacco Social History   Tobacco Use  Smoking Status Never Smoker  Smokeless Tobacco Never Used     Counseling given: Not Answered   Clinical Intake:  Pre-visit preparation completed: Yes  Pain : No/denies pain     Nutritional Status: BMI <19  Underweight Nutritional Risks: None Diabetes: No  How often do you need to have someone help you when you read instructions, pamphlets, or other written materials from your doctor or pharmacy?: 1 - Never  What is the last grade level you completed in school?: some college  Interpreter Needed?: No  Information entered by :: NAllen LPN  Past Medical History:  Diagnosis Date  . Allergic rhinitis, cause unspecified   . Diarrhea   . Edema   . History of colon cancer    s/p surgery, chemotherapy  . Internal hemorrhoids without mention of complication   . Malignant neoplasm of rectum ([place])   . Osteoarthrosis, unspecified whether generalized or localized, unspecified site   . Osteoporosis, unspecified   . Other acute reactions to stress   . Other and unspecified hyperlipidemia   . Other screening mammogram   . Pain in limb   . Special screening for osteoporosis   . Ulceration of vulva, unspecified   . Unspecified essential hypertension    Past Surgical History:  Procedure Laterality Date  . dental implants    . PARTIAL COLECTOMY  9/10   temporary colostomy   Family History  Problem Relation Age of Onset  . Stroke Mother   . Arthritis Mother   . Arthritis Brother   . Depression Sister    Social History   Socioeconomic History  . Marital status: Married    Spouse name: Not on file  . Number of children: 3  . Years of education: Not on file  . Highest education level: Not on file  Occupational History  . Occupation: Teaching laboratory technician  . Financial resource strain: Not hard at all  . Food insecurity    Worry: Never true  Inability: Never true  . Transportation needs    Medical: No    Non-medical: No  Tobacco Use  . Smoking status: Never Smoker  . Smokeless tobacco: Never Used  Substance and Sexual Activity  . Alcohol use: No    Alcohol/week: 0.0 standard drinks  . Drug use: Yes    Types: Opium  . Sexual activity: Yes  Lifestyle  . Physical activity    Days per week: 0 days    Minutes per session: 0 min  . Stress: Not at all  Relationships  . Social Herbalist on phone: Not on file    Gets together: Not on file    Attends  religious service: Not on file    Active member of club or organization: Not on file    Attends meetings of clubs or organizations: Not on file    Relationship status: Not on file  Other Topics Concern  . Not on file  Social History Narrative  . Not on file    Outpatient Encounter Medications as of 06/18/2019  Medication Sig  . chlorthalidone (HYGROTON) 25 MG tablet Take 1 tablet (25 mg total) by mouth daily.  . Cholecalciferol (VITAMIN D PO) Take 5,000 Units by mouth daily.  . colestipol (COLESTID) 1 G tablet Take 2 g by mouth 3 (three) times daily.   Marland Kitchen diltiazem (CARDIZEM CD) 240 MG 24 hr capsule Take 1 capsule (240 mg total) by mouth daily.  Marland Kitchen glucosamine-chondroitin 500-400 MG tablet Take 2 tablets by mouth daily.   Marland Kitchen Opium 10 MG/ML (1%) TINC Please take 0.2-0.4 mL three times per day  . paregoric 2 MG/5ML solution Take 5 mLs by mouth as needed for diarrhea or loose stools.  . cyclobenzaprine (FLEXERIL) 10 MG tablet Take 0.5-1 tablets (5-10 mg total) by mouth at bedtime as needed for muscle spasms. (Patient not taking: Reported on 06/18/2019)  . diclofenac (VOLTAREN) 75 MG EC tablet Take 1 tablet (75 mg total) by mouth 2 (two) times daily. (Patient not taking: Reported on 06/18/2019)   No facility-administered encounter medications on file as of 06/18/2019.     Activities of Daily Living In your present state of health, do you have any difficulty performing the following activities: 06/18/2019  Hearing? N  Vision? N  Difficulty concentrating or making decisions? N  Walking or climbing stairs? N  Dressing or bathing? N  Doing errands, shopping? N  Preparing Food and eating ? N  Using the Toilet? N  In the past six months, have you accidently leaked urine? N  Do you have problems with loss of bowel control? N  Managing your Medications? N  Managing your Finances? N  Housekeeping or managing your Housekeeping? N  Some recent data might be hidden    Patient Care Team: Jinny Sanders, MD as PCP - General [place], Vinie Sill as Consulting Physician (Gastroenterology)    Assessment:   This is a routine wellness examination for [place].  Exercise Activities and Dietary recommendations Current Exercise Habits: The patient has a physically strenuous job, but has no regular exercise apart from work.  Goals    . Increase physical activity     Starting 01/18/2017, I will continue to walk at least 30 min 3-4 days per week.     . Increase physical activity     Starting 02/24/18, I will continue to walk at least 45 minutes daily.     . Patient Stated     06/18/2019, get some  help for knees       Fall Risk Fall Risk  06/18/2019 02/24/2018 01/18/2017 01/22/2016 01/24/2015  Falls in the past year? 0 Yes No No No  Comment - fell after tripping over cords - - -  Number falls in past yr: - 1 - - -  Injury with Fall? - Yes - - -  Risk for fall due to : Medication side effect - - - -  Follow up Falls evaluation completed;Falls prevention discussed - - - -   Is the patient's home free of loose throw rugs in walkways, pet beds, electrical cords, etc?   yes      Grab bars in the bathroom? no      Handrails on the stairs?   yes      Adequate lighting?   yes  Timed Get Up and Go performed: n/a  Depression Screen PHQ 2/9 Scores 06/18/2019 02/24/2018 01/18/2017 01/22/2016  PHQ - 2 Score 0 0 0 0  PHQ- 9 Score 0 0 - -     Cognitive Function MMSE - Mini Mental State Exam 06/18/2019 02/24/2018 01/18/2017 01/22/2016  Orientation to time 4 5 5 5   Orientation to Place 5 5 5 5   Registration 3 3 3 3   Attention/ Calculation 5 0 0 0  Recall 2 3 3 3   Language- name 2 objects 0 0 0 0  Language- repeat 1 1 1 1   Language- follow 3 step command 0 3 3 3   Language- read & follow direction 0 0 0 0  Write a sentence 0 0 0 0  Copy design 0 0 0 0  Total score 20 20 20 20    Mini Cog  Mini-Cog screen was completed. Maximum score is 22. A value of 0 denotes this part of the MMSE was not completed or the  patient failed this part of the Mini-Cog screening.       Immunization History  Administered Date(s) Administered  . Influenza Whole 09/25/2009  . Pneumococcal Conjugate-13 12/14/2013  . Pneumococcal Polysaccharide-23 10/25/2004  . Td 10/25/2004    Qualifies for Shingles Vaccine? yes  Screening Tests Health Maintenance  Topic Date Due  . INFLUENZA VACCINE  05/26/2019  . DEXA SCAN  12/24/2023 (Originally 04/03/2000)  . TETANUS/TDAP  10/24/2024 (Originally 10/25/2014)  . PNA vac Low Risk Adult  Completed    Cancer Screenings: Lung: Low Dose CT Chest recommended if Age 62-80 years, 30 pack-year currently smoking OR have quit w/in 15years. Patient does not qualify. Breast:  Up to date on Mammogram? Yes   Up to date of Bone Density/Dexa? Yes Colorectal: not required  Additional Screenings: : Hepatitis C Screening: n/a     Plan:    Patient would like to get help for knees.   I have personally reviewed and noted the following in the patient's chart:   . Medical and social history . Use of alcohol, tobacco or illicit drugs  . Current medications and supplements . Functional ability and status . Nutritional status . Physical activity . Advanced directives . List of other physicians . Hospitalizations, surgeries, and ER visits in previous 12 months . Vitals . Screenings to include cognitive, depression, and falls . Referrals and appointments  In addition, I have reviewed and discussed with patient certain preventive protocols, quality metrics, and best practice recommendations. A written personalized care plan for preventive services as well as general preventive health recommendations were provided to patient.     Kellie Simmering, LPN  [id number]

## 2019-06-18 NOTE — Telephone Encounter (Signed)
-----   Message from Ellamae Sia sent at 06/12/2019 10:05 AM EDT ----- Regarding: Lab orders for Tuesday, 8.25.20 Patient is scheduled for CPX labs, please order future labs, Thanks , Karna Christmas

## 2019-06-19 ENCOUNTER — Other Ambulatory Visit: Payer: Medicare Other

## 2019-06-22 ENCOUNTER — Encounter: Payer: Medicare Other | Admitting: Family Medicine

## 2019-06-27 ENCOUNTER — Other Ambulatory Visit: Payer: Self-pay

## 2019-06-27 ENCOUNTER — Encounter: Payer: Self-pay | Admitting: Family Medicine

## 2019-06-27 ENCOUNTER — Ambulatory Visit (INDEPENDENT_AMBULATORY_CARE_PROVIDER_SITE_OTHER): Payer: Medicare Other | Admitting: Family Medicine

## 2019-06-27 VITALS — BP 120/80 | HR 110 | Temp 98.4°F | Ht 63.0 in | Wt 108.5 lb

## 2019-06-27 DIAGNOSIS — M170 Bilateral primary osteoarthritis of knee: Secondary | ICD-10-CM | POA: Diagnosis not present

## 2019-06-27 MED ORDER — METHYLPREDNISOLONE ACETATE 40 MG/ML IJ SUSP
80.0000 mg | Freq: Once | INTRAMUSCULAR | Status: AC
  Administered 2019-06-27: 80 mg via INTRA_ARTICULAR

## 2019-06-27 NOTE — Progress Notes (Signed)
     [name] T. Laurie Baria, MD Primary Care and [place] at [facility] [place], [address] Phone: [phone]  FAX: [place] - 85 y.o. female  MRN [id number]  Date of Birth: Aug 16, 1933  Visit Date: 06/27/2019  PCP: Jinny Sanders, MD  Referred by: Jinny Sanders, MD  Chief Complaint  Patient presents with  . Knee Pain    Bilateral   Subjective:   [name] is a 85 y.o. very pleasant female patient with Body mass index is 19.22 kg/m. who presents with the following:  B knee pain: Saw me about 1 year ago. B knee injections at that time.  Knee pain and hurt when .   B knee injection, know B knee OA  Aspiration/Injection Procedure Note [name] 07-09-1933 Date of procedure: 06/27/2019  Procedure: Large Joint Joint Aspiration / Injection of the Right Knee Indications: Pain  Procedure Details Patient verbally consented to procedure. Risks (including potential rare risk of infection), benefits, and alternatives explained. Sterilely prepped with Chloraprep. Ethyl cholride used for anesthesia. 8 cc Lidocaine 1% mixed with 2 mL Depo-Medrol 40 mg injected using the anteromedial approach without difficulty. No complications with procedure and tolerated well. Patient had decreased pain post-injection.  Medication: 2 mL of Depo-Medrol 40 mg, equaling Depo-Medrol 80 mg total  Aspiration/Injection Procedure Note [name] 10-27-1932 Date of procedure: 06/27/2019  Procedure: Large Joint Aspiration / Injection of the Left Knee Indications: Pain  Procedure Details Patient verbally consented to procedure. Risks (including potential rare risk of infection), benefits, and alternatives explained. Sterilely prepped with Chloraprep. Ethyl cholride used for anesthesia. 8 cc Lidocaine 1% mixed with 2 mL Depo-Medrol 40 mg injected using the anteromedial approach without difficulty. No complications with  procedure and tolerated well. Patient had decreased pain post-injection.  Medication: 2 mL of Depo-Medrol 40 mg, equaling Depo-Medrol 80 mg total   Signed,  Akiel Fennell T. Viki Carrera, MD

## 2019-07-11 ENCOUNTER — Other Ambulatory Visit: Payer: Medicare Other

## 2019-07-12 ENCOUNTER — Telehealth: Payer: Self-pay

## 2019-07-12 NOTE — Telephone Encounter (Signed)
Pt said she has just returned for [place] and while on vacation wore a mask on and off because there are not a lot of people in [place]. Pt is having back pain and wants to see Dr Diona Browner in office. Pt already has a annual phone visit on 07/13/19 at 9:20. Butch Penny CMA said OK to switch to in office appt. Pt has no covid symptoms, pt traveled to [place] and no known exposure to + covid. Pt voiced understanding and will be at office at 9:10 to get checked in. FYI to Dr Diona Browner.

## 2019-07-12 NOTE — Telephone Encounter (Signed)
Noted  

## 2019-07-13 ENCOUNTER — Ambulatory Visit (INDEPENDENT_AMBULATORY_CARE_PROVIDER_SITE_OTHER): Payer: Medicare Other | Admitting: Family Medicine

## 2019-07-13 ENCOUNTER — Ambulatory Visit (INDEPENDENT_AMBULATORY_CARE_PROVIDER_SITE_OTHER)
Admission: RE | Admit: 2019-07-13 | Discharge: 2019-07-13 | Disposition: A | Discharge status: Home / Self Care | Payer: Medicare Other | Source: Ambulatory Visit | Attending: Family Medicine | Admitting: Family Medicine

## 2019-07-13 ENCOUNTER — Other Ambulatory Visit: Payer: Self-pay | Admitting: Family Medicine

## 2019-07-13 ENCOUNTER — Other Ambulatory Visit: Payer: Self-pay

## 2019-07-13 ENCOUNTER — Encounter: Payer: Self-pay | Admitting: Family Medicine

## 2019-07-13 VITALS — BP 140/86 | HR 94 | Temp 98.0°F | Ht 63.0 in | Wt 109.5 lb

## 2019-07-13 DIAGNOSIS — M25551 Pain in right hip: Secondary | ICD-10-CM

## 2019-07-13 DIAGNOSIS — K529 Noninfective gastroenteritis and colitis, unspecified: Secondary | ICD-10-CM | POA: Diagnosis not present

## 2019-07-13 DIAGNOSIS — M545 Low back pain, unspecified: Secondary | ICD-10-CM

## 2019-07-13 DIAGNOSIS — I10 Essential (primary) hypertension: Secondary | ICD-10-CM | POA: Diagnosis not present

## 2019-07-13 DIAGNOSIS — E559 Vitamin D deficiency, unspecified: Secondary | ICD-10-CM | POA: Diagnosis not present

## 2019-07-13 DIAGNOSIS — E782 Mixed hyperlipidemia: Secondary | ICD-10-CM | POA: Diagnosis not present

## 2019-07-13 DIAGNOSIS — Z0000 Encounter for general adult medical examination without abnormal findings: Secondary | ICD-10-CM

## 2019-07-13 MED ORDER — PREDNISONE 20 MG PO TABS: ORAL_TABLET | ORAL | 0 refills | Status: DC

## 2019-07-13 NOTE — Progress Notes (Signed)
Chief Complaint  Patient presents with  . Annual Exam    Part 2  . Back Pain    History of Present Illness: HPI  The patient presents for annual medicare wellness, complete physical and review of chronic health problems. He/She also has the following acute concerns today: back  pain  The patient saw a LPN or RN for medicare wellness visit.  Prevention and wellness was reviewed in detail. Note reviewed and important notes copied below.  Health Maintenance:  No gaps  Abnormal Screenings:  None  07/13/19   right low back pain: Hx of low back pain in 01/2019.Marland Kitchen resolved with NSAIDS, muscle relaxant heat. No X-ray performed.  Today she reports she has had intermittent pain since.  In last week pain has been severe.  She returned from [place]. Drove, different bed.  No fall or known injury.  No radiation of pain, no numbness  No weakness.  No rash. Ibuprofen 400 mg helped some temporarily.   No past back issues.  Hypertension:    Well controlled but in pain today. BP Readings from Last 3 Encounters:  07/13/19 140/86  06/27/19 120/80  01/25/19 140/85  Using medication without problems or lightheadedness:  Chest pain with exertion:none Edema:none Short of breath:none Average home BPs: Other issues:  Elevated Cholesterol:  Due for re-eval. Lab Results  Component Value Date   CHOL 165 02/24/2018   HDL 91.20 02/24/2018   LDLCALC 64 02/24/2018   LDLDIRECT 114.6 11/16/2006   TRIG 52.0 02/24/2018   CHOLHDL 2 02/24/2018  Using medications without problems: Muscle aches:  Diet compliance: moderate Exercise: minimal recently Other complaints:  Vitr D def: due for re-eval.   COVID 19 screen No recent travel or known exposure to [place] The patient denies respiratory symptoms of COVID 19 at this time.  The importance of social distancing was discussed today.   Review of Systems  Constitutional: Negative for chills and fever.  HENT: Negative for congestion and  ear pain.   Eyes: Negative for pain and redness.  Respiratory: Negative for cough and shortness of breath.   Cardiovascular: Negative for chest pain, palpitations and leg swelling.  Gastrointestinal: Negative for abdominal pain, blood in stool, constipation, diarrhea, nausea and vomiting.  Genitourinary: Negative for dysuria.  Musculoskeletal: Negative for falls and myalgias.  Skin: Negative for rash.  Neurological: Negative for dizziness.  Psychiatric/Behavioral: Negative for depression. The patient is not nervous/anxious.       Past Medical History:  Diagnosis Date  . Allergic rhinitis, cause unspecified   . Diarrhea   . Edema   . History of colon cancer    s/p surgery, chemotherapy  . Internal hemorrhoids without mention of complication   . Malignant neoplasm of rectum ([place])   . Osteoarthrosis, unspecified whether generalized or localized, unspecified site   . Osteoporosis, unspecified   . Other acute reactions to stress   . Other and unspecified hyperlipidemia   . Other screening mammogram   . Pain in limb   . Special screening for osteoporosis   . Ulceration of vulva, unspecified   . Unspecified essential hypertension     reports that she has never smoked. She has never used smokeless tobacco. She reports current drug use. Drug: Opium. She reports that she does not drink alcohol.   Current Outpatient Medications:  .  chlorthalidone (HYGROTON) 25 MG tablet, Take 1 tablet (25 mg total) by mouth daily., Disp: 30 tablet, Rfl: 2 .  Cholecalciferol (VITAMIN D PO), Take 5,000 Units  by mouth daily., Disp: , Rfl:  .  colestipol (COLESTID) 1 G tablet, Take 2 g by mouth 3 (three) times daily. , Disp: , Rfl:  .  cyclobenzaprine (FLEXERIL) 10 MG tablet, Take 0.5-1 tablets (5-10 mg total) by mouth at bedtime as needed for muscle spasms., Disp: 15 tablet, Rfl: 0 .  diclofenac (VOLTAREN) 75 MG EC tablet, Take 1 tablet (75 mg total) by mouth 2 (two) times daily., Disp: 30 tablet, Rfl: 0 .   diltiazem (CARDIZEM CD) 240 MG 24 hr capsule, Take 1 capsule (240 mg total) by mouth daily., Disp: 30 capsule, Rfl: 0 .  glucosamine-chondroitin 500-400 MG tablet, Take 2 tablets by mouth daily. , Disp: , Rfl:  .  Opium 10 MG/ML (1%) TINC, Please take 0.2-0.4 mL three times per day, Disp: , Rfl:  .  paregoric 2 MG/5ML solution, Take 5 mLs by mouth as needed for diarrhea or loose stools., Disp: , Rfl:    Observations/Objective: Blood pressure 140/86, pulse 94, temperature 98 F (36.7 C), temperature source Temporal, height 5\' 3"  (1.6 m), weight 109 lb 8 oz (49.7 kg), SpO2 96 %.  Physical Exam Constitutional:      General: She is not in acute distress.    Appearance: Normal appearance. She is well-developed. She is not ill-appearing or toxic-appearing.  HENT:     Head: Normocephalic.     Right Ear: Hearing, tympanic membrane, ear canal and external ear normal.     Left Ear: Hearing, tympanic membrane, ear canal and external ear normal.     Nose: Nose normal.  Eyes:     General: Lids are normal. Lids are everted, no foreign bodies appreciated.     Conjunctiva/sclera: Conjunctivae normal.     Pupils: Pupils are equal, round, and reactive to light.  Neck:     Musculoskeletal: Normal range of motion and neck supple.     Thyroid: No thyroid mass or thyromegaly.     Vascular: No carotid bruit.     Trachea: Trachea normal.  Cardiovascular:     Rate and Rhythm: Normal rate and regular rhythm.     Heart sounds: Normal heart sounds, S1 normal and S2 normal. No murmur. No gallop.   Pulmonary:     Effort: Pulmonary effort is normal. No respiratory distress.     Breath sounds: Normal breath sounds. No wheezing, rhonchi or rales.  Abdominal:     General: Bowel sounds are normal. There is no distension or abdominal bruit.     Palpations: Abdomen is soft. There is no fluid wave or mass.     Tenderness: There is no abdominal tenderness. There is no guarding or rebound.     Hernia: No hernia is  present.  Musculoskeletal:     Lumbar back: She exhibits tenderness. She exhibits normal range of motion and no bony tenderness.     Comments: ttp in right sciatic notch, no ttp over lateral bursa.  no anteriro hip pain but ttp over bone posterior ( she is very thin and I am able to palpate posterior head of femur  Lymphadenopathy:     Cervical: No cervical adenopathy.  Skin:    General: Skin is warm and dry.     Findings: No rash.  Neurological:     Mental Status: She is alert.     Cranial Nerves: No cranial nerve deficit.     Sensory: No sensory deficit.  Psychiatric:        Mood and Affect: Mood is not anxious  or depressed.        Speech: Speech normal.        Behavior: Behavior normal. Behavior is cooperative.        Judgment: Judgment normal.      Assessment and Plan The patient's preventative maintenance and recommended screening tests for an annual wellness exam were reviewed in full today. Brought up to date unless services declined.  Counselled on the importance of diet, exercise, and its role in overall health and mortality. The patient's FH and SH was reviewed, including their home life, tobacco status, and drug and alcohol status.   Vaccines:declined Tdap , plans later on. Pap/DVE:not indicated Mammo:not indicated Bone Density:osteoporosis... Refuses dexa, refuses treatment other than CA, vit D and weight bearing exercise. Reviewed 2020 and pt continues to refuse. Colon:per GI. Saw last in 11/2018, paregoric Smoking Status:none ETOH/ drug [id number]     Eliezer Lofts, MD

## 2019-07-13 NOTE — Assessment & Plan Note (Signed)
Due to past surgery and radiation. Stable.

## 2019-07-13 NOTE — Assessment & Plan Note (Signed)
Tolerable control for age on chlorthalidone and diltiazem.

## 2019-07-13 NOTE — Assessment & Plan Note (Signed)
Due for re-eval. 

## 2019-07-13 NOTE — Assessment & Plan Note (Signed)
Recurrent pain.. move forward with X-ray... given neg SLR and focal ttp over posterior hip joint.. will X-ray hip as well.

## 2019-07-16 ENCOUNTER — Other Ambulatory Visit: Payer: Self-pay | Admitting: Family Medicine

## 2019-07-18 ENCOUNTER — Other Ambulatory Visit: Payer: Self-pay | Admitting: Family Medicine

## 2019-07-20 ENCOUNTER — Other Ambulatory Visit (INDEPENDENT_AMBULATORY_CARE_PROVIDER_SITE_OTHER): Payer: Medicare Other

## 2019-07-20 ENCOUNTER — Other Ambulatory Visit: Payer: Self-pay

## 2019-07-20 DIAGNOSIS — E782 Mixed hyperlipidemia: Secondary | ICD-10-CM | POA: Diagnosis not present

## 2019-07-20 DIAGNOSIS — E559 Vitamin D deficiency, unspecified: Secondary | ICD-10-CM

## 2019-07-20 LAB — VITAMIN D 25 HYDROXY (VIT D DEFICIENCY, FRACTURES): VITD: 19.74 ng/mL — ABNORMAL LOW (ref 30.00–100.00)

## 2019-07-20 LAB — LIPID PANEL
Cholesterol: 147 mg/dL (ref 0–200)
HDL: 84.6 mg/dL (ref >39.00)
LDL Cholesterol: 41 mg/dL (ref 0–99)
NonHDL: 61.96
Total CHOL/HDL Ratio: 2
Triglycerides: 107 mg/dL (ref 0.0–149.0)
VLDL: 21.4 mg/dL (ref 0.0–40.0)

## 2019-07-20 LAB — COMPREHENSIVE METABOLIC PANEL
ALT: 17 U/L (ref 0–35)
AST: 11 U/L (ref 0–37)
Albumin: 3.6 g/dL (ref 3.5–5.2)
Alkaline Phosphatase: 54 U/L (ref 39–117)
BUN: 32 mg/dL — ABNORMAL HIGH (ref 6–23)
CO2: 35 mEq/L — ABNORMAL HIGH (ref 19–32)
Calcium: 10.3 mg/dL (ref 8.4–10.5)
Chloride: 95 mEq/L — ABNORMAL LOW (ref 96–112)
Creatinine, Ser: 0.78 mg/dL (ref 0.40–1.20)
GFR: 70.31 mL/min (ref >60.00)
Glucose, Bld: 107 mg/dL — ABNORMAL HIGH (ref 70–99)
Potassium: 3.9 mEq/L (ref 3.5–5.1)
Sodium: 139 mEq/L (ref 135–145)
Total Bilirubin: 0.9 mg/dL (ref 0.2–1.2)
Total Protein: 5.7 g/dL — ABNORMAL LOW (ref 6.0–8.3)

## 2019-07-20 NOTE — Progress Notes (Signed)
No critical labs need to be addressed urgently. We will discuss labs in detail at upcoming office visit.   

## 2019-10-23 ENCOUNTER — Other Ambulatory Visit: Payer: Self-pay

## 2019-10-23 ENCOUNTER — Ambulatory Visit (INDEPENDENT_AMBULATORY_CARE_PROVIDER_SITE_OTHER): Payer: Medicare Other | Admitting: Family Medicine

## 2019-10-23 ENCOUNTER — Ambulatory Visit: Payer: Medicare Other | Admitting: Family Medicine

## 2019-10-23 ENCOUNTER — Encounter: Payer: Self-pay | Admitting: Family Medicine

## 2019-10-23 DIAGNOSIS — M170 Bilateral primary osteoarthritis of knee: Secondary | ICD-10-CM

## 2019-10-23 NOTE — Patient Instructions (Signed)
Start tylenol 1000 mg three times daily.  Continue diclofenac or voltaren gel three times daily topically.

## 2019-10-23 NOTE — Assessment & Plan Note (Addendum)
No sign of new injury.  Pt requests steroid injection in bilateral knees.Marland Kitchen good benefit from this in past. Last injections. 3 months ago.  will schedule with Dr. Lorelei Pont.  In meanwhile... use diclofenac topically and tylenol for pain regularly.

## 2019-10-23 NOTE — Progress Notes (Signed)
Chief Complaint  Patient presents with  . Knee Pain    Bilateral    History of Present Illness: HPI   85 year old female patient present with chronic knee pain due to osteoarthritis ( tricompartmental osteoarthritis).  Bilateral knee pain ongoing x 2-3 months.  no knew falls, no change in activity.   She has had knee injections in past.  Saw Dr. Lorelei Pont  06/2019.Marland Kitchen injected bilateral knees in 06/2019.Marland Kitchen  Helped dramatically.  She wishes to have repeat injection.    She has tried emu cream, possible voltaren gel ( she is not sure) Minimal relief.  She is not taking any oral medicaiton for this issue..    This visit occurred during the SARS-CoV-2 public health emergency.  Safety protocols were in place, including screening questions prior to the visit, additional usage of staff PPE, and extensive cleaning of exam room while observing appropriate contact time as indicated for disinfecting solutions.   COVID 19 screen:  No recent travel or known exposure to COVID19 The patient denies respiratory symptoms of COVID 19 at this time. The importance of social distancing was discussed today.     Review of Systems  Constitutional: Negative for chills and fever.  HENT: Negative for congestion and ear pain.   Eyes: Negative for pain and redness.  Respiratory: Negative for cough and shortness of breath.   Cardiovascular: Negative for chest pain, palpitations and leg swelling.  Gastrointestinal: Negative for abdominal pain, blood in stool, constipation, diarrhea, nausea and vomiting.  Genitourinary: Negative for dysuria.  Musculoskeletal: Negative for falls and myalgias.  Skin: Negative for rash.  Neurological: Negative for dizziness.  Psychiatric/Behavioral: Negative for depression. The patient is not nervous/anxious.       Past Medical History:  Diagnosis Date  . Allergic rhinitis, cause unspecified   . Diarrhea   . Edema   . History of colon cancer    s/p surgery, chemotherapy  .  Internal hemorrhoids without mention of complication   . Malignant neoplasm of rectum ([place])   . Osteoarthrosis, unspecified whether generalized or localized, unspecified site   . Osteoporosis, unspecified   . Other acute reactions to stress   . Other and unspecified hyperlipidemia   . Other screening mammogram   . Pain in limb   . Special screening for osteoporosis   . Ulceration of vulva, unspecified   . Unspecified essential hypertension     reports that she has never smoked. She has never used smokeless tobacco. She reports current drug use. Drug: Opium. She reports that she does not drink alcohol.   Current Outpatient Medications:  .  chlorthalidone (HYGROTON) 25 MG tablet, Take 1 tablet (25 mg total) by mouth daily., Disp: 30 tablet, Rfl: 5 .  Cholecalciferol (VITAMIN D PO), Take 5,000 Units by mouth daily., Disp: , Rfl:  .  colestipol (COLESTID) 1 G tablet, Take 2 g by mouth 3 (three) times daily. , Disp: , Rfl:  .  cyclobenzaprine (FLEXERIL) 10 MG tablet, Take 0.5-1 tablets (5-10 mg total) by mouth at bedtime as needed for muscle spasms., Disp: 15 tablet, Rfl: 0 .  diclofenac (VOLTAREN) 75 MG EC tablet, Take 1 tablet (75 mg total) by mouth 2 (two) times daily., Disp: 30 tablet, Rfl: 0 .  diltiazem (CARDIZEM CD) 240 MG 24 hr capsule, Take 1 capsule (240 mg total) by mouth daily., Disp: 90 capsule, Rfl: 3 .  glucosamine-chondroitin 500-400 MG tablet, Take 2 tablets by mouth daily. , Disp: , Rfl:  .  Opium  10 MG/ML (1%) TINC, Please take 0.2-0.4 mL three times per day, Disp: , Rfl:  .  paregoric 2 MG/5ML solution, Take 5 mLs by mouth as needed for diarrhea or loose stools., Disp: , Rfl:  .  predniSONE (DELTASONE) 20 MG tablet, 3 tabs by mouth daily x 3 days, then 2 tabs by mouth daily x 2 days then 1 tab by mouth daily x 2 days, Disp: 15 tablet, Rfl: 0   Observations/Objective: Blood pressure (!) 160/96, pulse 92, temperature 97.6 F (36.4 C), temperature source Temporal, height 5\' 3"   (1.6 m), weight 110 lb (49.9 kg), SpO2 98 %.  Physical Exam Constitutional:      General: She is not in acute distress.    Appearance: Normal appearance. She is well-developed. She is not ill-appearing or toxic-appearing.  HENT:     Head: Normocephalic.     Right Ear: Hearing, tympanic membrane, ear canal and external ear normal. Tympanic membrane is not erythematous, retracted or bulging.     Left Ear: Hearing, tympanic membrane, ear canal and external ear normal. Tympanic membrane is not erythematous, retracted or bulging.     Nose: No mucosal edema or rhinorrhea.     Right Sinus: No maxillary sinus tenderness or frontal sinus tenderness.     Left Sinus: No maxillary sinus tenderness or frontal sinus tenderness.     Mouth/Throat:     Pharynx: Uvula midline.  Eyes:     General: Lids are normal. Lids are everted, no foreign bodies appreciated.     Conjunctiva/sclera: Conjunctivae normal.     Pupils: Pupils are equal, round, and reactive to light.  Neck:     Thyroid: No thyroid mass or thyromegaly.     Vascular: No carotid bruit.     Trachea: Trachea normal.  Cardiovascular:     Rate and Rhythm: Normal rate and regular rhythm.     Pulses: Normal pulses.     Heart sounds: Normal heart sounds, S1 normal and S2 normal. No murmur. No friction rub. No gallop.   Pulmonary:     Effort: Pulmonary effort is normal. No tachypnea or respiratory distress.     Breath sounds: Normal breath sounds. No decreased breath sounds, wheezing, rhonchi or rales.  Abdominal:     General: Bowel sounds are normal.     Palpations: Abdomen is soft.     Tenderness: There is no abdominal tenderness.  Musculoskeletal:     Cervical back: Normal range of motion and neck supple.     Right knee: Deformity present. No swelling, effusion, erythema, bony tenderness or crepitus. Normal range of motion. Normal alignment, normal meniscus and normal patellar mobility.     Instability Tests: Negative medial McMurray test  and negative lateral McMurray test.     Left knee: Deformity present. No swelling, effusion, erythema, bony tenderness or crepitus. Normal range of motion. Normal alignment, normal meniscus and normal patellar mobility.     Instability Tests: Negative medial McMurray test and negative lateral McMurray test.     Comments:  Pulses 2 plus  Skin:    General: Skin is warm and dry.     Findings: No rash.  Neurological:     Mental Status: She is alert.  Psychiatric:        Mood and Affect: Mood is not anxious or depressed.        Speech: Speech normal.        Behavior: Behavior normal. Behavior is cooperative.        Thought  Content: Thought content normal.        Judgment: Judgment normal.      Assessment and Plan Osteoarthritis of knees, bilateral  No sign of new injury.  Pt requests steroid injection in bilateral knees.Marland Kitchen good benefit from this in past. Last injections. 3 months ago.  will schedule with Dr. Lorelei Pont.  In meanwhile... use diclofenac topically and tylenol for pain regularly.       Eliezer Lofts, MD

## 2019-10-28 NOTE — Progress Notes (Signed)
Laurie Gover T. Laurie Wiley, MD Primary Care and [place] at [facility] [place], [address] Phone: [phone]  FAX: [place] - 86 y.o. female  MRN [id number]  Date of Birth: 1933/03/03  Visit Date: 10/29/2019  PCP: Jinny Sanders, MD  Referred by: Jinny Sanders, MD  Chief Complaint  Patient presents with  . Knee Pain    Bilateral knee injections    This visit occurred during the SARS-CoV-2 public health emergency.  Safety protocols were in place, including screening questions prior to the visit, additional usage of staff PPE, and extensive cleaning of exam room while observing appropriate contact time as indicated for disinfecting solutions.   Subjective:   [name] is a 86 y.o. very pleasant female patient with Body mass index is 19.53 kg/m. who presents with the following:  She is a real pleasant lady, and I saw her in September 2020.  She has some significant bilateral osteoarthritis of the knee.  She has tricompartmental osteoarthritis with the worst in the medial compartment.  She had an excellent response to corticosteroid injection in September 2020.  She is having some flareup now.  She has tried a number of topical medications including emu cream, Voltaren gel.  None of these really helped at all.  This is a procedure only visit.  My partner Dr. Diona Browner asked me to evaluate the patient and she has had excellent response in the past with a 7-month release of symptoms for bilateral intra-articular injection with corticosteroid.    ICD-10-CM   1. Primary osteoarthritis of both knees  M17.0 methylPREDNISolone acetate (DEPO-MEDROL) injection 80 mg    methylPREDNISolone acetate (DEPO-MEDROL) injection 80 mg   Aspiration/Injection Procedure Note [name] Apr 23, 1933 Date of procedure: 10/29/2019  Procedure: Large Joint Joint Aspiration / Injection of the Right Knee Indications:  Pain  Procedure Details Patient verbally consented to procedure. Risks (including potential rare risk of infection), benefits, and alternatives explained. Sterilely prepped with Chloraprep. Ethyl cholride used for anesthesia. 8 cc Lidocaine 1% mixed with 2 mL Depo-Medrol 40 mg injected using the anteromedial approach without difficulty. No complications with procedure and tolerated well. Patient had decreased pain post-injection.  Medication: 2 mL of Depo-Medrol 40 mg, equaling Depo-Medrol 80 mg total  Aspiration/Injection Procedure Note [name] 23-Sep-1933 Date of procedure: 10/29/2019  Procedure: Large Joint Aspiration / Injection of the Left Knee Indications: Pain  Procedure Details Patient verbally consented to procedure. Risks (including potential rare risk of infection), benefits, and alternatives explained. Sterilely prepped with Chloraprep. Ethyl cholride used for anesthesia. 8 cc Lidocaine 1% mixed with 2 mL Depo-Medrol 40 mg injected using the anteromedial approach without difficulty. No complications with procedure and tolerated well. Patient had decreased pain post-injection.  Medication: 2 mL of Depo-Medrol 40 mg, equaling Depo-Medrol 80 mg total  Follow-up: No follow-ups on file.  Meds ordered this encounter  Medications  . methylPREDNISolone acetate (DEPO-MEDROL) injection 80 mg  . methylPREDNISolone acetate (DEPO-MEDROL) injection 80 mg   No orders of the defined types were placed in this encounter.   Signed,  Maud Deed. Ryland Smoots, MD   Outpatient Encounter Medications as of 10/29/2019  Medication Sig  . chlorthalidone (HYGROTON) 25 MG tablet Take 1 tablet (25 mg total) by mouth daily.  . Cholecalciferol (VITAMIN D PO) Take 5,000 Units by mouth daily.  . colestipol (COLESTID) 1 G tablet Take 2 g by mouth 3 (three) times daily.   Marland Kitchen  cyclobenzaprine (FLEXERIL) 10 MG tablet Take 0.5-1 tablets (5-10 mg total) by mouth at bedtime as needed for muscle spasms.  Marland Kitchen  diltiazem (CARDIZEM CD) 240 MG 24 hr capsule Take 1 capsule (240 mg total) by mouth daily.  Marland Kitchen glucosamine-chondroitin 500-400 MG tablet Take 2 tablets by mouth daily.   Marland Kitchen Opium 10 MG/ML (1%) TINC Please take 0.2-0.4 mL three times per day  . paregoric 2 MG/5ML solution Take 5 mLs by mouth as needed for diarrhea or loose stools.  . [EXPIRED] methylPREDNISolone acetate (DEPO-MEDROL) injection 80 mg   . [EXPIRED] methylPREDNISolone acetate (DEPO-MEDROL) injection 80 mg    No facility-administered encounter medications on file as of 10/29/2019.

## 2019-10-29 ENCOUNTER — Other Ambulatory Visit: Payer: Self-pay

## 2019-10-29 ENCOUNTER — Ambulatory Visit (INDEPENDENT_AMBULATORY_CARE_PROVIDER_SITE_OTHER): Payer: Medicare Other | Admitting: Family Medicine

## 2019-10-29 ENCOUNTER — Encounter: Payer: Self-pay | Admitting: Family Medicine

## 2019-10-29 VITALS — BP 130/80 | HR 85 | Temp 97.2°F | Ht 63.0 in | Wt 110.2 lb

## 2019-10-29 DIAGNOSIS — M170 Bilateral primary osteoarthritis of knee: Secondary | ICD-10-CM | POA: Diagnosis not present

## 2019-10-29 MED ORDER — METHYLPREDNISOLONE ACETATE 40 MG/ML IJ SUSP
80.0000 mg | Freq: Once | INTRAMUSCULAR | Status: AC
  Administered 2019-10-29: 80 mg via INTRA_ARTICULAR

## 2019-10-29 MED ORDER — METHYLPREDNISOLONE ACETATE 40 MG/ML IJ SUSP
80.0000 mg | Freq: Once | INTRAMUSCULAR | Status: AC
  Administered 2019-10-29: 11:00:00 80 mg via INTRA_ARTICULAR

## 2019-12-18 DIAGNOSIS — K529 Noninfective gastroenteritis and colitis, unspecified: Secondary | ICD-10-CM | POA: Diagnosis not present

## 2020-03-03 ENCOUNTER — Ambulatory Visit (INDEPENDENT_AMBULATORY_CARE_PROVIDER_SITE_OTHER): Payer: Medicare Other | Admitting: Family Medicine

## 2020-03-03 ENCOUNTER — Other Ambulatory Visit: Payer: Self-pay

## 2020-03-03 ENCOUNTER — Encounter: Payer: Self-pay | Admitting: Family Medicine

## 2020-03-03 VITALS — BP 130/84 | HR 80 | Temp 98.2°F | Ht 63.0 in | Wt 107.2 lb

## 2020-03-03 DIAGNOSIS — M170 Bilateral primary osteoarthritis of knee: Secondary | ICD-10-CM | POA: Diagnosis not present

## 2020-03-03 MED ORDER — METHYLPREDNISOLONE ACETATE 40 MG/ML IJ SUSP
80.0000 mg | Freq: Once | INTRAMUSCULAR | Status: AC
  Administered 2020-03-03: 80 mg via INTRA_ARTICULAR

## 2020-03-03 NOTE — Addendum Note (Signed)
Addended by: Emelia Salisbury C on: 03/03/2020 05:10 PM   Modules accepted: Orders

## 2020-03-03 NOTE — Progress Notes (Signed)
    Tajanay Hurley T. Fiorela Pelzer, MD, [employer] at [facility] [place], [address]  Phone: [phone]  FAX: [place] - 86 y.o. female  MRN [id number]  Date of Birth: Oct 10, 1933  Date: 03/03/2020  PCP: Jinny Sanders, MD  Referral: Jinny Sanders, MD  Chief Complaint  Patient presents with  . Knee Pain    both    This visit occurred during the SARS-CoV-2 public health emergency.  Safety protocols were in place, including screening questions prior to the visit, additional usage of staff PPE, and extensive cleaning of exam room while observing appropriate contact time as indicated for disinfecting solutions.   Subjective:   [name] is a 86 y.o. very pleasant female patient with Body mass index is 19 kg/m. who presents with the following:  She has known bilateral degenerative joint disease.  Procedure only    ICD-10-CM   1. Primary osteoarthritis of knees, bilateral  M17.0    Aspiration/Injection Procedure Note DIANI LITTLEPAGE 1933-04-30 Date of procedure: 03/03/2020  Procedure: Large Joint Joint Aspiration / Injection of the Right Knee Indications: Pain  Procedure Details Patient verbally consented to procedure. Risks (including potential rare risk of infection), benefits, and alternatives explained. Sterilely prepped with Chloraprep. Ethyl cholride used for anesthesia. 8 cc Lidocaine 1% mixed with 2 mL Depo-Medrol 40 mg injected using the anteromedial approach without difficulty. No complications with procedure and tolerated well. Patient had decreased pain post-injection.  Medication: 2 mL of Depo-Medrol 40 mg, equaling Depo-Medrol 80 mg total  Aspiration/Injection Procedure Note ZORIAH MCSORLEY 01/27/1933 Date of procedure: 03/03/2020  Procedure: Large Joint Aspiration / Injection of the Left Knee Indications: Pain  Procedure Details Patient verbally  consented to procedure. Risks (including potential rare risk of infection), benefits, and alternatives explained. Sterilely prepped with Chloraprep. Ethyl cholride used for anesthesia. 8 cc Lidocaine 1% mixed with 2 mL Depo-Medrol 40 mg injected using the anteromedial approach without difficulty. No complications with procedure and tolerated well. Patient had decreased pain post-injection.  Medication: 2 mL of Depo-Medrol 40 mg, equaling Depo-Medrol 80 mg total  Signed,  Deshanti Adcox T. Sehar Sedano, MD

## 2020-07-08 ENCOUNTER — Ambulatory Visit (INDEPENDENT_AMBULATORY_CARE_PROVIDER_SITE_OTHER): Payer: Medicare Other

## 2020-07-08 ENCOUNTER — Other Ambulatory Visit: Payer: Self-pay

## 2020-07-08 DIAGNOSIS — Z0000 Encounter for general adult medical examination without abnormal findings: Secondary | ICD-10-CM | POA: Diagnosis not present

## 2020-07-08 NOTE — Patient Instructions (Signed)
Ms. [name] , Thank you for taking time to come for your Medicare Wellness Visit. I appreciate your ongoing commitment to your health goals. Please review the following plan we discussed and let me know if I can assist you in the future.   Screening recommendations/referrals: Colonoscopy: no longer required Mammogram: no longer required Bone Density: declined Recommended yearly ophthalmology/optometry visit for glaucoma screening and checkup Recommended yearly dental visit for hygiene and checkup  Vaccinations: Influenza vaccine: due, will get at upcoming office visit  Pneumococcal vaccine: Completed series Tdap vaccine: decline/insurance Shingles vaccine: due, check with your insurance regarding coverage    Covid-19:Completed series  Advanced directives: Please bring a copy of your POA (Power of Attorney) and/or Living Will to your next appointment.   Conditions/risks identified: hypertension, hyperlipidemia  Next appointment: Follow up in one year for your annual wellness visit    Preventive Care 26 Years and Older, Female Preventive care refers to lifestyle choices and visits with your health care provider that can promote health and wellness. What does preventive care include?  A yearly physical exam. This is also called an annual well check.  Dental exams once or twice a year.  Routine eye exams. Ask your health care provider how often you should have your eyes checked.  Personal lifestyle choices, including:  Daily care of your teeth and gums.  Regular physical activity.  Eating a healthy diet.  Avoiding tobacco and drug use.  Limiting alcohol use.  Practicing safe sex.  Taking low-dose aspirin every day.  Taking vitamin and mineral supplements as recommended by your health care provider. What happens during an annual well check? The services and screenings done by your health care provider during your annual well check will depend on your age, overall health,  lifestyle risk factors, and family history of disease. Counseling  Your health care provider may ask you questions about your:  Alcohol use.  Tobacco use.  Drug use.  Emotional well-being.  Home and relationship well-being.  Sexual activity.  Eating habits.  History of falls.  Memory and ability to understand (cognition).  Work and work Statistician.  Reproductive health. Screening  You may have the following tests or measurements:  Height, weight, and BMI.  Blood pressure.  Lipid and cholesterol levels. These may be checked every 5 years, or more frequently if you are over 84 years old.  Skin check.  Lung cancer screening. You may have this screening every year starting at age 56 if you have a 30-pack-year history of smoking and currently smoke or have quit within the past 15 years.  Fecal occult blood test (FOBT) of the stool. You may have this test every year starting at age 81.  Flexible sigmoidoscopy or colonoscopy. You may have a sigmoidoscopy every 5 years or a colonoscopy every 10 years starting at age 1.  Hepatitis C blood test.  Hepatitis B blood test.  Sexually transmitted disease (STD) testing.  Diabetes screening. This is done by checking your blood sugar (glucose) after you have not eaten for a while (fasting). You may have this done every 1-3 years.  Bone density scan. This is done to screen for osteoporosis. You may have this done starting at age 17.  Mammogram. This may be done every 1-2 years. Talk to your health care provider about how often you should have regular mammograms. Talk with your health care provider about your test results, treatment options, and if necessary, the need for more tests. Vaccines  Your health care provider  may recommend certain vaccines, such as:  Influenza vaccine. This is recommended every year.  Tetanus, diphtheria, and acellular pertussis (Tdap, Td) vaccine. You may need a Td booster every 10 years.  Zoster  vaccine. You may need this after age 57.  Pneumococcal 13-valent conjugate (PCV13) vaccine. One dose is recommended after age 55.  Pneumococcal polysaccharide (PPSV23) vaccine. One dose is recommended after age 56. Talk to your health care provider about which screenings and vaccines you need and how often you need them. This information is not intended to replace advice given to you by your health care provider. Make sure you discuss any questions you have with your health care provider. Document Released: 11/07/2015 Document Revised: 06/30/2016 Document Reviewed: 08/12/2015 Elsevier Interactive Patient Education  2017 [place] Prevention in the Home Falls can cause injuries. They can happen to people of all ages. There are many things you can do to make your home safe and to help prevent falls. What can I do on the outside of my home?  Regularly fix the edges of walkways and driveways and fix any cracks.  Remove anything that might make you trip as you walk through a door, such as a raised step or threshold.  Trim any bushes or trees on the path to your home.  Use bright outdoor lighting.  Clear any walking paths of anything that might make someone trip, such as rocks or tools.  Regularly check to see if handrails are loose or broken. Make sure that both sides of any steps have handrails.  Any raised decks and porches should have guardrails on the edges.  Have any leaves, snow, or ice cleared regularly.  Use sand or salt on walking paths during winter.  Clean up any spills in your garage right away. This includes oil or grease spills. What can I do in the bathroom?  Use night lights.  Install grab bars by the toilet and in the tub and shower. Do not use towel bars as grab bars.  Use non-skid mats or decals in the tub or shower.  If you need to sit down in the shower, use a plastic, non-slip stool.  Keep the floor dry. Clean up any water that spills on the  floor as soon as it happens.  Remove soap buildup in the tub or shower regularly.  Attach bath mats securely with double-sided non-slip rug tape.  Do not have throw rugs and other things on the floor that can make you trip. What can I do in the bedroom?  Use night lights.  Make sure that you have a light by your bed that is easy to reach.  Do not use any sheets or blankets that are too big for your bed. They should not hang down onto the floor.  Have a firm chair that has side arms. You can use this for support while you get dressed.  Do not have throw rugs and other things on the floor that can make you trip. What can I do in the kitchen?  Clean up any spills right away.  Avoid walking on wet floors.  Keep items that you use a lot in easy-to-reach places.  If you need to reach something above you, use a strong step stool that has a grab bar.  Keep electrical cords out of the way.  Do not use floor polish or wax that makes floors slippery. If you must use wax, use non-skid floor wax.  Do not have throw rugs  and other things on the floor that can make you trip. What can I do with my stairs?  Do not leave any items on the stairs.  Make sure that there are handrails on both sides of the stairs and use them. Fix handrails that are broken or loose. Make sure that handrails are as long as the stairways.  Check any carpeting to make sure that it is firmly attached to the stairs. Fix any carpet that is loose or worn.  Avoid having throw rugs at the top or bottom of the stairs. If you do have throw rugs, attach them to the floor with carpet tape.  Make sure that you have a light switch at the top of the stairs and the bottom of the stairs. If you do not have them, ask someone to add them for you. What else can I do to help prevent falls?  Wear shoes that:  Do not have high heels.  Have rubber bottoms.  Are comfortable and fit you well.  Are closed at the toe. Do not wear  sandals.  If you use a stepladder:  Make sure that it is fully opened. Do not climb a closed stepladder.  Make sure that both sides of the stepladder are locked into place.  Ask someone to hold it for you, if possible.  Clearly mark and make sure that you can see:  Any grab bars or handrails.  First and last steps.  Where the edge of each step is.  Use tools that help you move around (mobility aids) if they are needed. These include:  Canes.  Walkers.  Scooters.  Crutches.  Turn on the lights when you go into a dark area. Replace any light bulbs as soon as they burn out.  Set up your furniture so you have a clear path. Avoid moving your furniture around.  If any of your floors are uneven, fix them.  If there are any pets around you, be aware of where they are.  Review your medicines with your doctor. Some medicines can make you feel dizzy. This can increase your chance of falling. Ask your doctor what other things that you can do to help prevent falls. This information is not intended to replace advice given to you by your health care provider. Make sure you discuss any questions you have with your health care provider. Document Released: 08/07/2009 Document Revised: 03/18/2016 Document Reviewed: 11/15/2014 Elsevier Interactive Patient Education  2017 [employer].

## 2020-07-08 NOTE — Progress Notes (Signed)
PCP notes:  Health Maintenance: Flu- due   Abnormal Screenings: none   Patient concerns: none   Nurse concerns: none   Next PCP appt.: 07/18/2020 @ 9:40 am

## 2020-07-08 NOTE — Progress Notes (Signed)
Subjective:   [name] is a 86 y.o. female who presents for Medicare Annual (Subsequent) preventive examination.  Review of Systems: N/A      I connected with the patient today by telephone and verified that I am speaking with the correct person using two identifiers. Location patient: home Location nurse: work Persons participating in the telephone visit: patient, nurse.   I discussed the limitations, risks, security and privacy concerns of performing an evaluation and management service by telephone and the availability of in person appointments. I also discussed with the patient that there may be a patient responsible charge related to this service. The patient expressed understanding and verbally consented to this telephonic visit.        Cardiac Risk Factors include: advanced age (>81men, >69 women);hypertension;dyslipidemia     Objective:    Today's Vitals   07/08/20 1557  PainSc: 8    There is no height or weight on file to calculate BMI.  Advanced Directives 07/08/2020 06/18/2019 02/24/2018 01/18/2017 01/22/2016  Does Patient Have a Medical Advance Directive? Yes Yes Yes Yes Yes  Type of Paramedic of [place];Living will [place];Living will Living will [place];Living will [place];Living will  Does patient want to make changes to medical advance directive? - - - - No - Patient declined  Copy of [place] in Chart? No - copy requested No - copy requested - No - copy requested No - copy requested    Current Medications (verified) Outpatient Encounter Medications as of 07/08/2020  Medication Sig  . chlorthalidone (HYGROTON) 25 MG tablet Take 1 tablet (25 mg total) by mouth daily.  . Cholecalciferol (VITAMIN D PO) Take 5,000 Units by mouth daily.  . colestipol (COLESTID) 1 G tablet Take 2 g by mouth 3 (three) times daily.   Marland Kitchen diltiazem (CARDIZEM CD) 240 MG 24 hr  capsule Take 1 capsule (240 mg total) by mouth daily.  Marland Kitchen glucosamine-chondroitin 500-400 MG tablet Take 2 tablets by mouth daily.   Marland Kitchen Opium 10 MG/ML (1%) TINC Please take 0.2-0.4 mL three times per day  . paregoric 2 MG/5ML solution Take 5 mLs by mouth as needed for diarrhea or loose stools.  . cyclobenzaprine (FLEXERIL) 10 MG tablet Take 0.5-1 tablets (5-10 mg total) by mouth at bedtime as needed for muscle spasms. (Patient not taking: Reported on 03/03/2020)   No facility-administered encounter medications on file as of 07/08/2020.    Allergies (verified) Patient has no known allergies.   History: Past Medical History:  Diagnosis Date  . Allergic rhinitis, cause unspecified   . Diarrhea   . Edema   . History of colon cancer    s/p surgery, chemotherapy  . Internal hemorrhoids without mention of complication   . Malignant neoplasm of rectum ([place])   . Osteoarthrosis, unspecified whether generalized or localized, unspecified site   . Osteoporosis, unspecified   . Other acute reactions to stress   . Other and unspecified hyperlipidemia   . Other screening mammogram   . Pain in limb   . Special screening for osteoporosis   . Ulceration of vulva, unspecified   . Unspecified essential hypertension    Past Surgical History:  Procedure Laterality Date  . dental implants    . PARTIAL COLECTOMY  9/10   temporary colostomy   Family History  Problem Relation Age of Onset  . Stroke Mother   . Arthritis Mother   . Arthritis  Brother   . Depression Sister    Social History   Socioeconomic History  . Marital status: Married    Spouse name: Not on file  . Number of children: 3  . Years of education: Not on file  . Highest education level: Not on file  Occupational History  . Occupation: Location manager  Tobacco Use  . Smoking status: Never Smoker  . Smokeless tobacco: Never Used  Vaping Use  . Vaping Use: Never used  Substance and Sexual Activity  . Alcohol use:  No    Alcohol/week: 0.0 standard drinks  . Drug use: Yes    Types: Opium  . Sexual activity: Yes  Other Topics Concern  . Not on file  Social History Narrative  . Not on file   Social Determinants of Health   Financial Resource Strain: Low Risk   . Difficulty of Paying Living Expenses: Not hard at all  Food Insecurity: No Food Insecurity  . Worried About Charity fundraiser in the Last Year: Never true  . Ran Out of Food in the Last Year: Never true  Transportation Needs: No Transportation Needs  . Lack of Transportation (Medical): No  . Lack of Transportation (Non-Medical): No  Physical Activity: Inactive  . Days of Exercise per Week: 0 days  . Minutes of Exercise per Session: 0 min  Stress: No Stress Concern Present  . Feeling of Stress : Not at all  Social Connections:   . Frequency of Communication with Friends and Family: Not on file  . Frequency of Social Gatherings with Friends and Family: Not on file  . Attends Religious Services: Not on file  . Active Member of Clubs or Organizations: Not on file  . Attends Archivist Meetings: Not on file  . Marital Status: Not on file    Tobacco Counseling Counseling given: Not Answered   Clinical Intake:  Pre-visit preparation completed: Yes  Pain : 0-10 Pain Score: 8  Pain Type: Chronic pain Pain Location: Leg Pain Orientation: Left, Right Pain Descriptors / Indicators: Aching Pain Onset: More than a month ago Pain Frequency: Intermittent     Nutritional Risks: None Diabetes: No  How often do you need to have someone help you when you read instructions, pamphlets, or other written materials from your doctor or pharmacy?: 1 - Never What is the last grade level you completed in school?: associates  Diabetic: No Nutrition Risk Assessment:  Has the patient had any N/V/D within the last 2 months?  No  Does the patient have any non-healing wounds?  No  Has the patient had any unintentional weight loss  or weight gain?  No   Diabetes:  Is the patient diabetic?  No  If diabetic, was a CBG obtained today?  N/A Did the patient bring in their glucometer from home?  N/A How often do you monitor your CBG's? N/A.   Financial Strains and Diabetes Management:  Are you having any financial strains with the device, your supplies or your medication? N/A.  Does the patient want to be seen by Chronic Care Management for management of their diabetes?  N/A Would the patient like to be referred to a Nutritionist or for Diabetic Management?  N/A    Interpreter Needed?: No  Information entered by :: CJohnson, LPN   Activities of Daily Living In your present state of health, do you have any difficulty performing the following activities: 07/08/2020  Hearing? N  Vision? N  Difficulty concentrating or making  decisions? N  Walking or climbing stairs? N  Dressing or bathing? N  Doing errands, shopping? N  Preparing Food and eating ? N  Using the Toilet? N  In the past six months, have you accidently leaked urine? N  Do you have problems with loss of bowel control? N  Managing your Medications? N  Managing your Finances? N  Housekeeping or managing your Housekeeping? N  Some recent data might be hidden    Patient Care Team: Jinny Sanders, MD as PCP - General [place], Vinie Sill as Consulting Physician (Gastroenterology)  Indicate any recent Medical Services you may have received from other than [facility] providers in the past year (date may be approximate).     Assessment:   This is a routine wellness examination for [name].  Hearing/Vision screen  Hearing Screening   125Hz  250Hz  500Hz  1000Hz  2000Hz  3000Hz  4000Hz  6000Hz  8000Hz   Right ear:           Left ear:           Vision Screening Comments: Advised patient to get annual eye exams   Dietary issues and exercise activities discussed: Current Exercise Habits: The patient does not participate in regular exercise at present, Exercise limited  by: None identified  Goals    . Increase physical activity     Starting 01/18/2017, I will continue to walk at least 30 min 3-4 days per week.     . Increase physical activity     Starting 02/24/18, I will continue to walk at least 45 minutes daily.     . Patient Stated     06/18/2019, get some help for knees    . Patient Stated     07/08/2020, I will maintain and continue medications as prescribed.       Depression Screen PHQ 2/9 Scores 07/08/2020 06/18/2019 02/24/2018 01/18/2017 01/22/2016 01/24/2015 12/14/2013  PHQ - 2 Score 0 0 0 0 0 0 0  PHQ- 9 Score 0 0 0 - - - -    Fall Risk Fall Risk  07/08/2020 06/18/2019 02/24/2018 01/18/2017 01/22/2016  Falls in the past year? 0 0 Yes No No  Comment - - fell after tripping over cords - -  Number falls in past yr: 0 - 1 - -  Injury with Fall? 0 - Yes - -  Risk for fall due to : Medication side effect Medication side effect - - -  Follow up Falls evaluation completed;Falls prevention discussed Falls evaluation completed;Falls prevention discussed - - -    Any stairs in or around the home? Yes  If so, are there any without handrails? No  Home free of loose throw rugs in walkways, pet beds, electrical cords, etc? Yes  Adequate lighting in your home to reduce risk of falls? Yes   ASSISTIVE DEVICES UTILIZED TO PREVENT FALLS:  Life alert? No  Use of a cane, walker or w/c? No  Grab bars in the bathroom? No  Shower chair or bench in shower? No  Elevated toilet seat or a handicapped toilet? No   TIMED UP AND GO:  Was the test performed? N/A, telephonic visit .  Cognitive Function: MMSE - Mini Mental State Exam 07/08/2020 06/18/2019 02/24/2018 01/18/2017 01/22/2016  Not completed: Refused - - - -  Orientation to time - 4 5 5 5   Orientation to Place - 5 5 5 5   Registration - 3 3 3 3   Attention/ Calculation - 5 0 0 0  Recall - 2 3 3  3  Language- name 2 objects - 0 0 0 0  Language- repeat - 1 1 1 1   Language- follow 3 step command - 0 3 3 3   Language-  read & follow direction - 0 0 0 0  Write a sentence - 0 0 0 0  Copy design - 0 0 0 0  Total score - 20 20 20 20   Mini Cog  Mini-Cog screen was not completed. Patient refused. Maximum score is 22. A value of 0 denotes this part of the MMSE was not completed or the patient failed this part of the Mini-Cog screening.       Immunizations Immunization History  Administered Date(s) Administered  . Influenza Whole 09/25/2009  . Moderna SARS-COVID-2 Vaccination 12/21/2019, 01/20/2020  . Pneumococcal Conjugate-13 12/14/2013  . Pneumococcal Polysaccharide-23 10/25/2004  . Td 10/25/2004    TDAP status: Due, Education has been provided regarding the importance of this vaccine. Advised may receive this vaccine at local pharmacy or Health Dept. Aware to provide a copy of the vaccination record if obtained from local pharmacy or Health Dept. Verbalized acceptance and understanding. Flu Vaccine status: due, will get at upcoming office visit  Pneumococcal vaccine status: Up to date Covid-19 vaccine status: Completed vaccines  Qualifies for Shingles Vaccine? Yes   Zostavax completed No   Shingrix Completed?: No.    Education has been provided regarding the importance of this vaccine. Patient has been advised to call insurance company to determine out of pocket expense if they have not yet received this vaccine. Advised may also receive vaccine at local pharmacy or Health Dept. Verbalized acceptance and understanding.  Screening Tests Health Maintenance  Topic Date Due  . INFLUENZA VACCINE  05/25/2020  . DEXA SCAN  12/24/2023 (Originally 04/03/2000)  . TETANUS/TDAP  10/24/2024 (Originally 10/25/2014)  . COVID-19 Vaccine  Completed  . PNA vac Low Risk Adult  Completed    Health Maintenance  Health Maintenance Due  Topic Date Due  . INFLUENZA VACCINE  05/25/2020    Colorectal cancer screening: No longer required.  Mammogram status: No longer required.  Bone Density status: declined  Lung  Cancer Screening: (Low Dose CT Chest recommended if Age 59-80 years, 30 pack-year currently smoking OR have quit w/in 15 years.) does not qualify.   Additional Screening:  Hepatitis C Screening: does not qualify; Completed N/A  Vision Screening: Recommended annual ophthalmology exams for early detection of glaucoma and other disorders of the eye. Is the patient up to date with their annual eye exam?  No , last eye exam about 5 years ago, advised to get annual eye exams  Who is the provider or what is the name of the office in which the patient attends annual eye exams? Does not have one currently If pt is not established with a provider, would they like to be referred to a provider to establish care? No .   Dental Screening: Recommended annual dental exams for proper oral hygiene  Community Resource Referral / Chronic Care Management: CRR required this visit?  No   CCM required this visit?  No      Plan:     I have personally reviewed and noted the following in the patient's chart:   . Medical and social history . Use of alcohol, tobacco or illicit drugs  . Current medications and supplements . Functional ability and status . Nutritional status . Physical activity . Advanced directives . List of other physicians . Hospitalizations, surgeries, and ER visits in previous 63  months . Vitals . Screenings to include cognitive, depression, and falls . Referrals and appointments  In addition, I have reviewed and discussed with patient certain preventive protocols, quality metrics, and best practice recommendations. A written personalized care plan for preventive services as well as general preventive health recommendations were provided to patient.   Due to this being a telephonic visit, the after visit summary with patients personalized plan was offered to patient via mail or my-chart. Patient preferred to pick up at office at next visit.   Andrez Grime, LPN   [id number]

## 2020-07-09 ENCOUNTER — Ambulatory Visit: Payer: Medicare Other

## 2020-07-11 ENCOUNTER — Other Ambulatory Visit: Payer: Medicare Other

## 2020-07-11 ENCOUNTER — Telehealth: Payer: Self-pay | Admitting: Family Medicine

## 2020-07-11 DIAGNOSIS — E559 Vitamin D deficiency, unspecified: Secondary | ICD-10-CM

## 2020-07-11 DIAGNOSIS — E782 Mixed hyperlipidemia: Secondary | ICD-10-CM

## 2020-07-11 NOTE — Telephone Encounter (Signed)
-----   Message from Cloyd Stagers, RT sent at 06/24/2020  9:58 AM EDT ----- Regarding: Lab Orders for Friday 9.17.2021 Please place lab orders for Friday 9.17.2021, office visit for physical on Friday 9.24.2021 Thank you, Dyke Maes RT(R)

## 2020-07-14 ENCOUNTER — Ambulatory Visit: Payer: Medicare Other

## 2020-07-18 ENCOUNTER — Encounter: Payer: Medicare Other | Admitting: Family Medicine

## 2020-10-26 ENCOUNTER — Emergency Department
Admission: EM | Admit: 2020-10-26 | Discharge: 2020-10-27 | Disposition: A | Discharge status: Home / Self Care | Payer: Medicare Other | Attending: Emergency Medicine | Admitting: Emergency Medicine

## 2020-10-26 ENCOUNTER — Other Ambulatory Visit: Payer: Self-pay

## 2020-10-26 ENCOUNTER — Encounter: Payer: Self-pay | Admitting: Emergency Medicine

## 2020-10-26 DIAGNOSIS — E860 Dehydration: Secondary | ICD-10-CM | POA: Insufficient documentation

## 2020-10-26 DIAGNOSIS — R109 Unspecified abdominal pain: Secondary | ICD-10-CM | POA: Diagnosis not present

## 2020-10-26 DIAGNOSIS — Z79899 Other long term (current) drug therapy: Secondary | ICD-10-CM | POA: Diagnosis not present

## 2020-10-26 DIAGNOSIS — R197 Diarrhea, unspecified: Secondary | ICD-10-CM | POA: Diagnosis not present

## 2020-10-26 DIAGNOSIS — I10 Essential (primary) hypertension: Secondary | ICD-10-CM | POA: Diagnosis not present

## 2020-10-26 DIAGNOSIS — Z85048 Personal history of other malignant neoplasm of rectum, rectosigmoid junction, and anus: Secondary | ICD-10-CM | POA: Diagnosis not present

## 2020-10-26 DIAGNOSIS — Z85038 Personal history of other malignant neoplasm of large intestine: Secondary | ICD-10-CM | POA: Diagnosis not present

## 2020-10-26 LAB — COMPREHENSIVE METABOLIC PANEL
ALT: 13 U/L (ref 0–44)
AST: 12 U/L — ABNORMAL LOW (ref 15–41)
Albumin: 4 g/dL (ref 3.5–5.0)
Alkaline Phosphatase: 79 U/L (ref 38–126)
Anion gap: 6 (ref 5–15)
BUN: 18 mg/dL (ref 8–23)
CO2: 28 mmol/L (ref 22–32)
Calcium: 9.9 mg/dL (ref 8.9–10.3)
Chloride: 106 mmol/L (ref 98–111)
Creatinine, Ser: 0.68 mg/dL (ref 0.44–1.00)
GFR, Estimated: >60 mL/min (ref >60)
Glucose, Bld: 115 mg/dL — ABNORMAL HIGH (ref 70–99)
Potassium: 3.7 mmol/L (ref 3.5–5.1)
Sodium: 140 mmol/L (ref 135–145)
Total Bilirubin: 0.6 mg/dL (ref 0.3–1.2)
Total Protein: 6.9 g/dL (ref 6.5–8.1)

## 2020-10-26 LAB — CBC
HCT: 45.2 % (ref 36.0–46.0)
Hemoglobin: 14.9 g/dL (ref 12.0–15.0)
MCH: 30.1 pg (ref 26.0–34.0)
MCHC: 33 g/dL (ref 30.0–36.0)
MCV: 91.3 fL (ref 80.0–100.0)
Platelets: 336 10*3/uL (ref 150–400)
RBC: 4.95 MIL/uL (ref 3.87–5.11)
RDW: 14 % (ref 11.5–15.5)
WBC: 7.4 10*3/uL (ref 4.0–10.5)
nRBC: 0 % (ref 0.0–0.2)

## 2020-10-26 LAB — LIPASE, BLOOD: Lipase: 78 U/L — ABNORMAL HIGH (ref 11–51)

## 2020-10-26 LAB — TROPONIN I (HIGH SENSITIVITY)
Troponin I (High Sensitivity): 18 ng/L — ABNORMAL HIGH (ref <18)
Troponin I (High Sensitivity): 7 ng/L (ref <18)

## 2020-10-26 MED ORDER — IOHEXOL 9 MG/ML PO SOLN
500.0000 mL | ORAL | Status: AC
  Administered 2020-10-27 (×2): 500 mL via ORAL

## 2020-10-26 MED ORDER — LACTATED RINGERS IV BOLUS
1000.0000 mL | Freq: Once | INTRAVENOUS | Status: AC
  Administered 2020-10-27: 1000 mL via INTRAVENOUS

## 2020-10-26 NOTE — ED Notes (Signed)
Lab called to add on troponin to initial labs and second one to be drawn now

## 2020-10-26 NOTE — ED Triage Notes (Signed)
Pt to ED via POV c/o diarrhea x 6 days. Pt states that everything she drinks is going straight through her. Pt is in NAD.

## 2020-10-26 NOTE — ED Provider Notes (Signed)
[facility] Emergency Department Provider Note ____________________________________________   Event Date/Time   First MD Initiated Contact with Patient 10/26/20 2253     (approximate)  I have reviewed the triage vital signs and the nursing notes.  HISTORY  Chief Complaint Diarrhea   HPI [name] is a 87 y.o. femalewho presents to the ED for evaluation of diarrhea.  Chart review indicates history of HTN, colorectal cancer s/p resection with diverting loop ileostomy which has since been reversed. Chronic diarrhea controlled with paregoric and Colestid.  Patient presents to the ED with 1 week of acute diarrhea.  She indicates her stools typically well controlled with her paregoric, she still has this, but indicates that every meal "goes right through me."  She indicates some mild abdominal cramping with this, but really minimizes the pain, and indicates that she really is not in any pain.  She denies nausea or emesis, fevers, recent antibiotics, cough or shortness of breath, syncope, dysuria or hematuria.  Denies melena or hematochezia.  Reports coming to the ED for evaluation of dehydration at the urging of her husband.   Past Medical History:  Diagnosis Date  . Allergic rhinitis, cause unspecified   . Diarrhea   . Edema   . History of colon cancer    s/p surgery, chemotherapy  . Internal hemorrhoids without mention of complication   . Malignant neoplasm of rectum ([place])   . Osteoarthrosis, unspecified whether generalized or localized, unspecified site   . Osteoporosis, unspecified   . Other acute reactions to stress   . Other and unspecified hyperlipidemia   . Other screening mammogram   . Pain in limb   . Special screening for osteoporosis   . Ulceration of vulva, unspecified   . Unspecified essential hypertension     Patient Active Problem List   Diagnosis Date Noted  . Right-sided low back pain without sciatica 01/25/2019  . Anterior  neck pain 07/23/2016  . Peripheral edema 07/23/2016  . Hypokalemia 01/27/2016  . Abnormal weight loss 01/27/2016  . Counseling regarding end of life decision making 01/24/2015  . Vitamin D deficiency 12/12/2012  . Chronic diarrhea 10/27/2009  . RECTAL CANCER 07/23/2009  . ALLERGIC RHINITIS 01/16/2009  . HEMORRHOIDS, INTERNAL 11/20/2008  . ANXIETY, SITUATIONAL 05/31/2007  . Hyperlipidemia 05/30/2007  . Essential hypertension, benign 05/30/2007  . Osteoarthritis of knees, bilateral 05/30/2007  . At high risk for osteoporosis, no DEXA 05/30/2007    Past Surgical History:  Procedure Laterality Date  . dental implants    . PARTIAL COLECTOMY  9/10   temporary colostomy    Prior to Admission medications   Medication Sig Start Date End Date Taking? Authorizing Provider  chlorthalidone (HYGROTON) 25 MG tablet Take 1 tablet (25 mg total) by mouth daily. 07/18/19   Bedsole, Amy E, MD  Cholecalciferol (VITAMIN D PO) Take 5,000 Units by mouth daily.    [provider]  colestipol (COLESTID) 1 G tablet Take 2 g by mouth 3 (three) times daily.     [provider]  cyclobenzaprine (FLEXERIL) 10 MG tablet Take 0.5-1 tablets (5-10 mg total) by mouth at bedtime as needed for muscle spasms. Patient not taking: Reported on 03/03/2020 01/25/19   Jinny Sanders, MD  diltiazem (CARDIZEM CD) 240 MG 24 hr capsule Take 1 capsule (240 mg total) by mouth daily. 07/16/19   Bedsole, Amy E, MD  glucosamine-chondroitin 500-400 MG tablet Take 2 tablets by mouth daily.     [provider]  Opium 10 MG/ML (1%) TINC Please take 0.2-0.4 mL three times per day 12/25/18   [provider]  paregoric 2 MG/5ML solution Take 5 mLs by mouth as needed for diarrhea or loose stools.    [provider]    Allergies Patient has no known allergies.  Family History  Problem Relation Age of Onset  . Stroke Mother   . Arthritis Mother   . Arthritis Brother   . Depression Sister      Social History Social History   Tobacco Use  . Smoking status: Never Smoker  . Smokeless tobacco: Never Used  Vaping Use  . Vaping Use: Never used  Substance Use Topics  . Alcohol use: No    Alcohol/week: 0.0 standard drinks  . Drug use: Yes    Types: Opium    Review of Systems  Constitutional: No fever/chills Eyes: No visual changes. ENT: No sore throat. Cardiovascular: Denies chest pain. Respiratory: Denies shortness of breath. Gastrointestinal:   No nausea, no vomiting.  No constipation. Positive for diarrhea Genitourinary: Negative for dysuria. Musculoskeletal: Negative for back pain. Skin: Negative for rash. Neurological: Negative for headaches, focal weakness or numbness.  ____________________________________________   PHYSICAL EXAM:  VITAL SIGNS: Vitals:   10/27/20 0045 10/27/20 0209  BP: (!) 207/91 (!) 160/90  Pulse: 74 86  Resp: 16 17  Temp:    SpO2: 98% 97%     Constitutional: Alert and oriented. Well appearing and in no acute distress.  Pleasant and conversational in full sentences Eyes: Conjunctivae are normal. PERRL. EOMI. Head: Atraumatic. Nose: No congestion/rhinnorhea. Mouth/Throat: Mucous membranes are moist.  Oropharynx non-erythematous. Neck: No stridor. No cervical spine tenderness to palpation. Cardiovascular: Normal rate, regular rhythm. Grossly normal heart sounds.  Good peripheral circulation. Respiratory: Normal respiratory effort.  No retractions. Lungs CTAB. Gastrointestinal: Soft , nondistended, nontender to palpation. No CVA tenderness. Musculoskeletal: No lower extremity tenderness nor edema.  No joint effusions. No signs of acute trauma. Neurologic:  Normal speech and language. No gross focal neurologic deficits are appreciated. No gait instability noted. Skin:  Skin is warm, dry and intact. No rash noted. Psychiatric: Mood and affect are normal. Speech and behavior are  normal.  ____________________________________________   LABS (all labs ordered are listed, but only abnormal results are displayed)  Labs Reviewed  LIPASE, BLOOD - Abnormal; Notable for the following components:      Result Value   Lipase 78 (*)    All other components within normal limits  COMPREHENSIVE METABOLIC PANEL - Abnormal; Notable for the following components:   Glucose, Bld 115 (*)    AST 12 (*)    All other components within normal limits  URINALYSIS, COMPLETE (UACMP) WITH MICROSCOPIC - Abnormal; Notable for the following components:   Color, Urine YELLOW (*)    APPearance CLOUDY (*)    Hgb urine dipstick SMALL (*)    Ketones, ur 20 (*)    Leukocytes,Ua SMALL (*)    Bacteria, UA MANY (*)    All other components within normal limits  TROPONIN I (HIGH SENSITIVITY) - Abnormal; Notable for the following components:   Troponin I (High Sensitivity) 18 (*)    All other components within normal limits  GASTROINTESTINAL PANEL BY PCR, STOOL (REPLACES STOOL CULTURE)  C DIFFICILE QUICK SCREEN W PCR REFLEX  URINE CULTURE  CBC  TROPONIN I (HIGH SENSITIVITY)   ____________________________________________  12 Lead EKG Sinus rhythm, rate of 84 bpm.  Normal axis.  Normal intervals.  Biphasic T wave to  V5, V6, no further evidence of acute ischemia.  ____________________________________________  RADIOLOGY  ED MD interpretation: CT abdomen/pelvis reviewed by me without evidence of acute intra-abdominal pathology.  Official radiology report(s): CT ABDOMEN PELVIS W CONTRAST  Result Date: 10/27/2020 CLINICAL DATA:  Unspecified abdominal pain, diarrhea, remote history of colon cancer EXAM: CT ABDOMEN AND PELVIS WITH CONTRAST TECHNIQUE: Multidetector CT imaging of the abdomen and pelvis was performed using the standard protocol following bolus administration of intravenous contrast. CONTRAST:  83mL OMNIPAQUE IOHEXOL 300 MG/ML  SOLN COMPARISON:  12/24/2009 FINDINGS: Lower chest: The  visualized lung bases are clear bilaterally. The visualized heart and pericardium are unremarkable. Hepatobiliary: Multiple simple cysts are seen scattered throughout the liver. No enhancing liver lesion identified. Gallbladder unremarkable. No intra or extrahepatic biliary ductal dilation. Pancreas: Unremarkable Spleen: Unremarkable Adrenals/Urinary Tract: The adrenal glands are unremarkable. Multiple parapelvic cysts are seen within the kidneys bilaterally. Multiple simple cortical cysts are identified. Cortical calcification within the a lower pole the right kidney possibly representing an involuted cyst, is unchanged. The kidneys are otherwise unremarkable. The bladder is unremarkable. Stomach/Bowel: Surgical changes of distal colectomy are identified with a colorectal anastomotic staple line identified. Soft tissue infiltration within the presacral space may be postsurgical in nature. There is a 14 mm x 20 mm bland appearing loculated fluid collection within the presacral space possibly representing a postoperative seroma or chronic hematoma. This does, however, appear new since prior examination. The stomach, small bowel, and large bowel are otherwise unremarkable. No evidence of obstruction or focal inflammation. Vascular/Lymphatic: Moderate aortoiliac atherosclerotic calcification. No aortic aneurysm. No pathologic adenopathy within the abdomen and pelvis. Reproductive: Uterus and bilateral adnexa are unremarkable. Other: No abdominal wall hernia or abnormality. No abdominopelvic ascites. Musculoskeletal: Degenerative changes are seen within the a lumbar spine with grade 1 anterolisthesis of L5 upon S1. Vertebral hemangioma noted within the L1 vertebral body. No suspicious lytic or blastic bone lesions are seen. The osseous structures are diffusely osteopenic. IMPRESSION: No acute intra-abdominal pathology identified. No definite radiographic explanation for the patient's symptoms. Status post partial  colectomy. 20 mm bland appearing fluid collection within the presacral space may represent a postoperative seroma or chronic hematoma. No significant surrounding inflammatory change identified. Aortic Atherosclerosis (ICD10-I70.0). Electronically Signed   By: Fidela Salisbury MD   On: 10/27/2020 03:22    ____________________________________________   PROCEDURES and INTERVENTIONS  Procedure(s) performed (including Critical Care):  .1-3 Lead EKG Interpretation Performed by: Vladimir Crofts, MD Authorized by: Vladimir Crofts, MD     Interpretation: normal     ECG rate:  80   ECG rate assessment: normal     Rhythm: sinus rhythm     Ectopy: none     Conduction: normal      Medications  lactated ringers bolus 1,000 mL (0 mLs Intravenous Stopped 10/27/20 0209)  iohexol (OMNIPAQUE) 9 MG/ML oral solution 500 mL (500 mLs Oral Contrast Given 10/27/20 0115)  chlorthalidone (HYGROTON) tablet 25 mg (25 mg Oral Given 10/27/20 0126)  diltiazem (CARDIZEM CD) 24 hr capsule 240 mg (240 mg Oral Given 10/27/20 0126)  iohexol (OMNIPAQUE) 300 MG/ML solution 75 mL (75 mLs Intravenous Contrast Given 10/27/20 0302)    ____________________________________________   MDM / ED COURSE   87 year old woman with history of colon cancer and multiple resections but to the ED with acute on chronic diarrhea and abdominal discomfort, causing dehydration, but without evidence of additional acute pathology and amenable to outpatient management.  Hemodynamically stable.  Hypertensive necessitating administration of her home  BP medications, reviewed thereafter.  Exam with stigmata of dehydration.  No peritoneal abdomen or evidence of additional acute pathology.  Blood work is largely unremarkable.  She has a marginal elevation of her lipase, but no epigastric pain or emesis.  Cloudy urine with ketonuria, but no evidence of UTI.  Due to her history of multiple colonic resections, increasing symptoms and complaints of abdominal cramping, CT  abdomen/pelvis obtained and does not demonstrate evidence of SBO or recurrence of cancer.  Patient feels better after liter of LR, tolerating p.o. intake and is asymptomatic now.  We will discharge with close return precautions.  Advised her to follow-up with her GI physician.   Clinical Course as of 10/27/20 0427  Mon Oct 27, 2020  0047 Nurse informs me of elevated blood pressure systolic greater than [id number].  Went to reassess the patient, and she is in no acute distress, systolic [id number].  She reports that she feels fine.  Further indicates that she did not take her antihypertensive this morning .  We will provide her home antihypertensives and continue to monitor her [DS]  78 Educated patient of reassuring CT. has been out bedside.  He provides additional history.  Patient reports feeling well.  We discussed following up with GI and we discussed return precautions for the ED [DS]  [phone] Husband does now inform me that patient had taken more than prescribed of her opium solution few days prior to her diarrhea, and she did finish her months prescription early and was out for a few days.  We thoroughly discussed adherence to her prescription medications and this was the likely source of her symptoms [DS]    Clinical Course User Index [DS] Vladimir Crofts, MD    ____________________________________________   FINAL CLINICAL IMPRESSION(S) / ED DIAGNOSES  Final diagnoses:  Diarrhea, unspecified type  Dehydration     ED Discharge Orders    None       Jamilet Ambroise Tamala Julian   Note:  This document was prepared using Dragon voice recognition software and may include unintentional dictation errors.   Vladimir Crofts, MD 10/27/20 0430

## 2020-10-27 ENCOUNTER — Emergency Department: Payer: Medicare Other

## 2020-10-27 ENCOUNTER — Encounter: Payer: Self-pay | Admitting: Radiology

## 2020-10-27 DIAGNOSIS — R109 Unspecified abdominal pain: Secondary | ICD-10-CM | POA: Diagnosis not present

## 2020-10-27 LAB — URINALYSIS, COMPLETE (UACMP) WITH MICROSCOPIC
Bilirubin Urine: NEGATIVE
Glucose, UA: NEGATIVE mg/dL
Ketones, ur: 20 mg/dL — AB
Nitrite: NEGATIVE
Protein, ur: NEGATIVE mg/dL
Specific Gravity, Urine: 1.014 (ref 1.005–1.030)
pH: 5 (ref 5.0–8.0)

## 2020-10-27 MED ORDER — CHLORTHALIDONE 25 MG PO TABS
25.0000 mg | ORAL_TABLET | Freq: Once | ORAL | Status: AC
  Administered 2020-10-27: 25 mg via ORAL
  Filled 2020-10-27: qty 1

## 2020-10-27 MED ORDER — IOHEXOL 300 MG/ML  SOLN
75.0000 mL | Freq: Once | INTRAMUSCULAR | Status: AC | PRN
  Administered 2020-10-27: 75 mL via INTRAVENOUS

## 2020-10-27 MED ORDER — DILTIAZEM HCL ER COATED BEADS 240 MG PO CP24
240.0000 mg | ORAL_CAPSULE | Freq: Once | ORAL | Status: AC
  Administered 2020-10-27: 240 mg via ORAL
  Filled 2020-10-27: qty 1

## 2020-10-27 NOTE — Discharge Instructions (Signed)
Please follow-up with your GI doctor to discuss increasing your dose of paregoric opiate solution.  If you develop any worsening diarrhea, particularly with abdominal pains or fever, please return to the ED.

## 2020-10-29 LAB — URINE CULTURE: Culture: >=100000 — AB

## 2020-10-30 NOTE — Patient Outreach (Addendum)
ED Antimicrobial Stewardship Positive Culture Follow Up   [name] is an 87 y.o. female who presented to [facility] on 10/26/2020 with a chief complaint of  Chief Complaint  Patient presents with  . Diarrhea    Recent Results (from the past 720 hour(s))  Urine Culture     Status: Abnormal   Collection Time: 10/27/20  1:20 AM   Specimen: Urine, Random  Result Value Ref Range Status   Specimen Description   Final    URINE, RANDOM Performed at [facility], [address], [place], [place] [address]    Special Requests   Final    NONE Performed at [facility], [address] [place], [place] [address]    Culture >=100,000 COLONIES/mL ESCHERICHIA COLI (A)  Final   Report Status 10/29/2020 FINAL  Final   Organism ID, Bacteria ESCHERICHIA COLI (A)  Final      Susceptibility   Escherichia coli - MIC*    AMPICILLIN 4 SENSITIVE Sensitive     CEFAZOLIN <=4 SENSITIVE Sensitive     CEFEPIME <=0.12 SENSITIVE Sensitive     CEFTRIAXONE <=0.25 SENSITIVE Sensitive     CIPROFLOXACIN <=0.25 SENSITIVE Sensitive     GENTAMICIN <=1 SENSITIVE Sensitive     IMIPENEM <=0.25 SENSITIVE Sensitive     NITROFURANTOIN <=16 SENSITIVE Sensitive     TRIMETH/SULFA <=20 SENSITIVE Sensitive     AMPICILLIN/SULBACTAM <=2 SENSITIVE Sensitive     PIP/TAZO <=4 SENSITIVE Sensitive     * >=100,000 COLONIES/mL ESCHERICHIA COLI   Assessment [x]  Patient discharged originally without antimicrobial agent.   Cultures show Pan-S Ecoli. Per discussion with MD provider, will reach out and assess if patient has developed any symptoms of a UTI.  1st attempt: (called: [phone]) Left VM with CB to main pharmacy. Notified that [facility] ED visit just wanted to discuss follow-up result from recent admission.  Was unable to get 2nd contact and pt did not return call.  New antibiotic prescription:  If no symptoms, will withhold any antibiotics at this time. If experiencing symptoms, will prescribe  Cephalexin 500mg  PO TID x5d.  Currently has  [facility] listed as preferred (will clarify on call) - [phone]  [address]  [place] [phone] [place]   ED Provider: [place]   [id number] 10/30/2020, 5:41 PM Clinical Pharmacist Monday - Friday phone -  [phone] Saturday - Sunday phone - [phone]

## 2020-11-12 DIAGNOSIS — C20 Malignant neoplasm of rectum: Secondary | ICD-10-CM | POA: Diagnosis not present

## 2020-11-12 DIAGNOSIS — K529 Noninfective gastroenteritis and colitis, unspecified: Secondary | ICD-10-CM | POA: Diagnosis not present

## 2021-05-15 DIAGNOSIS — I10 Essential (primary) hypertension: Secondary | ICD-10-CM | POA: Diagnosis not present

## 2021-05-15 DIAGNOSIS — M7989 Other specified soft tissue disorders: Secondary | ICD-10-CM | POA: Diagnosis not present

## 2021-05-15 DIAGNOSIS — T63481A Toxic effect of venom of other arthropod, accidental (unintentional), initial encounter: Secondary | ICD-10-CM | POA: Diagnosis not present

## 2021-06-03 ENCOUNTER — Emergency Department
Admission: EM | Admit: 2021-06-03 | Discharge: 2021-06-03 | Disposition: A | Discharge status: Home / Self Care | Payer: Medicare Other | Attending: Emergency Medicine | Admitting: Emergency Medicine

## 2021-06-03 ENCOUNTER — Emergency Department: Payer: Medicare Other

## 2021-06-03 ENCOUNTER — Other Ambulatory Visit: Payer: Self-pay

## 2021-06-03 DIAGNOSIS — R519 Headache, unspecified: Secondary | ICD-10-CM | POA: Diagnosis not present

## 2021-06-03 DIAGNOSIS — Z85048 Personal history of other malignant neoplasm of rectum, rectosigmoid junction, and anus: Secondary | ICD-10-CM | POA: Diagnosis not present

## 2021-06-03 DIAGNOSIS — R030 Elevated blood-pressure reading, without diagnosis of hypertension: Secondary | ICD-10-CM | POA: Diagnosis present

## 2021-06-03 DIAGNOSIS — Z79899 Other long term (current) drug therapy: Secondary | ICD-10-CM | POA: Diagnosis not present

## 2021-06-03 DIAGNOSIS — I10 Essential (primary) hypertension: Secondary | ICD-10-CM | POA: Insufficient documentation

## 2021-06-03 LAB — CBC
HCT: 47.3 % — ABNORMAL HIGH (ref 36.0–46.0)
Hemoglobin: 15.7 g/dL — ABNORMAL HIGH (ref 12.0–15.0)
MCH: 30.1 pg (ref 26.0–34.0)
MCHC: 33.2 g/dL (ref 30.0–36.0)
MCV: 90.8 fL (ref 80.0–100.0)
Platelets: 326 10*3/uL (ref 150–400)
RBC: 5.21 MIL/uL — ABNORMAL HIGH (ref 3.87–5.11)
RDW: 14.6 % (ref 11.5–15.5)
WBC: 6 10*3/uL (ref 4.0–10.5)
nRBC: 0 % (ref 0.0–0.2)

## 2021-06-03 LAB — BASIC METABOLIC PANEL
Anion gap: 4 — ABNORMAL LOW (ref 5–15)
BUN: 22 mg/dL (ref 8–23)
CO2: 27 mmol/L (ref 22–32)
Calcium: 9.8 mg/dL (ref 8.9–10.3)
Chloride: 109 mmol/L (ref 98–111)
Creatinine, Ser: 0.51 mg/dL (ref 0.44–1.00)
GFR, Estimated: >60 mL/min (ref >60)
Glucose, Bld: 98 mg/dL (ref 70–99)
Potassium: 3.9 mmol/L (ref 3.5–5.1)
Sodium: 140 mmol/L (ref 135–145)

## 2021-06-03 LAB — TROPONIN I (HIGH SENSITIVITY)
Troponin I (High Sensitivity): 5 ng/L (ref <18)
Troponin I (High Sensitivity): 7 ng/L (ref <18)

## 2021-06-03 MED ORDER — IBUPROFEN 400 MG PO TABS
400.0000 mg | ORAL_TABLET | Freq: Once | ORAL | Status: AC
  Administered 2021-06-03: 400 mg via ORAL
  Filled 2021-06-03: qty 1

## 2021-06-03 MED ORDER — DILTIAZEM HCL ER 240 MG PO CP24: 240.0000 mg | ORAL_CAPSULE | Freq: Every day | ORAL | 0 refills | Status: DC

## 2021-06-03 MED ORDER — DILTIAZEM HCL ER COATED BEADS 120 MG PO CP24
120.0000 mg | ORAL_CAPSULE | Freq: Once | ORAL | Status: DC
  Filled 2021-06-03: qty 1

## 2021-06-03 NOTE — Discharge Instructions (Addendum)
Start taking the diltiazem 240 mg tablets  I've prescribed an additional 30 day course if needed, but you can take your old meds if they are not expired  Start taking your BP more regularly at home, and follow-up with your doctor in the next 1-2 weeks

## 2021-06-03 NOTE — ED Triage Notes (Signed)
Pt to ED for hypertension, states she has had a headache.  Reports she has not been taking medications because she does not think she needs them.  Denies blurred vision or cp

## 2021-06-03 NOTE — ED Provider Notes (Signed)
[facility] Emergency Department Provider Note  ____________________________________________   Event Date/Time   First MD Initiated Contact with Patient 06/03/21 1343     (approximate)  I have reviewed the triage vital signs and the nursing notes.   HISTORY  Chief Complaint Hypertension    HPI [name] is a 87 y.o. female with past medical history as below here with hypertension.  The patient states that she has been diagnosed with hypertension but does not take anything because she did not feel like she needed it.  She was previously on diltiazem 240 and had tolerated it well.  She reports that she had a mild, generalized, headache this morning.  She checked her blood pressure and it was 200s over 120s so she came to the ED.  Reports her headache has been fairly constant since then.  It was mild in onset and has progressively improved.  She denies any vision changes.  Denies any focal numbness or weakness.  No chest pain.  No shortness of breath.  No recent changes in health or over-the-counter medication use.  No other complaints.  She states she stopped the diltiazem because she just felt like she did not need it, but has been tolerating it fine.  No leg swelling.    Past Medical History:  Diagnosis Date   Allergic rhinitis, cause unspecified    Diarrhea    Edema    History of colon cancer    s/p surgery, chemotherapy   Internal hemorrhoids without mention of complication    Malignant neoplasm of rectum (HCC)    Osteoarthrosis, unspecified whether generalized or localized, unspecified site    Osteoporosis, unspecified    Other acute reactions to stress    Other and unspecified hyperlipidemia    Other screening mammogram    Pain in limb    Special screening for osteoporosis    Ulceration of vulva, unspecified    Unspecified essential hypertension     Patient Active Problem List   Diagnosis Date Noted   Right-sided low back pain without  sciatica 01/25/2019   Anterior neck pain 07/23/2016   Peripheral edema 07/23/2016   Hypokalemia 01/27/2016   Abnormal weight loss 01/27/2016   Counseling regarding end of life decision making 01/24/2015   Vitamin D deficiency 12/12/2012   Chronic diarrhea 10/27/2009   RECTAL CANCER 07/23/2009   ALLERGIC RHINITIS 01/16/2009   HEMORRHOIDS, INTERNAL 11/20/2008   ANXIETY, SITUATIONAL 05/31/2007   Hyperlipidemia 05/30/2007   Essential hypertension, benign 05/30/2007   Osteoarthritis of knees, bilateral 05/30/2007   At high risk for osteoporosis, no DEXA 05/30/2007    Past Surgical History:  Procedure Laterality Date   dental implants     PARTIAL COLECTOMY  9/10   temporary colostomy    Prior to Admission medications   Medication Sig Start Date End Date Taking? Authorizing Provider  diltiazem (DILACOR XR) 240 MG 24 hr capsule Take 1 capsule (240 mg total) by mouth daily. 06/03/21 07/03/21 Yes Duffy Bruce, MD  chlorthalidone (HYGROTON) 25 MG tablet Take 1 tablet (25 mg total) by mouth daily. 07/18/19   Bedsole, Amy E, MD  Cholecalciferol (VITAMIN D PO) Take 5,000 Units by mouth daily.    [provider]  colestipol (COLESTID) 1 G tablet Take 2 g by mouth 3 (three) times daily.     [provider]  cyclobenzaprine (FLEXERIL) 10 MG tablet Take 0.5-1 tablets (5-10 mg total) by mouth at bedtime as needed for muscle spasms. Patient not taking:  Reported on 03/03/2020 01/25/19   Jinny Sanders, MD  diltiazem (CARDIZEM CD) 240 MG 24 hr capsule Take 1 capsule (240 mg total) by mouth daily. 07/16/19   Bedsole, Amy E, MD  glucosamine-chondroitin 500-400 MG tablet Take 2 tablets by mouth daily.     [provider]  Opium 10 MG/ML (1%) TINC Please take 0.2-0.4 mL three times per day 12/25/18   [provider]  paregoric 2 MG/5ML solution Take 5 mLs by mouth as needed for diarrhea or loose stools.    [provider]    Allergies Patient has no known  allergies.  Family History  Problem Relation Age of Onset   Stroke Mother    Arthritis Mother    Arthritis Brother    Depression Sister     Social History Social History   Tobacco Use   Smoking status: Never   Smokeless tobacco: Never  Vaping Use   Vaping Use: Never used  Substance Use Topics   Alcohol use: No    Alcohol/week: 0.0 standard drinks   Drug use: Yes    Types: Opium    Review of Systems  Review of Systems  Constitutional:  Negative for chills and fever.  HENT:  Negative for sore throat.   Respiratory:  Negative for shortness of breath.   Cardiovascular:  Negative for chest pain.  Gastrointestinal:  Negative for abdominal pain.  Genitourinary:  Negative for flank pain.  Musculoskeletal:  Negative for neck pain.  Skin:  Negative for rash and wound.  Allergic/Immunologic: Negative for immunocompromised state.  Neurological:  Positive for headaches. Negative for weakness and numbness.  Hematological:  Does not bruise/bleed easily.    ____________________________________________  PHYSICAL EXAM:      VITAL SIGNS: ED Triage Vitals  Enc Vitals Group     BP 06/03/21 1102 (!) 206/104     Pulse Rate 06/03/21 1102 88     Resp 06/03/21 1102 18     Temp 06/03/21 1102 98.1 F (36.7 C)     Temp Source 06/03/21 1102 Oral     SpO2 06/03/21 1102 98 %     Weight 06/03/21 1103 105 lb (47.6 kg)     Height 06/03/21 1103 '5\' 4"'$  (1.626 m)     Head Circumference --      Peak Flow --      Pain Score 06/03/21 1102 4     Pain Loc --      Pain Edu? --      Excl. in [place]? --      Physical Exam Vitals and nursing note reviewed.  Constitutional:      General: She is not in acute distress.    Appearance: She is well-developed.  HENT:     Head: Normocephalic and atraumatic.  Eyes:     Conjunctiva/sclera: Conjunctivae normal.  Cardiovascular:     Rate and Rhythm: Normal rate and regular rhythm.     Heart sounds: Normal heart sounds.  Pulmonary:     Effort: Pulmonary  effort is normal. No respiratory distress.     Breath sounds: No wheezing.  Abdominal:     General: There is no distension.  Musculoskeletal:     Cervical back: Neck supple.  Skin:    General: Skin is warm.     Capillary Refill: Capillary refill takes less than 2 seconds.     Findings: No rash.  Neurological:     Mental Status: She is alert and oriented to person, place, and time.  Motor: No abnormal muscle tone.     Neurological Exam:  Mental Status: Alert and oriented to person, place, and time. Attention and concentration normal. Speech clear. Recent memory is intact. Cranial Nerves: Visual fields grossly intact. EOMI and PERRLA. No nystagmus noted. Facial sensation intact at forehead, maxillary cheek, and chin/mandible bilaterally. No facial asymmetry or weakness. Hearing grossly normal. Uvula is midline, and palate elevates symmetrically. Normal SCM and trapezius strength. Tongue midline without fasciculations. Motor: Muscle strength 5/5 in proximal and distal UE and LE bilaterally. No pronator drift. Muscle tone normal. Sensation: Intact to light touch in upper and lower extremities distally bilaterally.  Gait: Normal Coordination: Normal FTN bilaterally.   ____________________________________________   LABS (all labs ordered are listed, but only abnormal results are displayed)  Labs Reviewed  CBC - Abnormal; Notable for the following components:      Result Value   RBC 5.21 (*)    Hemoglobin 15.7 (*)    HCT 47.3 (*)    All other components within normal limits  BASIC METABOLIC PANEL - Abnormal; Notable for the following components:   Anion gap 4 (*)    All other components within normal limits  TROPONIN I (HIGH SENSITIVITY)  TROPONIN I (HIGH SENSITIVITY)    ____________________________________________  EKG: Normal sinus rhythm, ventricular rate 82.  PR 170, QRS 78, QTc 404.  No acute ST elevations or depressions.  No acute evidence of acute ischemia or  infarct. ________________________________________  RADIOLOGY All imaging, including plain films, CT scans, and ultrasounds, independently reviewed by me, and interpretations confirmed via formal radiology reads.  ED MD interpretation:   CT head: No acute intracranial abnormality  Official radiology report(s): CT HEAD WO CONTRAST (5MM)  Result Date: 06/03/2021 CLINICAL DATA:  Headache, chronic, new features or increased frequency EXAM: CT HEAD WITHOUT CONTRAST TECHNIQUE: Contiguous axial images were obtained from the base of the skull through the vertex without intravenous contrast. COMPARISON:  None. FINDINGS: Brain: There is no acute intracranial hemorrhage, mass effect, or edema. Gray-white differentiation is preserved. There is no extra-axial fluid collection. Patchy hypoattenuation in the supratentorial white matter is nonspecific but probably reflects moderate chronic microvascular ischemic changes. Prominence of the ventricles and sulci reflects generalized parenchymal volume loss. Vascular: There is atherosclerotic calcification at the skull base. Skull: Calvarium is unremarkable. Sinuses/Orbits: No acute finding. Other: None. IMPRESSION: No acute intracranial abnormality. Chronic microvascular ischemic changes. Electronically Signed   By: Macy Mis M.D.   On: 06/03/2021 12:48    ____________________________________________  PROCEDURES   Procedure(s) performed (including Critical Care):  Procedures  ____________________________________________  INITIAL IMPRESSION / MDM / [place] / ED COURSE  As part of my medical decision making, I reviewed the following data within the [facility] notes reviewed and incorporated, Old chart reviewed, Notes from prior ED visits, and  Controlled Substance Database       *[name] was evaluated in Emergency Department on 06/03/2021 for the symptoms described in the history of present illness. She was  evaluated in the context of the global COVID-19 pandemic, which necessitated consideration that the patient might be at risk for infection with the SARS-CoV-2 virus that causes COVID-19. Institutional protocols and algorithms that pertain to the evaluation of patients at risk for COVID-19 are in a state of rapid change based on information released by regulatory bodies including the CDC and federal and state organizations. These policies and algorithms were followed during the patient's care in the ED.  Some  ED evaluations and interventions may be delayed as a result of limited staffing during the pandemic.*     Medical Decision Making:  86 yo F here with mild headache in setting of HTN. Pt has a h/o HTN but has not been taking medications. Re: HA - no red flags noted. Neuro exam is unremarkable. CT head obtained, reviewed, shows no signs of bleed or other abnormality. She is awake, alert, no signs of HTN encephalopathy. Renal function, trop, EKG al reassuring with no signs of HTN emergency. BP improving with reassurance and she had taken her PO dilt prior to arrival. I discussed importance of restarting and adhering to her diltiazem, otherwise no apparent emergent medical condition. Will d/c home.  ____________________________________________  FINAL CLINICAL IMPRESSION(S) / ED DIAGNOSES  Final diagnoses:  Primary hypertension     MEDICATIONS GIVEN DURING THIS VISIT:  Medications  ibuprofen (ADVIL) tablet 400 mg (400 mg Oral Given 06/03/21 1458)     ED Discharge Orders          Ordered    diltiazem (DILACOR XR) 240 MG 24 hr capsule  Daily        06/03/21 1443             Note:  This document was prepared using Dragon voice recognition software and may include unintentional dictation errors.   Duffy Bruce, MD 06/03/21 [id number]

## 2021-06-13 ENCOUNTER — Observation Stay: Payer: Medicare Other

## 2021-06-13 ENCOUNTER — Emergency Department: Payer: Medicare Other

## 2021-06-13 ENCOUNTER — Other Ambulatory Visit: Payer: Self-pay

## 2021-06-13 ENCOUNTER — Observation Stay
Admission: EM | Admit: 2021-06-13 | Discharge: 2021-06-16 | Disposition: A | Discharge status: Home / Self Care | Payer: Medicare Other | Attending: Internal Medicine | Admitting: Internal Medicine

## 2021-06-13 DIAGNOSIS — I6529 Occlusion and stenosis of unspecified carotid artery: Secondary | ICD-10-CM | POA: Diagnosis not present

## 2021-06-13 DIAGNOSIS — I10 Essential (primary) hypertension: Secondary | ICD-10-CM | POA: Diagnosis not present

## 2021-06-13 DIAGNOSIS — R42 Dizziness and giddiness: Principal | ICD-10-CM | POA: Insufficient documentation

## 2021-06-13 DIAGNOSIS — Z85038 Personal history of other malignant neoplasm of large intestine: Secondary | ICD-10-CM | POA: Insufficient documentation

## 2021-06-13 DIAGNOSIS — Z20822 Contact with and (suspected) exposure to covid-19: Secondary | ICD-10-CM | POA: Insufficient documentation

## 2021-06-13 DIAGNOSIS — R29818 Other symptoms and signs involving the nervous system: Secondary | ICD-10-CM | POA: Diagnosis not present

## 2021-06-13 DIAGNOSIS — G9389 Other specified disorders of brain: Secondary | ICD-10-CM | POA: Diagnosis not present

## 2021-06-13 DIAGNOSIS — Z85048 Personal history of other malignant neoplasm of rectum, rectosigmoid junction, and anus: Secondary | ICD-10-CM | POA: Diagnosis not present

## 2021-06-13 DIAGNOSIS — Z743 Need for continuous supervision: Secondary | ICD-10-CM | POA: Diagnosis not present

## 2021-06-13 DIAGNOSIS — R6889 Other general symptoms and signs: Secondary | ICD-10-CM | POA: Diagnosis not present

## 2021-06-13 DIAGNOSIS — R930 Abnormal findings on diagnostic imaging of skull and head, not elsewhere classified: Secondary | ICD-10-CM | POA: Diagnosis not present

## 2021-06-13 DIAGNOSIS — Z79899 Other long term (current) drug therapy: Secondary | ICD-10-CM | POA: Insufficient documentation

## 2021-06-13 DIAGNOSIS — R404 Transient alteration of awareness: Secondary | ICD-10-CM | POA: Diagnosis not present

## 2021-06-13 DIAGNOSIS — Z8673 Personal history of transient ischemic attack (TIA), and cerebral infarction without residual deficits: Secondary | ICD-10-CM | POA: Diagnosis not present

## 2021-06-13 DIAGNOSIS — I499 Cardiac arrhythmia, unspecified: Secondary | ICD-10-CM | POA: Diagnosis not present

## 2021-06-13 LAB — HEMOGLOBIN A1C
Hgb A1c MFr Bld: 5.2 % (ref 4.8–5.6)
Mean Plasma Glucose: 102.54 mg/dL

## 2021-06-13 LAB — URINALYSIS, COMPLETE (UACMP) WITH MICROSCOPIC
Bilirubin Urine: NEGATIVE
Glucose, UA: NEGATIVE mg/dL
Hgb urine dipstick: NEGATIVE
Ketones, ur: NEGATIVE mg/dL
Leukocytes,Ua: NEGATIVE
Nitrite: NEGATIVE
Protein, ur: NEGATIVE mg/dL
RBC / HPF: NONE SEEN RBC/hpf (ref 0–5)
Specific Gravity, Urine: 1.015 (ref 1.005–1.030)
WBC, UA: NONE SEEN WBC/hpf (ref 0–5)
pH: 7 (ref 5.0–8.0)

## 2021-06-13 LAB — BASIC METABOLIC PANEL
Anion gap: 10 (ref 5–15)
BUN: 17 mg/dL (ref 8–23)
CO2: 26 mmol/L (ref 22–32)
Calcium: 9.8 mg/dL (ref 8.9–10.3)
Chloride: 101 mmol/L (ref 98–111)
Creatinine, Ser: 0.58 mg/dL (ref 0.44–1.00)
GFR, Estimated: >60 mL/min (ref >60)
Glucose, Bld: 113 mg/dL — ABNORMAL HIGH (ref 70–99)
Potassium: 4.4 mmol/L (ref 3.5–5.1)
Sodium: 137 mmol/L (ref 135–145)

## 2021-06-13 LAB — LDL CHOLESTEROL, DIRECT: Direct LDL: 57.8 mg/dL (ref 0–99)

## 2021-06-13 LAB — CBC
HCT: 47.7 % — ABNORMAL HIGH (ref 36.0–46.0)
Hemoglobin: 16 g/dL — ABNORMAL HIGH (ref 12.0–15.0)
MCH: 30.7 pg (ref 26.0–34.0)
MCHC: 33.5 g/dL (ref 30.0–36.0)
MCV: 91.4 fL (ref 80.0–100.0)
Platelets: 293 10*3/uL (ref 150–400)
RBC: 5.22 MIL/uL — ABNORMAL HIGH (ref 3.87–5.11)
RDW: 14.7 % (ref 11.5–15.5)
WBC: 6.1 10*3/uL (ref 4.0–10.5)
nRBC: 0 % (ref 0.0–0.2)

## 2021-06-13 LAB — TROPONIN I (HIGH SENSITIVITY): Troponin I (High Sensitivity): 5 ng/L (ref <18)

## 2021-06-13 LAB — RESP PANEL BY RT-PCR (FLU A&B, COVID) ARPGX2
Influenza A by PCR: NEGATIVE
Influenza B by PCR: NEGATIVE
SARS Coronavirus 2 by RT PCR: NEGATIVE

## 2021-06-13 MED ORDER — VITAMIN D 25 MCG (1000 UNIT) PO TABS
5000.0000 [IU] | ORAL_TABLET | Freq: Every day | ORAL | Status: DC
  Administered 2021-06-14 – 2021-06-16 (×3): 5000 [IU] via ORAL
  Filled 2021-06-13 (×3): qty 5

## 2021-06-13 MED ORDER — STROKE: EARLY STAGES OF RECOVERY BOOK: Freq: Once | Status: DC

## 2021-06-13 MED ORDER — COLESTIPOL HCL 1 G PO TABS
2.0000 g | ORAL_TABLET | Freq: Three times a day (TID) | ORAL | Status: DC
  Administered 2021-06-13 – 2021-06-16 (×8): 2 g via ORAL
  Filled 2021-06-13 (×10): qty 2

## 2021-06-13 MED ORDER — DILTIAZEM HCL ER COATED BEADS 240 MG PO CP24
240.0000 mg | ORAL_CAPSULE | Freq: Every day | ORAL | Status: DC
  Administered 2021-06-13 – 2021-06-16 (×4): 240 mg via ORAL
  Filled 2021-06-13 (×4): qty 1

## 2021-06-13 MED ORDER — MECLIZINE HCL 25 MG PO TABS
25.0000 mg | ORAL_TABLET | Freq: Three times a day (TID) | ORAL | Status: DC
  Administered 2021-06-13 – 2021-06-16 (×8): 25 mg via ORAL
  Filled 2021-06-13 (×10): qty 1

## 2021-06-13 MED ORDER — ASPIRIN 325 MG PO TABS: 325.0000 mg | ORAL_TABLET | Freq: Every day | ORAL | Status: DC

## 2021-06-13 MED ORDER — GADOBUTROL 1 MMOL/ML IV SOLN
5.0000 mL | Freq: Once | INTRAVENOUS | Status: AC | PRN
  Administered 2021-06-13: 5 mL via INTRAVENOUS
  Filled 2021-06-13: qty 6

## 2021-06-13 MED ORDER — CHLORTHALIDONE 25 MG PO TABS
25.0000 mg | ORAL_TABLET | Freq: Every day | ORAL | Status: DC
  Administered 2021-06-13 – 2021-06-14 (×2): 25 mg via ORAL
  Filled 2021-06-13 (×2): qty 1

## 2021-06-13 MED ORDER — IBUPROFEN 400 MG PO TABS
400.0000 mg | ORAL_TABLET | Freq: Once | ORAL | Status: AC
  Administered 2021-06-13: 400 mg via ORAL
  Filled 2021-06-13: qty 1

## 2021-06-13 MED ORDER — ASPIRIN EC 81 MG PO TBEC
81.0000 mg | DELAYED_RELEASE_TABLET | Freq: Every day | ORAL | Status: DC
  Administered 2021-06-14 – 2021-06-16 (×3): 81 mg via ORAL
  Filled 2021-06-13 (×3): qty 1

## 2021-06-13 MED ORDER — PAREGORIC 2 MG/5ML PO TINC: 5.0000 mL | ORAL | Status: DC | PRN

## 2021-06-13 MED ORDER — ASPIRIN 300 MG RE SUPP: 300.0000 mg | Freq: Every day | RECTAL | Status: DC

## 2021-06-13 MED ORDER — MECLIZINE HCL 25 MG PO TABS
25.0000 mg | ORAL_TABLET | Freq: Once | ORAL | Status: AC
  Administered 2021-06-13: 25 mg via ORAL
  Filled 2021-06-13: qty 1

## 2021-06-13 MED ORDER — LABETALOL HCL 5 MG/ML IV SOLN: 10.0000 mg | Freq: Four times a day (QID) | INTRAVENOUS | Status: DC | PRN

## 2021-06-13 MED ORDER — ACETAMINOPHEN 325 MG PO TABS
650.0000 mg | ORAL_TABLET | ORAL | Status: DC | PRN
  Administered 2021-06-14: 13:00:00 650 mg via ORAL
  Filled 2021-06-13 (×2): qty 2

## 2021-06-13 MED ORDER — ACETAMINOPHEN 160 MG/5ML PO SOLN
650.0000 mg | ORAL | Status: DC | PRN
  Filled 2021-06-13: qty 20.3

## 2021-06-13 MED ORDER — ASPIRIN 325 MG PO TABS
325.0000 mg | ORAL_TABLET | Freq: Once | ORAL | Status: AC
  Administered 2021-06-13: 325 mg via ORAL
  Filled 2021-06-13: qty 1

## 2021-06-13 MED ORDER — ACETAMINOPHEN 650 MG RE SUPP: 650.0000 mg | RECTAL | Status: DC | PRN

## 2021-06-13 MED ORDER — OPIUM 10 MG/ML (1%) PO TINC: 0.2000 mL | Freq: Three times a day (TID) | ORAL | Status: DC

## 2021-06-13 NOTE — ED Notes (Signed)
Patient transported to MRI 

## 2021-06-13 NOTE — ED Triage Notes (Signed)
See first nurse note. States dizziness started last night before bed.

## 2021-06-13 NOTE — ED Provider Notes (Signed)
[facility] Emergency Department Provider Note  ____________________________________________   Event Date/Time   First MD Initiated Contact with Patient 06/13/21 1045     (approximate)  I have reviewed the triage vital signs and the nursing notes.   HISTORY  Chief Complaint Dizziness and Hypertension    HPI [name] is a 87 y.o. female with hypertension who comes in with concerns for dizziness.  Patient reports that she had not taken her diltiazem for years.  She states that she does not even remember tolerating it previously.  She states that she was instructed to start taking it 10 days ago and that she has been doing that.  She denies any headaches or falls.  She states that today around 5 AM she woke up and she had sudden onset of severe dizziness that was worse with movement and around, better at rest.  Denies any chest pain, shortness of breath or any other symptoms.  States that when she walks she has to hold onto things which is abnormal for her.  Normally she can walk without any assistance.  She denies any ringing in her ears.  She describes it as feeling like she is spinning/drunk.   On review of records patient was seen on 8/10 for elevated blood pressure.  Patient was previously on diltiazem 240 and had tolerated it.  Patient had come in with a headache at that time and had CT imaging that was negative.  She also had kidney function, troponin, EKG done that were all reassuring with no evidence of hypertensive emergency.  Patient was instructed continue her diltiazem at home.           Past Medical History:  Diagnosis Date   Allergic rhinitis, cause unspecified    Diarrhea    Edema    History of colon cancer    s/p surgery, chemotherapy   Internal hemorrhoids without mention of complication    Malignant neoplasm of rectum (HCC)    Osteoarthrosis, unspecified whether generalized or localized, unspecified site    Osteoporosis,  unspecified    Other acute reactions to stress    Other and unspecified hyperlipidemia    Other screening mammogram    Pain in limb    Special screening for osteoporosis    Ulceration of vulva, unspecified    Unspecified essential hypertension     Patient Active Problem List   Diagnosis Date Noted   Right-sided low back pain without sciatica 01/25/2019   Anterior neck pain 07/23/2016   Peripheral edema 07/23/2016   Hypokalemia 01/27/2016   Abnormal weight loss 01/27/2016   Counseling regarding end of life decision making 01/24/2015   Vitamin D deficiency 12/12/2012   Chronic diarrhea 10/27/2009   RECTAL CANCER 07/23/2009   ALLERGIC RHINITIS 01/16/2009   HEMORRHOIDS, INTERNAL 11/20/2008   ANXIETY, SITUATIONAL 05/31/2007   Hyperlipidemia 05/30/2007   Essential hypertension, benign 05/30/2007   Osteoarthritis of knees, bilateral 05/30/2007   At high risk for osteoporosis, no DEXA 05/30/2007    Past Surgical History:  Procedure Laterality Date   dental implants     PARTIAL COLECTOMY  9/10   temporary colostomy    Prior to Admission medications   Medication Sig Start Date End Date Taking? Authorizing Provider  chlorthalidone (HYGROTON) 25 MG tablet Take 1 tablet (25 mg total) by mouth daily. 07/18/19   Bedsole, Amy E, MD  Cholecalciferol (VITAMIN D PO) Take 5,000 Units by mouth daily.    [provider]  colestipol (COLESTID)  1 G tablet Take 2 g by mouth 3 (three) times daily.     [provider]  cyclobenzaprine (FLEXERIL) 10 MG tablet Take 0.5-1 tablets (5-10 mg total) by mouth at bedtime as needed for muscle spasms. Patient not taking: Reported on 03/03/2020 01/25/19   Jinny Sanders, MD  diltiazem (CARDIZEM CD) 240 MG 24 hr capsule Take 1 capsule (240 mg total) by mouth daily. 07/16/19   Bedsole, Amy E, MD  diltiazem (DILACOR XR) 240 MG 24 hr capsule Take 1 capsule (240 mg total) by mouth daily. 06/03/21 07/03/21  Duffy Bruce, MD  glucosamine-chondroitin  500-400 MG tablet Take 2 tablets by mouth daily.     [provider]  Opium 10 MG/ML (1%) TINC Please take 0.2-0.4 mL three times per day 12/25/18   [provider]  paregoric 2 MG/5ML solution Take 5 mLs by mouth as needed for diarrhea or loose stools.    [provider]    Allergies Patient has no known allergies.  Family History  Problem Relation Age of Onset   Stroke Mother    Arthritis Mother    Arthritis Brother    Depression Sister     Social History Social History   Tobacco Use   Smoking status: Never   Smokeless tobacco: Never  Vaping Use   Vaping Use: Never used  Substance Use Topics   Alcohol use: No    Alcohol/week: 0.0 standard drinks   Drug use: Yes    Types: Opium      Review of Systems Constitutional: No fever/chills, dizziness Eyes: No visual changes. ENT: No sore throat. Cardiovascular: Denies chest pain. Respiratory: Denies shortness of breath. Gastrointestinal: No abdominal pain.  No nausea, no vomiting.  No diarrhea.  No constipation. Genitourinary: Negative for dysuria. Musculoskeletal: Negative for back pain. Skin: Negative for rash. Neurological: Negative for headaches, focal weakness or numbness. All other ROS negative ____________________________________________   PHYSICAL EXAM:  VITAL SIGNS: ED Triage Vitals  Enc Vitals Group     BP 06/13/21 0918 (!) 194/100     Pulse Rate 06/13/21 0918 88     Resp 06/13/21 0918 20     Temp 06/13/21 0918 98.3 F (36.8 C)     Temp Source 06/13/21 0918 Oral     SpO2 06/13/21 0918 98 %     Weight 06/13/21 0918 100 lb (45.4 kg)     Height 06/13/21 0918 '5\' 4"'$  (1.626 m)     Head Circumference --      Peak Flow --      Pain Score 06/13/21 0921 0     Pain Loc --      Pain Edu? --      Excl. in [place]? --     Constitutional: Alert and oriented. Well appearing and in no acute distress. Eyes: Conjunctivae are normal. EOMI. Head: Atraumatic. Nose: No  congestion/rhinnorhea. Mouth/Throat: Mucous membranes are moist.   Neck: No stridor. Trachea Midline. FROM Cardiovascular: Normal rate, regular rhythm. Grossly normal heart sounds.  Good peripheral circulation. Respiratory: Normal respiratory effort.  No retractions. Lungs CTAB. Gastrointestinal: Soft and nontender. No distention. No abdominal bruits.  Musculoskeletal: No lower extremity tenderness nor edema.  No joint effusions. Neurologic:  Normal speech and language. No gross focal neurologic deficits are appreciated.  Finger-nose intact.  Cranial nerves II through XII are intact.  Equal strength in arms and legs.  Patient has difficulty with ambulation but has a negative Romberg test Skin:  Skin is warm, dry and  intact. No rash noted. Psychiatric: Mood and affect are normal. Speech and behavior are normal. GU: Deferred   ____________________________________________   LABS (all labs ordered are listed, but only abnormal results are displayed)  Labs Reviewed  BASIC METABOLIC PANEL - Abnormal; Notable for the following components:      Result Value   Glucose, Bld 113 (*)    All other components within normal limits  CBC - Abnormal; Notable for the following components:   RBC 5.22 (*)    Hemoglobin 16.0 (*)    HCT 47.7 (*)    All other components within normal limits  URINALYSIS, COMPLETE (UACMP) WITH MICROSCOPIC  CBG MONITORING, ED  TROPONIN I (HIGH SENSITIVITY)   ____________________________________________   ED ECG REPORT I, Vanessa Burke, the attending physician, personally viewed and interpreted this ECG.  Normal sinus rate of 89, no ST elevation, no T wave inversions, normal intervals ____________________________________________  RADIOLOGY   Official radiology report(s): MR ANGIO HEAD WO CONTRAST  Result Date: 06/13/2021 CLINICAL DATA:  Neuro deficit, acute, stroke suspected EXAM: MRA HEAD WITHOUT CONTRAST TECHNIQUE: Angiographic images of the Circle of Willis were  acquired using MRA technique without intravenous contrast. COMPARISON:  No pertinent prior exam. FINDINGS: POSTERIOR CIRCULATION: --Vertebral arteries: Normal --Inferior cerebellar arteries: Normal. --Basilar artery: Normal. --Superior cerebellar arteries: Normal. --Posterior cerebral arteries: Normal. ANTERIOR CIRCULATION: --Intracranial internal carotid arteries: Normal. --Anterior cerebral arteries (ACA): Normal. --Middle cerebral arteries (MCA): Normal. ANATOMIC VARIANTS: None IMPRESSION: Normal intracranial MRA. Electronically Signed   By: Ulyses Jarred M.D.   On: 06/13/2021 22:17   MR ANGIO NECK W WO CONTRAST  Result Date: 06/13/2021 CLINICAL DATA:  Carotid artery stenosis EXAM: MRA NECK WITHOUT AND WITH CONTRAST TECHNIQUE: Multiplanar and multiecho pulse sequences of the neck were obtained without and with intravenous contrast. Angiographic images of the neck were obtained using MRA technique without and with intravenous contrast. CONTRAST:  23m GADAVIST GADOBUTROL 1 MMOL/ML IV SOLN COMPARISON:  None. FINDINGS: Normal proximal arch vessels, carotid systems and vertebral arteries. IMPRESSION: Normal MRA of the neck. Electronically Signed   By: KUlyses JarredM.D.   On: 06/13/2021 22:22   MR BRAIN WO CONTRAST  Result Date: 06/13/2021 CLINICAL DATA:  Dizziness, nonspecific EXAM: MRI HEAD WITHOUT CONTRAST TECHNIQUE: Multiplanar, multiecho pulse sequences of the brain and surrounding structures were obtained without intravenous contrast. COMPARISON:  None. FINDINGS: Brain: There is no acute infarction or intracranial hemorrhage. There is no intracranial mass, mass effect, or edema. There is no hydrocephalus or extra-axial fluid collection. Patchy and confluent areas of T2 hyperintensity in the supratentorial and pontine white matter are nonspecific but probably reflect moderate chronic microvascular ischemic changes. Small chronic right parietal cortical infarct. Small chronic right cerebellar infarct.  Prominence of the ventricles and sulci reflects parenchymal volume loss. Vascular: Major vessel flow voids at the skull base are preserved. Skull and upper cervical spine: Nonspecific T2 hyperintense lesion of the right frontal calvarium. No aggressive features on the prior CT. Marrow signal is otherwise within normal limits. Sinuses/Orbits: Paranasal sinuses are aerated. Orbits are unremarkable. Other: Sella is unremarkable.  Mastoid air cells are clear. IMPRESSION: No evidence of recent infarction, hemorrhage, or mass. Moderate chronic microvascular ischemic changes. 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 Right frontal calvarium lesion. No aggressive features on the prior head CT but remains indeterminate given history of malignancy. Electronically Signed   By: PMacy MisM.D.   On: 06/13/2021 12:47   MR BRAIN W CONTRAST  Result Date: 06/13/2021 CLINICAL  DATA:  Calvarial lesion EXAM: MRI HEAD WITH CONTRAST TECHNIQUE: Multiplanar, multiecho pulse sequences of the brain and surrounding structures were obtained with intravenous contrast. CONTRAST:  41m GADAVIST GADOBUTROL 1 MMOL/ML IV SOLN COMPARISON:  Brain MRI 06/13/2021 Head CT 06/03/2021 FINDINGS: Postcontrast images were obtained supplementary to the earlier noncontrast brain MRI. The right frontal calvarium lesion shows moderate contrast enhancement. 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 Electronically Signed   By: KUlyses JarredM.D.   On: 06/13/2021 22:15    ____________________________________________   PROCEDURES  Procedure(s) performed (including Critical Care):  Procedures   ____________________________________________   INITIAL IMPRESSION / ASSESSMENT AND PLAN / ED COURSE  DQuaneisha ApolloHagg was evaluated in Emergency Department on 06/13/2021 for the symptoms described in the history of present illness. She was evaluated  in the context of the global COVID-19 pandemic, which necessitated consideration that the patient might be at risk for infection with the SARS-CoV-2 virus that causes COVID-19. Institutional protocols and algorithms that pertain to the evaluation of patients at risk for COVID-19 are in a state of rapid change based on information released by regulatory bodies including the CDC and federal and state organizations. These policies and algorithms were followed during the patient's care in the ED.    Patient is a 87year old who comes in with dizziness.  We will get orthostatics, basic labs, EKG and cardiac markers evaluate for ACS.  Patient had recent CT head without evidence of mass or intracranial hemorrhage and she reports resolution of headaches.  We will get MRI to make sure no stroke given sudden onset given patient's age and comorbidities but I suspect that this could be related to patient's new medication.   MRI shows old stroke but in similar area that would cause dizzy.  Discussed with neurology who recommend stroke workup.           ____________________________________________   FINAL CLINICAL IMPRESSION(S) / ED DIAGNOSES   Final diagnoses:  Dizziness  History of stroke involving cerebellum      MEDICATIONS GIVEN DURING THIS VISIT:  Medications  aspirin tablet 325 mg (325 mg Oral Given 06/13/21 1942)    Followed by  aspirin EC tablet 81 mg (has no administration in time range)  colestipol (COLESTID) tablet 2 g (2 g Oral Given 06/13/21 1942)  Opium 10 MG/ML (1%) tincture 2 mg (0 mg Oral Hold 06/13/21 2200)  paregoric 2 MG/5ML solution 5 mL (has no administration in time range)  cholecalciferol (VITAMIN D3) tablet 5,000 Units (has no administration in time range)   stroke: mapping our early stages of recovery book (has no administration in time range)  acetaminophen (TYLENOL) tablet 650 mg (has no administration in time range)    Or  acetaminophen (TYLENOL) 160 MG/5ML  solution 650 mg (has no administration in time range)    Or  acetaminophen (TYLENOL) suppository 650 mg (has no administration in time range)  meclizine (ANTIVERT) tablet 25 mg (0 mg Oral Hold 06/13/21 2023)  labetalol (NORMODYNE) injection 10 mg (has no administration in time range)  chlorthalidone (HYGROTON) tablet 25 mg (25 mg Oral Given 06/13/21 1943)  diltiazem (CARDIZEM CD) 24 hr capsule 240 mg (240 mg Oral Given 06/13/21 1944)  meclizine (ANTIVERT) tablet 25 mg (25 mg Oral Given 06/13/21 1105)  ibuprofen (ADVIL) tablet 400 mg (400 mg Oral Given 06/13/21 1423)  gadobutrol (GADAVIST) 1 MMOL/ML injection 5 mL (5 mLs Intravenous Contrast Given 06/13/21 2105)  ED Discharge Orders     None        Note:  This document was prepared using Dragon voice recognition software and may include unintentional dictation errors.    Vanessa Goltry, MD 06/14/21 [phone]

## 2021-06-13 NOTE — ED Notes (Signed)
First nurse note: pt comes ems from home for HTN. Was non-compliant with meds for about 6 months then started back again recently. 191/119. HR 82 NSR. CBG 119. 18G L FA. C/o dizziness with exertion.

## 2021-06-13 NOTE — H&P (Signed)
History and Physical    [name] [id number] DOB: Feb 12, 1933 DOA: 06/13/2021  PCP: Jinny Sanders, MD   Patient coming from: Home  I have personally briefly reviewed patient's old medical records in [place]  Chief Complaint: Dizziness  HPI: [name] is a 87 y.o. female with medical history significant for hypertension who presents to the emergency room for evaluation of sudden onset dizziness that started on the morning of her admission.  Patient states that she was in her usual state of health until this morning when she noticed that she was dizzy.  Dizziness occurs both at rest and is worse when she tries to get position.  She complains of a frontal headache but denies having any blurred vision.  She does not have a history of migraine headaches She denies having any tinnitus or hearing loss.  She denies feeling lightheaded.  She has had no fever or chills.  She denies having any chest pain, no cough, no shortness of breath, no abdominal pain, no urinary symptoms, no focal deficits or blurred vision. Labs show sodium 137, potassium 4.4, chloride 101, bicarb 26, glucose 113, BUN 17, creatinine 0.58, calcium 9.8, white count 6.1, hemoglobin 16, hematocrit 47.7, MCV 91.4, RDW 15.7, platelet count 293 Respiratory viral panel is pending Urinalysis is sterile MRI of the brain without contrast shows moderate chronic microvascular ischemic changes.  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  Right frontal calvarium lesion.  No aggressive features on the prior head CT but remains indeterminate given history of malignancy. Twelve-lead EKG reviewed by me shows normal sinus rhythm.  T wave abnormality consider inferior ischemia   ED Course: Patient is an 87 year old Caucasian female who presents to the ER for evaluation of sudden onset dizziness worse with movement.  She denies having any other associated symptoms. She had an MRI of the brain done in the ER which  showed an old cerebellar infarct. Despite receiving meclizine in the ER she continues to complain of dizziness and will be referred to observation status for further evaluation.    Review of Systems: As per HPI otherwise all other systems reviewed and negative.    Past Medical History:  Diagnosis Date   Allergic rhinitis, cause unspecified    Diarrhea    Edema    History of colon cancer    s/p surgery, chemotherapy   Internal hemorrhoids without mention of complication    Malignant neoplasm of rectum (HCC)    Osteoarthrosis, unspecified whether generalized or localized, unspecified site    Osteoporosis, unspecified    Other acute reactions to stress    Other and unspecified hyperlipidemia    Other screening mammogram    Pain in limb    Special screening for osteoporosis    Ulceration of vulva, unspecified    Unspecified essential hypertension     Past Surgical History:  Procedure Laterality Date   dental implants     PARTIAL COLECTOMY  9/10   temporary colostomy     reports that she has never smoked. She has never used smokeless tobacco. She reports current drug use. Drug: Opium. She reports that she does not drink alcohol.  No Known Allergies  Family History  Problem Relation Age of Onset   Stroke Mother    Arthritis Mother    Arthritis Brother    Depression Sister       Prior to Admission medications   Medication Sig Start Date End Date Taking? Authorizing Provider  chlorthalidone (HYGROTON)  25 MG tablet Take 1 tablet (25 mg total) by mouth daily. 07/18/19   Bedsole, Amy E, MD  Cholecalciferol (VITAMIN D PO) Take 5,000 Units by mouth daily.    [provider]  colestipol (COLESTID) 1 G tablet Take 2 g by mouth 3 (three) times daily.     [provider]  cyclobenzaprine (FLEXERIL) 10 MG tablet Take 0.5-1 tablets (5-10 mg total) by mouth at bedtime as needed for muscle spasms. Patient not taking: Reported on 03/03/2020 01/25/19   Jinny Sanders, MD   diltiazem (CARDIZEM CD) 240 MG 24 hr capsule Take 1 capsule (240 mg total) by mouth daily. 07/16/19   Bedsole, Amy E, MD  diltiazem (DILACOR XR) 240 MG 24 hr capsule Take 1 capsule (240 mg total) by mouth daily. 06/03/21 07/03/21  Duffy Bruce, MD  glucosamine-chondroitin 500-400 MG tablet Take 2 tablets by mouth daily.     [provider]  Opium 10 MG/ML (1%) TINC Please take 0.2-0.4 mL three times per day 12/25/18   [provider]  paregoric 2 MG/5ML solution Take 5 mLs by mouth as needed for diarrhea or loose stools.    [provider]    Physical Exam: Vitals:   06/13/21 1200 06/13/21 1335 06/13/21 1430 06/13/21 1500  BP: (!) 170/95 (!) 171/86 (!) 169/97 (!) 186/97  Pulse: 88 78 84 91  Resp: '15 15 17 18  '$ Temp:      TempSrc:      SpO2: 99% 100% 99% 98%  Weight:      Height:         Vitals:   06/13/21 1200 06/13/21 1335 06/13/21 1430 06/13/21 1500  BP: (!) 170/95 (!) 171/86 (!) 169/97 (!) 186/97  Pulse: 88 78 84 91  Resp: '15 15 17 18  '$ Temp:      TempSrc:      SpO2: 99% 100% 99% 98%  Weight:      Height:          Constitutional: Alert and oriented x 3 . Not in any apparent distress HEENT:      Head: Normocephalic and atraumatic.         Eyes: PERLA, EOMI, Conjunctivae are normal. Sclera is non-icteric.       Mouth/Throat: Mucous membranes are moist.       Neck: Supple with no signs of meningismus. Cardiovascular: Regular rate and rhythm. No murmurs, gallops, or rubs. 2+ symmetrical distal pulses are present . No JVD. No LE edema Respiratory: Respiratory effort normal .Lungs sounds clear bilaterally. No wheezes, crackles, or rhonchi.  Gastrointestinal: Soft, non tender, and non distended with positive bowel sounds.  Genitourinary: No CVA tenderness. Musculoskeletal: Nontender with normal range of motion in all extremities. No cyanosis, or erythema of extremities. Neurologic:  Face is symmetric. Moving all extremities. No gross focal neurologic  deficits.  Able to move all extremities Skin: Skin is warm, dry.  No rash or ulcers Psychiatric: Mood and affect are normal    Labs on Admission: I have personally reviewed following labs and imaging studies  CBC: Recent Labs  Lab 06/13/21 0919  WBC 6.1  HGB 16.0*  HCT 47.7*  MCV 91.4  PLT 0000000   Basic Metabolic Panel: Recent Labs  Lab 06/13/21 0919  NA 137  K 4.4  CL 101  CO2 26  GLUCOSE 113*  BUN 17  CREATININE 0.58  CALCIUM 9.8   GFR: Estimated Creatinine Clearance: 36.2 mL/min (by C-G formula based on SCr of 0.58 mg/dL).  Liver Function Tests: No results for input(s): AST, ALT, ALKPHOS, BILITOT, PROT, ALBUMIN in the last 168 hours. No results for input(s): LIPASE, AMYLASE in the last 168 hours. No results for input(s): AMMONIA in the last 168 hours. Coagulation Profile: No results for input(s): INR, PROTIME in the last 168 hours. Cardiac Enzymes: No results for input(s): CKTOTAL, CKMB, CKMBINDEX, TROPONINI in the last 168 hours. BNP (last 3 results) No results for input(s): PROBNP in the last 8760 hours. HbA1C: No results for input(s): HGBA1C in the last 72 hours. CBG: No results for input(s): GLUCAP in the last 168 hours. Lipid Profile: No results for input(s): CHOL, HDL, LDLCALC, TRIG, CHOLHDL, LDLDIRECT in the last 72 hours. Thyroid Function Tests: No results for input(s): TSH, T4TOTAL, FREET4, T3FREE, THYROIDAB in the last 72 hours. Anemia Panel: No results for input(s): VITAMINB12, FOLATE, FERRITIN, TIBC, IRON, RETICCTPCT in the last 72 hours. Urine analysis:    Component Value Date/Time   COLORURINE YELLOW 06/13/2021 1032   APPEARANCEUR CLEAR 06/13/2021 1032   APPEARANCEUR Hazy 03/31/2013 2230   LABSPEC 1.015 06/13/2021 1032   LABSPEC 1.019 03/31/2013 2230   PHURINE 7.0 06/13/2021 1032   GLUCOSEU NEGATIVE 06/13/2021 1032   GLUCOSEU Negative 03/31/2013 2230   HGBUR NEGATIVE 06/13/2021 1032   HGBUR negative 06/15/2010 1527   BILIRUBINUR NEGATIVE  06/13/2021 1032   BILIRUBINUR Negative 01/24/2019 1216   BILIRUBINUR Negative 03/31/2013 2230   KETONESUR NEGATIVE 06/13/2021 1032   PROTEINUR NEGATIVE 06/13/2021 1032   UROBILINOGEN 0.2 01/24/2019 1216   UROBILINOGEN 0.2 06/15/2010 1527   NITRITE NEGATIVE 06/13/2021 1032   LEUKOCYTESUR NEGATIVE 06/13/2021 1032   LEUKOCYTESUR 2+ 03/31/2013 2230    Radiological Exams on Admission: MR BRAIN WO CONTRAST  Result Date: 06/13/2021 CLINICAL DATA:  Dizziness, nonspecific EXAM: MRI HEAD WITHOUT CONTRAST TECHNIQUE: Multiplanar, multiecho pulse sequences of the brain and surrounding structures were obtained without intravenous contrast. COMPARISON:  None. FINDINGS: Brain: There is no acute infarction or intracranial hemorrhage. There is no intracranial mass, mass effect, or edema. There is no hydrocephalus or extra-axial fluid collection. Patchy and confluent areas of T2 hyperintensity in the supratentorial and pontine white matter are nonspecific but probably reflect moderate chronic microvascular ischemic changes. Small chronic right parietal cortical infarct. Small chronic right cerebellar infarct. Prominence of the ventricles and sulci reflects parenchymal volume loss. Vascular: Major vessel flow voids at the skull base are preserved. Skull and upper cervical spine: Nonspecific T2 hyperintense lesion of the right frontal calvarium. No aggressive features on the prior CT. Marrow signal is otherwise within normal limits. Sinuses/Orbits: Paranasal sinuses are aerated. Orbits are unremarkable. Other: Sella is unremarkable.  Mastoid air cells are clear. IMPRESSION: No evidence of recent infarction, hemorrhage, or mass. Moderate chronic microvascular ischemic changes. 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 Right frontal calvarium lesion. No aggressive features on the prior head CT but remains indeterminate given history of malignancy. Electronically Signed   By: Macy Mis M.D.   On: 06/13/2021  12:47     Assessment/Plan Principal Problem:   Dizziness Active Problems:   Essential hypertension      Dizziness Unclear etiology Rule out possible BPV Imaging shows old cerebellar infarct Will patient on scheduled meclizine Consult neurology for further recommendation Place patient empirically on aspirin for concerns for an acute Request PT/OT consult We will start patient on diazepam if symptoms do not resolve with meclizine   Hypertension Continue chlorthalidone and diltiazem As needed labetalol    DVT prophylaxis: SCD  Code Status: full  code  Family Communication: Greater than 50% of time was spent discussing patient's condition and plan of care with her at the bedside.  All questions and concerns have been addressed.  She verbalizes understanding and agrees with the plan. Disposition Plan: Back to previous home environment Consults called: Neurology Status: Observation    Saahas Hidrogo MD [facility]     06/13/2021, 3:38 PM

## 2021-06-14 DIAGNOSIS — R42 Dizziness and giddiness: Secondary | ICD-10-CM | POA: Diagnosis not present

## 2021-06-14 DIAGNOSIS — Z8673 Personal history of transient ischemic attack (TIA), and cerebral infarction without residual deficits: Secondary | ICD-10-CM | POA: Diagnosis not present

## 2021-06-14 DIAGNOSIS — R930 Abnormal findings on diagnostic imaging of skull and head, not elsewhere classified: Secondary | ICD-10-CM

## 2021-06-14 MED ORDER — KETOROLAC TROMETHAMINE 30 MG/ML IJ SOLN
30.0000 mg | Freq: Once | INTRAMUSCULAR | Status: AC
  Administered 2021-06-14: 30 mg via INTRAVENOUS
  Filled 2021-06-14: qty 1

## 2021-06-14 MED ORDER — HEPARIN SODIUM (PORCINE) 5000 UNIT/ML IJ SOLN
5000.0000 [IU] | Freq: Three times a day (TID) | INTRAMUSCULAR | Status: DC
  Administered 2021-06-14 – 2021-06-16 (×6): 5000 [IU] via SUBCUTANEOUS
  Filled 2021-06-14 (×5): qty 1

## 2021-06-14 NOTE — Progress Notes (Signed)
PROGRESS NOTE                                                                                                                                                                                                             Patient Demographics:    [name], is a 87 y.o. female, DOB - 01-03-1933, [id number]  Outpatient Primary MD for the patient is Laurie Sanders, MD    LOS - 0  Admit date - 06/13/2021    Chief Complaint  Patient presents with   Dizziness   Hypertension       Brief Narrative (HPI from H&P) - [name] is a 87 y.o. female with medical history significant for hypertension who presents to the emergency room for evaluation of sudden onset dizziness that started on the morning of her admission.  Patient states that she was in her usual state of health until this morning when she noticed that she was dizzy mostly upon standing up or standing up, work-up here showing nonacute MRI but with fibrous dysplasia like changes.   Subjective:    [name] today has, No headache, No chest pain, No abdominal pain - No Nausea, No new weakness tingling or numbness, no SOB, mildly dizzy on standing up.   Assessment  & Plan :      Acute onset of positional dizziness upon sitting up and standing up-do not think this is vertigo, there is no nystagmus or ringing in ears, she has no vertigo-like symptoms, this could be orthostatic hypotension question if fibrous dysplasia in her  right frontal calvarium could be causing the symptoms.  Neurology to evaluate, will check evaluation by PT-OT.  She is high fall risk and due to her age will monitor her in the hospital for a day.  Gradually advance activity.  2.  Hypertension.  Continue diltiazem hold diuretic.        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      Disposition Plan  :    Status is: Observation  Dispo: The patient is from: Home  Anticipated d/c is to: Home              Patient currently is not medically stable to d/c.   Difficult to place patient No  DVT Prophylaxis  :    heparin injection 5,000 Units Start: 06/14/21 1400 SCD's Start: 06/13/21 1517    Lab Results  Component Value Date   PLT 293 06/13/2021    Diet :  Diet Order             Diet 2 gram sodium Room service appropriate? Yes; Fluid consistency: Thin  Diet effective now                    Inpatient Medications  Scheduled Meds:   stroke: mapping our early stages of recovery book   Does not apply Once   aspirin EC  81 mg Oral Daily   chlorthalidone  25 mg Oral Daily   cholecalciferol  5,000 Units Oral Daily   colestipol  2 g Oral TID with meals   diltiazem  240 mg Oral Daily   heparin injection (subcutaneous)  5,000 Units Subcutaneous Q8H   meclizine  25 mg Oral TID   Opium  0.2 mL Oral TID   Continuous Infusions: PRN Meds:.acetaminophen **OR** acetaminophen (TYLENOL) oral liquid 160 mg/5 mL **OR** acetaminophen, labetalol, paregoric  Antibiotics  :    Anti-infectives (From admission, onward)    None        Time Spent in minutes  30   [name] M.D on 06/14/2021 at 11:10 AM  To page go to [email or web address]   [facility] -  Office  [phone]    See all Orders from today for further details    Objective:   Vitals:   06/13/21 2142 06/13/21 2347 06/14/21 0454 06/14/21 0750  BP: (!) 167/83 118/63 123/71 (!) 142/77  Pulse: 81 83 64 74  Resp: '18 18 18 16  '$ Temp: 98.2 F (36.8 C) 98 F (36.7 C) (!) 97.5 F (36.4 C) 97.7 F (36.5 C)  TempSrc: Oral     SpO2: 96% 98% 97% 98%  Weight:      Height:        Wt Readings from Last 3 Encounters:  06/13/21 45.4 kg  06/03/21 47.6 kg  10/26/20 49 kg    No intake or  output data in the 24 hours ending 06/14/21 1110   Physical Exam  Awake Alert, No new F.N deficits, Normal affect [place].AT,PERRAL Supple Neck,No JVD, No cervical lymphadenopathy appriciated.  Symmetrical Chest wall movement, Good air movement bilaterally, CTAB RRR,No Gallops,Rubs or new Murmurs, No Parasternal Heave +ve B.Sounds, Abd Soft, No tenderness, No organomegaly appriciated, No rebound - guarding or rigidity. No Cyanosis, Clubbing or edema, No new Rash or bruise      Data Review:    CBC Recent Labs  Lab 06/13/21 0919  WBC 6.1  HGB 16.0*  HCT 47.7*  PLT 293  MCV 91.4  MCH 30.7  MCHC 33.5  RDW 14.7    Recent Labs  Lab 06/13/21 0919 06/13/21 1429  NA 137  --   K 4.4  --   CL 101  --   CO2 26  --   GLUCOSE 113*  --   BUN 17  --   CREATININE 0.58  --   CALCIUM 9.8  --   HGBA1C  --  5.2    ------------------------------------------------------------------------------------------------------------------ Recent Labs    06/13/21 1429  LDLDIRECT 57.8    Lab Results  Component  Value Date   HGBA1C 5.2 06/13/2021   ------------------------------------------------------------------------------------------------------------------ No results for input(s): TSH, T4TOTAL, T3FREE, THYROIDAB in the last 72 hours.  Invalid input(s): FREET3  Cardiac Enzymes No results for input(s): CKMB, TROPONINI, MYOGLOBIN in the last 168 hours.  Invalid input(s): CK ------------------------------------------------------------------------------------------------------------------ No results found for: BNP    Radiology Reports CT HEAD WO CONTRAST (5MM)  Result Date: 06/03/2021 CLINICAL DATA:  Headache, chronic, new features or increased frequency EXAM: CT HEAD WITHOUT CONTRAST TECHNIQUE: Contiguous axial images were obtained from the base of the skull through the vertex without intravenous contrast. COMPARISON:  None. FINDINGS: Brain: There is no acute intracranial hemorrhage,  mass effect, or edema. Gray-white differentiation is preserved. There is no extra-axial fluid collection. Patchy hypoattenuation in the supratentorial white matter is nonspecific but probably reflects moderate chronic microvascular ischemic changes. Prominence of the ventricles and sulci reflects generalized parenchymal volume loss. Vascular: There is atherosclerotic calcification at the skull base. Skull: Calvarium is unremarkable. Sinuses/Orbits: No acute finding. Other: None. IMPRESSION: No acute intracranial abnormality. Chronic microvascular ischemic changes. Electronically Signed   By: Macy Mis M.D.   On: 06/03/2021 12:48   MR ANGIO HEAD WO CONTRAST  Result Date: 06/13/2021 CLINICAL DATA:  Neuro deficit, acute, stroke suspected EXAM: MRA HEAD WITHOUT CONTRAST TECHNIQUE: Angiographic images of the Circle of Willis were acquired using MRA technique without intravenous contrast. COMPARISON:  No pertinent prior exam. FINDINGS: POSTERIOR CIRCULATION: --Vertebral arteries: Normal --Inferior cerebellar arteries: Normal. --Basilar artery: Normal. --Superior cerebellar arteries: Normal. --Posterior cerebral arteries: Normal. ANTERIOR CIRCULATION: --Intracranial internal carotid arteries: Normal. --Anterior cerebral arteries (ACA): Normal. --Middle cerebral arteries (MCA): Normal. ANATOMIC VARIANTS: None IMPRESSION: Normal intracranial MRA. Electronically Signed   By: Ulyses Jarred M.D.   On: 06/13/2021 22:17   MR ANGIO NECK W WO CONTRAST  Result Date: 06/13/2021 CLINICAL DATA:  Carotid artery stenosis EXAM: MRA NECK WITHOUT AND WITH CONTRAST TECHNIQUE: Multiplanar and multiecho pulse sequences of the neck were obtained without and with intravenous contrast. Angiographic images of the neck were obtained using MRA technique without and with intravenous contrast. CONTRAST:  45m GADAVIST GADOBUTROL 1 MMOL/ML IV SOLN COMPARISON:  None. FINDINGS: Normal proximal arch vessels, carotid systems and vertebral  arteries. IMPRESSION: Normal MRA of the neck. Electronically Signed   By: KUlyses JarredM.D.   On: 06/13/2021 22:22   MR BRAIN WO CONTRAST  Result Date: 06/13/2021 CLINICAL DATA:  Dizziness, nonspecific EXAM: MRI HEAD WITHOUT CONTRAST TECHNIQUE: Multiplanar, multiecho pulse sequences of the brain and surrounding structures were obtained without intravenous contrast. COMPARISON:  None. FINDINGS: Brain: There is no acute infarction or intracranial hemorrhage. There is no intracranial mass, mass effect, or edema. There is no hydrocephalus or extra-axial fluid collection. Patchy and confluent areas of T2 hyperintensity in the supratentorial and pontine white matter are nonspecific but probably reflect moderate chronic microvascular ischemic changes. Small chronic right parietal cortical infarct. Small chronic right cerebellar infarct. Prominence of the ventricles and sulci reflects parenchymal volume loss. Vascular: Major vessel flow voids at the skull base are preserved. Skull and upper cervical spine: Nonspecific T2 hyperintense lesion of the right frontal calvarium. No aggressive features on the prior CT. Marrow signal is otherwise within normal limits. Sinuses/Orbits: Paranasal sinuses are aerated. Orbits are unremarkable. Other: Sella is unremarkable.  Mastoid air cells are clear. IMPRESSION: No evidence of recent infarction, hemorrhage, or mass. Moderate chronic microvascular ischemic changes. 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 Right frontal calvarium lesion. No aggressive features on the prior head CT  but remains indeterminate given history of malignancy. Electronically Signed   By: Macy Mis M.D.   On: 06/13/2021 12:47   MR BRAIN W CONTRAST  Result Date: 06/13/2021 CLINICAL DATA:  Calvarial lesion EXAM: MRI HEAD WITH CONTRAST TECHNIQUE: Multiplanar, multiecho pulse sequences of the brain and surrounding structures were obtained with intravenous contrast. CONTRAST:  15m GADAVIST  GADOBUTROL 1 MMOL/ML IV SOLN COMPARISON:  Brain MRI 06/13/2021 Head CT 06/03/2021 FINDINGS: Postcontrast images were obtained supplementary to the earlier noncontrast brain MRI. The right frontal calvarium lesion shows moderate contrast enhancement. 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 Electronically Signed   By: KUlyses JarredM.D.   On: 06/13/2021 22:15

## 2021-06-14 NOTE — Consult Note (Signed)
NEUROLOGY CONSULTATION NOTE   Date of service: June 14, 2021 Patient Name: [name] MRN:  [id number] DOB:  1932-12-26 Reason for consult: lightheadedness, remote infarcts and calvarial lesion on MRI Requesting physician: Dr. Lala Lund _ _ _   _ __   _ __ _ _  __ __   _ __   __ _  History of Present Illness   This is an 87 year old woman with a history of colon cancer, hypertension, hyperlipidemia, prior discrete episodes of dizziness who presented to the emergency department yesterday complaining of lightheadedness upon standing up.  This is worse than the dizziness she has had previously.  It is positional.  She denies tinnitus or hearing loss.  She does not have vertiginous sensation and denies sensation that things are moving around her or the room is spinning.  She denies focal neurologic deficits.  Brain MRI without contrast was noted to have small chronic ischemic infarcts in the right parietal region and the cerebellum which she was unaware of and apparently has not received stroke work-up for previously therefore she was admitted for this.  She was noted to have a right frontal calvarial lesion that did not have any aggressive features on CT.  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  There was no abnormal enhancement in the brain parenchyma.  MRA head and neck showed no hemodynamically significant stenoses.  TTE is ordered and is pending.  Not on an antiplatelet prior to admission.  A1c was 5.2.  LDL was 57.8.  Urinalysis was not consistent with infection.   ROS   Per HPI; all other systems reviewed and are negative  Past History   Past Medical History:  Diagnosis Date   Allergic rhinitis, cause unspecified    Diarrhea    Edema    History of colon cancer    s/p surgery, chemotherapy   Internal hemorrhoids without mention of  complication    Malignant neoplasm of rectum (HCC)    Osteoarthrosis, unspecified whether generalized or localized, unspecified site    Osteoporosis, unspecified    Other acute reactions to stress    Other and unspecified hyperlipidemia    Other screening mammogram    Pain in limb    Special screening for osteoporosis    Ulceration of vulva, unspecified    Unspecified essential hypertension    Past Surgical History:  Procedure Laterality Date   dental implants     PARTIAL COLECTOMY  9/10   temporary colostomy   Family History  Problem Relation Age of Onset   Stroke Mother    Arthritis Mother    Arthritis Brother    Depression Sister    Social History   Socioeconomic History   Marital status: Married    Spouse name: Not on file   Number of children: 3   Years of education: Not on file   Highest education level: Not on file  Occupational History   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  Tobacco Use   Smoking status: Never   Smokeless tobacco: Never  Vaping Use   Vaping Use: Never used  Substance and Sexual Activity   Alcohol use: No    Alcohol/week: 0.0 standard drinks   Drug use: Yes    Types: Opium   Sexual activity: Yes  Other Topics Concern   Not on file  Social History Narrative   Not on file   Social Determinants  of Health   Financial Resource Strain: Low Risk    Difficulty of Paying Living Expenses: Not hard at all  Food Insecurity: No Food Insecurity   Worried About [place] in the Last Year: Never true   [place] in the Last Year: Never true  Transportation Needs: No Transportation Needs   Lack of Transportation (Medical): No   Lack of Transportation (Non-Medical): No  Physical Activity: Inactive   Days of Exercise per Week: 0 days   Minutes of Exercise per Session: 0 min  Stress: No Stress Concern Present   Feeling of Stress : Not at all  Social Connections: Not on file   No Known Allergies  Medications   Medications  Prior to Admission  Medication Sig Dispense Refill Last Dose   chlorthalidone (HYGROTON) 25 MG tablet Take 1 tablet (25 mg total) by mouth daily. 30 tablet 5 06/13/2021 at AM   Cholecalciferol (VITAMIN D PO) Take 5,000 Units by mouth daily.   Past Month   colestipol (COLESTID) 1 G tablet Take 2 g by mouth 3 (three) times daily.    06/12/2021   diltiazem (CARDIZEM CD) 240 MG 24 hr capsule Take 1 capsule (240 mg total) by mouth daily. 90 capsule 3 06/12/2021   Opium 10 MG/ML (1%) TINC Please take 0.2-0.4 mL three times per day   06/12/2021   cyclobenzaprine (FLEXERIL) 10 MG tablet Take 0.5-1 tablets (5-10 mg total) by mouth at bedtime as needed for muscle spasms. (Patient not taking: No sig reported) 15 tablet 0 Not Taking   diltiazem (DILACOR XR) 240 MG 24 hr capsule Take 1 capsule (240 mg total) by mouth daily. 30 capsule 0    glucosamine-chondroitin 500-400 MG tablet Take 2 tablets by mouth daily.  (Patient not taking: Reported on 06/13/2021)   Not Taking   paregoric 2 MG/5ML solution Take 5 mLs by mouth as needed for diarrhea or loose stools. (Patient not taking: Reported on 06/13/2021)   Not Taking     Vitals   Vitals:   06/14/21 0454 06/14/21 0750 06/14/21 1130 06/14/21 1700  BP: 123/71 (!) 142/77 134/69 134/85  Pulse: 64 74 70 73  Resp: '18 16 18 18  '$ Temp: (!) 97.5 F (36.4 C) 97.7 F (36.5 C) 97.9 F (36.6 C) 98.7 F (37.1 C)  TempSrc:      SpO2: 97% 98% 98% 94%  Weight:      Height:         Body mass index is 17.16 kg/m.  Physical Exam   Physical Exam Gen: A&O x4, NAD HEENT: Atraumatic, normocephalic;mucous membranes moist; oropharynx clear, tongue without atrophy or fasciculations. Neck: Supple, trachea midline. Resp: CTAB, no w/r/r CV: RRR, no m/g/r; nml S1 and S2. 2+ symmetric peripheral pulses. Abd: soft/NT/ND; nabs x 4 quad Extrem: Nml bulk; no cyanosis, clubbing, or edema.  Neuro: *MS: A&O x4. Follows multi-step commands.  *Speech: fluid, nondysarthric, able to  name and repeat *CN:    I: Deferred   II,III: PERRLA, VFF by confrontation, optic discs not visualized 2/2 pupillary constriction   III,IV,VI: EOMI w/o nystagmus, no ptosis   V: Sensation intact from V1 to V3 to LT   VII: Eyelid closure was full.  Smile symmetric.   VIII: Hearing intact to voice   IX,X: Voice normal, palate elevates symmetrically    XI: SCM/trap 5/5 bilat   XII: Tongue protrudes midline, no atrophy or fasciculations   *Motor:   Normal bulk.  No tremor, rigidity  or bradykinesia. No pronator drift.    Strength: Dlt Bic Tri WrE WrF FgS Gr HF KnF KnE PlF DoF    Left '5 5 5 5 5 5 5 5 5 5 5 5    '$ Right '5 5 5 5 5 5 5 5 5 5 5 5    '$ *Sensory: Intact to light touch, pinprick, temperature vibration throughout. Symmetric. Propioception intact bilat.  No double-simultaneous extinction.  *Coordination:  Finger-to-nose, heel-to-shin, rapid alternating motions were intact. *Reflexes:  2+ and symmetric throughout without clonus; toes down-going bilat *Gait: deferred  NIHSS = 0  Premorbid mRS = 1  Labs   CBC:  Recent Labs  Lab 06/13/21 0919  WBC 6.1  HGB 16.0*  HCT 47.7*  MCV 91.4  PLT 0000000    Basic Metabolic Panel:  Lab Results  Component Value Date   NA 137 06/13/2021   K 4.4 06/13/2021   CO2 26 06/13/2021   GLUCOSE 113 (H) 06/13/2021   BUN 17 06/13/2021   CREATININE 0.58 06/13/2021   CALCIUM 9.8 06/13/2021   GFRNONAA >60 06/13/2021   GFRAA >60 09/27/2013   Lipid Panel:  Lab Results  Component Value Date   LDLCALC 41 07/20/2019   HgbA1c:  Lab Results  Component Value Date   HGBA1C 5.2 06/13/2021   Urine Drug Screen: No results found for: LABOPIA, COCAINSCRNUR, LABBENZ, AMPHETMU, THCU, LABBARB  Alcohol Level No results found for: ETH   Impression   This is an 87 year old woman with a history of colon cancer, hypertension, hyperlipidemia, prior discrete episodes of dizziness who presented to the emergency department yesterday complaining of  lightheadedness upon standing up. Brain MRI without contrast was noted to have small chronic ischemic infarcts in the right parietal region and the cerebellum which she was unaware of and apparently has not received stroke work-up for previously therefore she was admitted for this. Her stroke workup is completed except for TTE  results still pending. She was noted to have a right frontal calvarial lesion that did not have any aggressive features on CT and showed contrast enhancement on MRI most c/w fibrous dysplasia. Given that she has a history of malignancy recommend outpatient f/u imaging in 3 months with head CT with and without contrast. Suspect lightheadedness 2/2 orthostatic hypotension, potentiated by sequelae of remote cerebellar infarct.  Recommendations   - F/u TTE - Continue ASA '81mg'$  daily - No statin indicated; LDL already at goal without - Stroke education - Amb referral to neurology upon discharge - Recommend repeat head CT with and without contrast in 3 mos with PCP or outpatient neurologist assess stability of R calvarial lesion  Will f/u TTE. If no intracardiac clot and no other significant findings, no further neurologic workup indicated as inpatient. ______________________________________________________________________   Thank you for the opportunity to take part in the care of this patient. If you have any further questions, please contact the neurology consultation attending.  Signed,  Su Monks, MD [facility] [phone]  If 7pm- 7am, please page neurology on call as listed in [place].

## 2021-06-14 NOTE — Evaluation (Signed)
Occupational Therapy Evaluation Patient Details Name: [name] MRN: [id number] DOB: 11/14/1932 Today's Date: 06/14/2021    History of Present Illness Presenting 2/2 sudden onset of dizziness that is worse with movement. MRI shows chronic cerebellar infarct. Treated medically with meclizine with no improvement of dizziness. PMHx is significant for migraines.   Clinical Impression   Pt seen for OT evaluation this date. Prior to admission, pt was independent in all ADLs, IADLs, and functional mobility, living in a 2-story home with spouse. Pt currently presents with dizziness, and subsequently decreased dynamic standing balance. This date, pt was able to independently complete bed mobility and standing ADLs MOD-I. Pt was also able to walk 1 lap around RN station (125f) without AD; pt observed to have x2 bouts of scissoring gait, with pt attributing to dizziness and able to regain balance/wide BOS without physical assist from this author. Pt appears to be at baseline functional independence with ADLs. Anticipate pt will be able to return to baseline independence with functional mobility following continued medical management of symptoms. Upon discharge, recommend SUPERVISION from family during functional mobility until dizziness subsides. No additional skilled OT needs identified at this time. OT will sign off. Please re-consult if additional needs arise.      Follow Up Recommendations  No OT follow up;Supervision - Intermittent    Equipment Recommendations  None recommended by OT       Precautions / Restrictions Precautions Precautions: Fall Precaution Comments: c/o dizziness Restrictions Weight Bearing Restrictions: No      Mobility Bed Mobility Overal bed mobility: Modified Independent             General bed mobility comments: Able to perform with HOB elevated    Transfers Overall transfer level: Modified independent Equipment used: None                   Balance Overall balance assessment: Needs assistance   Sitting balance-Leahy Scale: Normal Sitting balance - Comments: normal balance while sitting EOB   Standing balance support: No upper extremity supported;During functional activity Standing balance-Leahy Scale: Good Standing balance comment: Good static standing balance. Able to walk 1 lap around RN station; two bouts of scissoring gait, with pt attributing to dizziness and able to regain balance/wide BOS without physical assist.                           ADL either performed or assessed with clinical judgement   ADL Overall ADL's : Independent                                       General ADL Comments: MOD-I for standing UB ADLs and grooming tasks. Supervision for functional mobility of household distances (160 ft) without AD      Pertinent Vitals/Pain Pain Assessment: 0-10 Pain Score: 5  Pain Location: headache Pain Descriptors / Indicators: Aching Pain Intervention(s): Limited activity within patient's tolerance;Patient requesting pain meds-RN notified     Hand Dominance Right   Extremity/Trunk Assessment Upper Extremity Assessment Upper Extremity Assessment: Overall WFL for tasks assessed   Lower Extremity Assessment Lower Extremity Assessment: Overall WFL for tasks assessed   Cervical / Trunk Assessment Cervical / Trunk Assessment: Normal   Communication Communication Communication: No difficulties   Cognition Arousal/Alertness: Awake/alert Behavior During Therapy: WFL for tasks assessed/performed Overall Cognitive Status: Within Functional Limits for tasks assessed  Home Living Family/patient expects to be discharged to:: Private residence Living Arrangements: Spouse/significant other Available Help at Discharge: Family Type of Home: House       Home Layout: Two level Alternate Level Stairs-Number of Steps:  13   Bathroom Shower/Tub: Tub/shower unit;Walk-in shower         Home Equipment: None          Prior Functioning/Environment Level of Independence: Independent        Comments: Independent with ADLs, IADLs, and functional mobility w/out AD        OT Problem List: Impaired balance (sitting and/or standing)         OT Goals(Current goals can be found in the care plan section) Acute Rehab OT Goals Patient Stated Goal: to go home OT Goal Formulation: With patient/family Time For Goal Achievement: 06/28/21 Potential to Achieve Goals: Good   AM-PAC OT "6 Clicks" Daily Activity     Outcome Measure Help from another person eating meals?: None Help from another person taking care of personal grooming?: None Help from another person toileting, which includes using toliet, bedpan, or urinal?: A Little Help from another person bathing (including washing, rinsing, drying)?: A Little Help from another person to put on and taking off regular upper body clothing?: None Help from another person to put on and taking off regular lower body clothing?: None 6 Click Score: 22   End of Session Equipment Utilized During Treatment: Gait belt Nurse Communication: Mobility status;Patient requests pain meds  Activity Tolerance: Patient tolerated treatment well Patient left: in bed;with call bell/phone within reach  OT Visit Diagnosis: Unsteadiness on feet (R26.81)                Time: [id number] OT Time Calculation (min): 15 min Charges:  OT General Charges $OT Visit: 1 Visit OT Evaluation $OT Eval Moderate Complexity: [place] Elizabeth, OTR/L [place]

## 2021-06-14 NOTE — Evaluation (Signed)
Physical Therapy Evaluation Patient Details Name: [name] MRN: [id number] DOB: 1933/03/13 Today's Date: 06/14/2021   History of Present Illness  Presenting 2/2 sudden onset of dizziness that is worse with movement. MRI shows chronic cerebellar infarct. Treated medically with meclizine with no improvement of dizziness. PMHx is significant for migraines.  Clinical Impression  The pt demonstrates ability to mobilize in room at baseline level of function. She demonstrates no significant balance or gait impairment, however she does c/o dizziness following short distance gait. The pt has negative HINTS test, but it is important to note that she stated minimal symptoms at the time of testing. At this time she demonstrates baseline level of mobility. PT will delist. Please re-consult if needs change.    Follow Up Recommendations No PT follow up    Equipment Recommendations       Recommendations for Other Services       Precautions / Restrictions Precautions Precautions: Fall Precaution Comments: c/o dizziness      Mobility  Bed Mobility Overal bed mobility: Modified Independent             General bed mobility comments: HOB elevated; walked in on patient transferring to EOB.    Transfers Overall transfer level: Modified independent Equipment used: None                Ambulation/Gait Ambulation/Gait assistance: Supervision Gait Distance (Feet): 20 Feet Assistive device: None          Stairs            Wheelchair Mobility    Modified Rankin (Stroke Patients Only)       Balance Overall balance assessment: Mild deficits observed, not formally tested                                           Pertinent Vitals/Pain Pain Assessment: No/denies pain    Home Living Family/patient expects to be discharged to:: Private residence Living Arrangements: Spouse/significant other Available Help at Discharge: Family Type of Home: House        Home Layout: Two level Home Equipment: None      Prior Function Level of Independence: Independent               Hand Dominance   Dominant Hand: Right    Extremity/Trunk Assessment   Upper Extremity Assessment Upper Extremity Assessment: Overall WFL for tasks assessed    Lower Extremity Assessment Lower Extremity Assessment: Overall WFL for tasks assessed    Cervical / Trunk Assessment Cervical / Trunk Assessment: Normal  Communication   Communication: No difficulties  Cognition Arousal/Alertness: Awake/alert Behavior During Therapy: WFL for tasks assessed/performed Overall Cognitive Status: Within Functional Limits for tasks assessed                                        General Comments      Exercises Other Exercises Other Exercises: H.I.N.T.S test completed; no abnormal findings. Important to note that the head impulse testing was difficult to complete d/t guarding of the next musculature for lateral rotation.   Assessment/Plan    PT Assessment Patent does not need any further PT services  PT Problem List         PT Treatment Interventions      PT Goals (Current  goals can be found in the Care Plan section)  Acute Rehab PT Goals Patient Stated Goal: not come back to hospital PT Goal Formulation: With patient Time For Goal Achievement: 06/28/21 Potential to Achieve Goals: Good    Frequency     Barriers to discharge        Co-evaluation               AM-PAC PT "6 Clicks" Mobility  Outcome Measure Help needed turning from your back to your side while in a flat bed without using bedrails?: None Help needed moving from lying on your back to sitting on the side of a flat bed without using bedrails?: None Help needed moving to and from a bed to a chair (including a wheelchair)?: None Help needed standing up from a chair using your arms (e.g., wheelchair or bedside chair)?: None Help needed to walk in hospital room?:  None Help needed climbing 3-5 steps with a railing? : None 6 Click Score: 24    End of Session   Activity Tolerance: Patient tolerated treatment well Patient left: in bed Nurse Communication: Mobility status PT Visit Diagnosis: Dizziness and giddiness (R42)    Time: [id number] PT Time Calculation (min) (ACUTE ONLY): 19 min   Charges:   PT Evaluation $PT Eval Low Complexity: 1 Low PT Treatments $Gait Training: 8-22 mins        2:29 PM, 06/14/21 Rebekah Sprinkle A. Saverio Danker PT, DPT Physical Therapist - [facility]   Jaymarion Trombly A Yana Schorr 06/14/2021, 2:24 PM

## 2021-06-15 ENCOUNTER — Observation Stay
Admit: 2021-06-15 | Discharge: 2021-06-15 | Disposition: A | Discharge status: Home / Self Care | Payer: Medicare Other | Attending: Neurology | Admitting: Neurology

## 2021-06-15 DIAGNOSIS — I639 Cerebral infarction, unspecified: Secondary | ICD-10-CM | POA: Diagnosis not present

## 2021-06-15 DIAGNOSIS — R42 Dizziness and giddiness: Secondary | ICD-10-CM | POA: Diagnosis not present

## 2021-06-15 DIAGNOSIS — I6389 Other cerebral infarction: Secondary | ICD-10-CM | POA: Diagnosis not present

## 2021-06-15 LAB — ECHOCARDIOGRAM COMPLETE BUBBLE STUDY
AR max vel: 2.05 cm2
AV Area VTI: 2.34 cm2
AV Area mean vel: 1.9 cm2
AV Mean grad: 3 mmHg
AV Peak grad: 4.4 mmHg
Ao pk vel: 1.05 m/s
Area-P 1/2: 4.8 cm2
S' Lateral: 2.08 cm

## 2021-06-15 MED ORDER — CLONIDINE HCL 0.1 MG PO TABS: 0.1000 mg | ORAL_TABLET | Freq: Every day | ORAL | 0 refills | Status: DC | PRN

## 2021-06-15 NOTE — Progress Notes (Signed)
*  PRELIMINARY RESULTS* Echocardiogram 2D Echocardiogram has been performed.  Sherrie Sport 06/15/2021, 8:53 AM

## 2021-06-15 NOTE — TOC Initial Note (Signed)
Transition of Care [facility]) - Initial/Assessment Note    Patient Details  Name: [name] MRN: [id number] Date of Birth: 12-May-1933  Transition of Care [facility]) CM/SW Contact:    Shelbie Hutching, RN Phone Number: 06/15/2021, 2:12 PM  Clinical Narrative:                 MOON reviewed with patient at the bedside.  Patient is from home with her husband and is independent.   No TOC needs identified.  Expected Discharge Plan: Home/Self Care Barriers to Discharge: Continued Medical Work up   Patient Goals and CMS Choice Patient states their goals for this hospitalization and ongoing recovery are:: to get back home      Expected Discharge Plan and Services Expected Discharge Plan: Home/Self Care       Living arrangements for the past 2 months: Single Family Home                 DME Arranged: N/A DME Agency: NA       HH Arranged: NA          Prior Living Arrangements/Services Living arrangements for the past 2 months: Single Family Home Lives with:: Spouse Patient language and need for interpreter reviewed:: Yes              Criminal Activity/Legal Involvement Pertinent to Current Situation/Hospitalization: No - Comment as needed  Activities of Daily Living Home Assistive Devices/Equipment: None ADL Screening (condition at time of admission) Patient's cognitive ability adequate to safely complete daily activities?: Yes Is the patient deaf or have difficulty hearing?: No Does the patient have difficulty seeing, even when wearing glasses/contacts?: No Does the patient have difficulty concentrating, remembering, or making decisions?: No Patient able to express need for assistance with ADLs?: Yes Does the patient have difficulty dressing or bathing?: No Independently performs ADLs?: Yes (appropriate for developmental age) Does the patient have difficulty walking or climbing stairs?: No Weakness of Legs: None Weakness of Arms/Hands: None  Permission Sought/Granted    Permission granted to share information with : No              Emotional Assessment Appearance:: Appears stated age Attitude/Demeanor/Rapport: Engaged Affect (typically observed): Accepting Orientation: : Oriented to Self, Oriented to Place, Oriented to  Time, Oriented to Situation Alcohol / Substance Use: Not Applicable Psych Involvement: No (comment)  Admission diagnosis:  Dizziness [R42] Patient Active Problem List   Diagnosis Date Noted   History of stroke involving cerebellum    Abnormal head CT    Dizziness 06/13/2021   Essential hypertension 06/13/2021   Right-sided low back pain without sciatica 01/25/2019   Anterior neck pain 07/23/2016   Peripheral edema 07/23/2016   Hypokalemia 01/27/2016   Abnormal weight loss 01/27/2016   Counseling regarding end of life decision making 01/24/2015   Vitamin D deficiency 12/12/2012   Chronic diarrhea 10/27/2009   RECTAL CANCER 07/23/2009   ALLERGIC RHINITIS 01/16/2009   HEMORRHOIDS, INTERNAL 11/20/2008   ANXIETY, SITUATIONAL 05/31/2007   Hyperlipidemia 05/30/2007   Essential hypertension, benign 05/30/2007   Osteoarthritis of knees, bilateral 05/30/2007   At high risk for osteoporosis, no DEXA 05/30/2007   PCP:  Jinny Sanders, MD Pharmacy:   [place], [place] - [address] [place] [address] Phone: [phone] Fax: [place], [place] [address] [place] [address] Phone: [phone] Fax: [phone]  Social Determinants of Health (SDOH) Interventions    Readmission Risk Interventions No flowsheet data found.   

## 2021-06-15 NOTE — Progress Notes (Signed)
SLP Cancellation Note  Patient Details Name: [name] MRN: [id number] DOB: 12-13-1932   Cancelled treatment:       Reason Eval/Treat Not Completed: SLP screened, no needs identified, will sign off (chart reviewed; consulted pt). Pt denied any difficulty swallowing and is currently on a regular diet; tolerates swallowing pills w/ water per NSG. Pt conversed in conversation w/out overt expressive/receptive deficits noted; pt denied any speech-language deficits. Speech clear. She denied any Reading deficits as well(noted book in lap). No further skilled ST services indicated as pt appears at her baseline. Pt agreed. NSG to reconsult if any change in status while admitted.       Orinda Kenner, MS, CCC-SLP Speech Language Pathologist Rehab Services [phone] [facility] 06/15/2021, 11:10 AM

## 2021-06-15 NOTE — Progress Notes (Signed)
PROGRESS NOTE                                                                                                                                                                                                             Patient Demographics:    [name], is a 87 y.o. female, DOB - 16-Dec-1932, [id number]  Outpatient Primary MD for the patient is Jinny Sanders, MD    LOS - 0  Admit date - 06/13/2021    Chief Complaint  Patient presents with   Dizziness   Hypertension       Brief Narrative (HPI from H&P) - [name] is a 87 y.o. female with medical history significant for hypertension who presents to the emergency room for evaluation of sudden onset dizziness that started on the morning of her admission.  Patient states that she was in her usual state of health until this morning when she noticed that she was dizzy mostly upon standing up or standing up, work-up here showing nonacute MRI but with fibrous dysplasia like changes.   Subjective:   Patient in bed, appears comfortable, denies any headache, no fever, no chest pain or pressure, no shortness of breath , no abdominal pain. No new focal weakness.  Dizziness is improving.   Assessment  & Plan :      Acute onset of positional dizziness upon sitting up and standing up-do not think this is vertigo, there is no nystagmus or ringing in ears, she has no vertigo-like symptoms, this could be orthostatic hypotension question if fibrous dysplasia in her  right frontal calvarium could be causing the symptoms.  Neurology is following, seen by PT OT and cleared, echo pending, gradually advance activity and monitor.  2.  Hypertension.  Continue diltiazem hold diuretic.  Patient supposedly has rapid fluctuations in blood pressure in the outpatient setting as informed by patient's husband, will give as needed Catapres upon discharge  3.  Right frontal fibrous dysplasia.   Repeat CT scan in 2 to 3 months, outpatient neurology follow-up within 2 months.  PCP to arrange.      Condition - Fair  Family Communication  : Daughter over the phone on 06/14/2021, husband bedside 06/14/2021  Code Status :  Full  Consults  :  Neuro  PUD Prophylaxis :    Procedures  :  MRI - A - Contrast enhancing lesion of the right frontal calvarium, possibly fibrous dysplasia.      Disposition Plan  :    Status is: Observation  Dispo: The patient is from: Home              Anticipated d/c is to: Home              Patient currently is not medically stable to d/c.   Difficult to place patient No  DVT Prophylaxis  :    heparin injection 5,000 Units Start: 06/14/21 1400 SCD's Start: 06/13/21 1517    Lab Results  Component Value Date   PLT 293 06/13/2021    Diet :  Diet Order             Diet 2 gram sodium Room service appropriate? Yes; Fluid consistency: Thin  Diet effective now                    Inpatient Medications  Scheduled Meds:   stroke: mapping our early stages of recovery book   Does not apply Once   aspirin EC  81 mg Oral Daily   cholecalciferol  5,000 Units Oral Daily   colestipol  2 g Oral TID with meals   diltiazem  240 mg Oral Daily   heparin injection (subcutaneous)  5,000 Units Subcutaneous Q8H   meclizine  25 mg Oral TID   Opium  0.2 mL Oral TID   Continuous Infusions: PRN Meds:.acetaminophen **OR** acetaminophen (TYLENOL) oral liquid 160 mg/5 mL **OR** acetaminophen, labetalol, paregoric  Antibiotics  :    Anti-infectives (From admission, onward)    None        Time Spent in minutes  30   Lala Lund M.D on 06/15/2021 at 12:40 PM  To page go to [email or web address]   [facility] -  Office  [phone]    See all Orders from today for further details    Objective:   Vitals:   06/14/21 2106 06/15/21 0044 06/15/21 0802 06/15/21 1129  BP: 123/78 109/65 137/87 121/77  Pulse: 70 66 78 75  Resp: '18 16 18  16  '$ Temp: 97.6 F (36.4 C) (!) 97.4 F (36.3 C) 97.8 F (36.6 C) 97.9 F (36.6 C)  TempSrc:   Oral Oral  SpO2: 97% 95% 97% 96%  Weight:      Height:        Wt Readings from Last 3 Encounters:  06/13/21 45.4 kg  06/03/21 47.6 kg  10/26/20 49 kg     Intake/Output Summary (Last 24 hours) at 06/15/2021 1240 Last data filed at 06/15/2021 1024 Gross per 24 hour  Intake 240 ml  Output --  Net 240 ml     Physical Exam  Awake Alert, No new F.N deficits, Normal affect [place].AT,PERRAL Supple Neck,No JVD, No cervical lymphadenopathy appriciated.  Symmetrical Chest wall movement, Good air movement bilaterally, CTAB RRR,No Gallops, Rubs or new Murmurs, No Parasternal Heave +ve B.Sounds, Abd Soft, No tenderness, No organomegaly appriciated, No rebound - guarding or rigidity. No Cyanosis, Clubbing or edema, No new Rash or bruise    Data Review:    CBC Recent Labs  Lab 06/13/21 0919  WBC 6.1  HGB 16.0*  HCT 47.7*  PLT 293  MCV 91.4  MCH 30.7  MCHC 33.5  RDW 14.7    Recent Labs  Lab 06/13/21 0919 06/13/21 1429  NA 137  --   K 4.4  --   CL 101  --  CO2 26  --   GLUCOSE 113*  --   BUN 17  --   CREATININE 0.58  --   CALCIUM 9.8  --   HGBA1C  --  5.2    ------------------------------------------------------------------------------------------------------------------ Recent Labs    06/13/21 1429  LDLDIRECT 57.8    Lab Results  Component Value Date   HGBA1C 5.2 06/13/2021   ------------------------------------------------------------------------------------------------------------------ No results for input(s): TSH, T4TOTAL, T3FREE, THYROIDAB in the last 72 hours.  Invalid input(s): FREET3  Cardiac Enzymes No results for input(s): CKMB, TROPONINI, MYOGLOBIN in the last 168 hours.  Invalid input(s): CK ------------------------------------------------------------------------------------------------------------------ No results found for:  BNP    Radiology Reports CT HEAD WO CONTRAST (5MM)  Result Date: 06/03/2021 CLINICAL DATA:  Headache, chronic, new features or increased frequency EXAM: CT HEAD WITHOUT CONTRAST TECHNIQUE: Contiguous axial images were obtained from the base of the skull through the vertex without intravenous contrast. COMPARISON:  None. FINDINGS: Brain: There is no acute intracranial hemorrhage, mass effect, or edema. Gray-white differentiation is preserved. There is no extra-axial fluid collection. Patchy hypoattenuation in the supratentorial white matter is nonspecific but probably reflects moderate chronic microvascular ischemic changes. Prominence of the ventricles and sulci reflects generalized parenchymal volume loss. Vascular: There is atherosclerotic calcification at the skull base. Skull: Calvarium is unremarkable. Sinuses/Orbits: No acute finding. Other: None. IMPRESSION: No acute intracranial abnormality. Chronic microvascular ischemic changes. Electronically Signed   By: Macy Mis M.D.   On: 06/03/2021 12:48   MR ANGIO HEAD WO CONTRAST  Result Date: 06/13/2021 CLINICAL DATA:  Neuro deficit, acute, stroke suspected EXAM: MRA HEAD WITHOUT CONTRAST TECHNIQUE: Angiographic images of the Circle of Willis were acquired using MRA technique without intravenous contrast. COMPARISON:  No pertinent prior exam. FINDINGS: POSTERIOR CIRCULATION: --Vertebral arteries: Normal --Inferior cerebellar arteries: Normal. --Basilar artery: Normal. --Superior cerebellar arteries: Normal. --Posterior cerebral arteries: Normal. ANTERIOR CIRCULATION: --Intracranial internal carotid arteries: Normal. --Anterior cerebral arteries (ACA): Normal. --Middle cerebral arteries (MCA): Normal. ANATOMIC VARIANTS: None IMPRESSION: Normal intracranial MRA. Electronically Signed   By: Ulyses Jarred M.D.   On: 06/13/2021 22:17   MR ANGIO NECK W WO CONTRAST  Result Date: 06/13/2021 CLINICAL DATA:  Carotid artery stenosis EXAM: MRA NECK WITHOUT  AND WITH CONTRAST TECHNIQUE: Multiplanar and multiecho pulse sequences of the neck were obtained without and with intravenous contrast. Angiographic images of the neck were obtained using MRA technique without and with intravenous contrast. CONTRAST:  82m GADAVIST GADOBUTROL 1 MMOL/ML IV SOLN COMPARISON:  None. FINDINGS: Normal proximal arch vessels, carotid systems and vertebral arteries. IMPRESSION: Normal MRA of the neck. Electronically Signed   By: KUlyses JarredM.D.   On: 06/13/2021 22:22   MR BRAIN WO CONTRAST  Result Date: 06/13/2021 CLINICAL DATA:  Dizziness, nonspecific EXAM: MRI HEAD WITHOUT CONTRAST TECHNIQUE: Multiplanar, multiecho pulse sequences of the brain and surrounding structures were obtained without intravenous contrast. COMPARISON:  None. FINDINGS: Brain: There is no acute infarction or intracranial hemorrhage. There is no intracranial mass, mass effect, or edema. There is no hydrocephalus or extra-axial fluid collection. Patchy and confluent areas of T2 hyperintensity in the supratentorial and pontine white matter are nonspecific but probably reflect moderate chronic microvascular ischemic changes. Small chronic right parietal cortical infarct. Small chronic right cerebellar infarct. Prominence of the ventricles and sulci reflects parenchymal volume loss. Vascular: Major vessel flow voids at the skull base are preserved. Skull and upper cervical spine: Nonspecific T2 hyperintense lesion of the right frontal calvarium. No aggressive features on the prior CT. Marrow signal  is otherwise within normal limits. Sinuses/Orbits: Paranasal sinuses are aerated. Orbits are unremarkable. Other: Sella is unremarkable.  Mastoid air cells are clear. IMPRESSION: No evidence of recent infarction, hemorrhage, or mass. Moderate chronic microvascular ischemic changes. 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 Right frontal calvarium lesion. No aggressive features on the prior head CT but remains  indeterminate given history of malignancy. Electronically Signed   By: Macy Mis M.D.   On: 06/13/2021 12:47   MR BRAIN W CONTRAST  Result Date: 06/13/2021 CLINICAL DATA:  Calvarial lesion EXAM: MRI HEAD WITH CONTRAST TECHNIQUE: Multiplanar, multiecho pulse sequences of the brain and surrounding structures were obtained with intravenous contrast. CONTRAST:  48m GADAVIST GADOBUTROL 1 MMOL/ML IV SOLN COMPARISON:  Brain MRI 06/13/2021 Head CT 06/03/2021 FINDINGS: Postcontrast images were obtained supplementary to the earlier noncontrast brain MRI. The right frontal calvarium lesion shows moderate contrast enhancement. 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 Electronically Signed   By: KUlyses JarredM.D.   On: 06/13/2021 22:15

## 2021-06-15 NOTE — Discharge Instructions (Addendum)
Follow with Primary MD Jinny Sanders, MD in 7 days review your MRI findings with your PCP.  You need follow-up with neurologist within a month for a repeat head CT scan.  Get CBC, CMP, 2 view Chest X ray -  checked next visit within 1 week by Primary MD   Activity: As tolerated with Full fall precautions use walker/cane & assistance as needed  Disposition Home   Diet: Heart Healthy   Special Instructions: If you have smoked or chewed Tobacco  in the last 2 yrs please stop smoking, stop any regular Alcohol  and or any Recreational drug use.  On your next visit with your primary care physician please Get Medicines reviewed and adjusted.  Please request your Prim.MD to go over all Hospital Tests and Procedure/Radiological results at the follow up, please get all Hospital records sent to your Prim MD by signing hospital release before you go home.  If you experience worsening of your admission symptoms, develop shortness of breath, life threatening emergency, suicidal or homicidal thoughts you must seek medical attention immediately by calling 911 or calling your MD immediately  if symptoms less severe.  You Must read complete instructions/literature along with all the possible adverse reactions/side effects for all the Medicines you take and that have been prescribed to you. Take any new Medicines after you have completely understood and accpet all the possible adverse reactions/side effects.

## 2021-06-15 NOTE — Care Management Obs Status (Signed)
[place] NOTIFICATION   Patient Details  Name: [name] MRN: [id number] Date of Birth: Jan 04, 1933   Medicare Observation Status Notification Given:  Yes    Shelbie Hutching, RN 06/15/2021, 12:31 PM

## 2021-06-16 DIAGNOSIS — R42 Dizziness and giddiness: Secondary | ICD-10-CM | POA: Diagnosis not present

## 2021-06-16 MED ORDER — MECLIZINE HCL 25 MG PO TABS: 25.0000 mg | ORAL_TABLET | Freq: Three times a day (TID) | ORAL | 0 refills | Status: DC | PRN

## 2021-06-16 MED ORDER — ASPIRIN 81 MG PO TBEC: 81.0000 mg | DELAYED_RELEASE_TABLET | Freq: Every day | ORAL | 0 refills | Status: AC

## 2021-06-16 NOTE — Discharge Summary (Signed)
[name] [id number] DOB: May 20, 1933 DOA: 06/13/2021  PCP: Jinny Sanders, MD  Admit date: 06/13/2021  Discharge date: 06/16/2021  Admitted From: Home Disposition:  Home   Recommendations for Outpatient Follow-up:   Follow up with PCP in 1-2 weeks  PCP Please obtain BMP/CBC, 2 view CXR in 1week,  (see Discharge instructions)   PCP Please follow up on the following pending results: Review MRI brain report, needs outpatient neurology follow-up within a month.  Monitor blood pressure and orthostatics closely.   Home Health: None   Equipment/Devices: Walker  Consultations: Neuro Discharge Condition: Stable    CODE STATUS: Full    Diet Recommendation: Heart Healthy   Diet Order             Diet - low sodium heart healthy           Diet 2 gram sodium Room service appropriate? Yes; Fluid consistency: Thin  Diet effective now                    Chief Complaint  Patient presents with   Dizziness   Hypertension     Brief history of present illness from the day of admission and additional interim summary    [name] is a 87 y.o. female with medical history significant for hypertension who presents to the emergency room for evaluation of sudden onset dizziness that started on the morning of her admission.  Patient states that she was in her usual state of health until this morning when she noticed that she was dizzy mostly upon standing up or standing up, work-up here showing nonacute MRI but with fibrous dysplasia like changes.                                                                 Hospital Course    Acute onset of positional dizziness upon sitting up and standing up-do not think this is vertigo, there is no nystagmus or ringing in ears, she has no vertigo-like symptoms, this could  have been transient orthostatic hypotension, question if fibrous dysplasia in her  right frontal calvarium could be causing the symptoms.  She did well with PT OT dizziness is much improved after supportive care, stable echocardiogram, cleared by neurology PT and OT to go home, will be discharged home with outpatient PT follow-up along with outpatient neurology follow-up within a month, have given her as needed Antivert in case she develops any vertigo, TED stockings for daytime to prevent any transient orthostasis.  She did have small chronic cerebellar infarcts for which she is being placed on 81 mg of aspirin, this could be causing transient vertigo but we could not prove it here.  PCP to monitor secondary risk factors for CVA.   2.  Hypertension.  Continue  diltiazem hold diuretic.  Patient supposedly has rapid fluctuations in blood pressure in the outpatient setting as informed by patient's husband, will give as needed Catapres upon discharge.   3.  Right frontal fibrous dysplasia.  Repeat CT scan in 2 to 3 months, outpatient neurology follow-up within 2 months.  PCP to arrange.  Discharge diagnosis     Principal Problem:   Dizziness Active Problems:   Essential hypertension   History of stroke involving cerebellum   Abnormal head CT    Discharge instructions    Discharge Instructions     Diet - low sodium heart healthy   Complete by: As directed    Discharge instructions   Complete by: As directed    Follow with Primary MD Jinny Sanders, MD in 7 days review your MRI findings with your PCP.  You need follow-up with neurologist within a month for a repeat head CT scan.  Get CBC, CMP, 2 view Chest X ray -  checked next visit within 1 week by Primary MD   Activity: As tolerated with Full fall precautions use walker/cane & assistance as needed  Disposition Home   Diet: Heart Healthy   Special Instructions: If you have smoked or chewed Tobacco  in the last 2 yrs please stop  smoking, stop any regular Alcohol  and or any Recreational drug use.  On your next visit with your primary care physician please Get Medicines reviewed and adjusted.  Please request your Prim.MD to go over all Hospital Tests and Procedure/Radiological results at the follow up, please get all Hospital records sent to your Prim MD by signing hospital release before you go home.  If you experience worsening of your admission symptoms, develop shortness of breath, life threatening emergency, suicidal or homicidal thoughts you must seek medical attention immediately by calling 911 or calling your MD immediately  if symptoms less severe.  You Must read complete instructions/literature along with all the possible adverse reactions/side effects for all the Medicines you take and that have been prescribed to you. Take any new Medicines after you have completely understood and accpet all the possible adverse reactions/side effects.   Increase activity slowly   Complete by: As directed        Discharge Medications   Allergies as of 06/16/2021   No Known Allergies      Medication List     STOP taking these medications    cyclobenzaprine 10 MG tablet Commonly known as: FLEXERIL   glucosamine-chondroitin 500-400 MG tablet       TAKE these medications    aspirin 81 MG EC tablet Take 1 tablet (81 mg total) by mouth daily. Swallow whole. Start taking on: June 17, 2021   chlorthalidone 25 MG tablet Commonly known as: HYGROTON Take 1 tablet (25 mg total) by mouth daily.   cloNIDine 0.1 MG tablet Commonly known as: Catapres Take 1 tablet (0.1 mg total) by mouth daily as needed (SBP > 150).   colestipol 1 g tablet Commonly known as: COLESTID Take 2 g by mouth 3 (three) times daily.   diltiazem 240 MG 24 hr capsule Commonly known as: CARDIZEM CD Take 1 capsule (240 mg total) by mouth daily.   diltiazem 240 MG 24 hr capsule Commonly known as: DILACOR XR Take 1 capsule (240 mg  total) by mouth daily.   meclizine 25 MG tablet Commonly known as: ANTIVERT Take 1 tablet (25 mg total) by mouth 3 (three) times daily as needed for dizziness.  Opium 10 MG/ML (1%) Tinc Please take 0.2-0.4 mL three times per day   paregoric 2 MG/5ML solution Take 5 mLs by mouth as needed for diarrhea or loose stools.   VITAMIN D PO Take 5,000 Units by mouth daily.               Durable Medical Equipment  (From admission, onward)           Start     Ordered   06/16/21 0821  For home use only DME Walker rolling  Once       Comments: 5 wheel  Question Answer Comment  Walker: With 5 Inch Wheels   Patient needs a walker to treat with the following condition Weakness      06/16/21 0821             Follow-up Information     Jinny Sanders, MD. Schedule an appointment as soon as possible for a visit in 1 week(s).   Specialty: Family Medicine Contact information: [place] [address] [facility] Follow up in 4 week(s).   Why: Fibrous dysplasia on MRI needs repeat CT scan and neurology follow-up Contact information: [address]     [place] [phone]                Major procedures and Radiology Reports - PLEASE review detailed and final reports thoroughly  -      CT HEAD WO CONTRAST (5MM)  Result Date: 06/03/2021 CLINICAL DATA:  Headache, chronic, new features or increased frequency EXAM: CT HEAD WITHOUT CONTRAST TECHNIQUE: Contiguous axial images were obtained from the base of the skull through the vertex without intravenous contrast. COMPARISON:  None. FINDINGS: Brain: There is no acute intracranial hemorrhage, mass effect, or edema. Gray-white differentiation is preserved. There is no extra-axial fluid collection. Patchy hypoattenuation in the supratentorial white matter is nonspecific but probably reflects moderate chronic microvascular  ischemic changes. Prominence of the ventricles and sulci reflects generalized parenchymal volume loss. Vascular: There is atherosclerotic calcification at the skull base. Skull: Calvarium is unremarkable. Sinuses/Orbits: No acute finding. Other: None. IMPRESSION: No acute intracranial abnormality. Chronic microvascular ischemic changes. Electronically Signed   By: Macy Mis M.D.   On: 06/03/2021 12:48   MR ANGIO HEAD WO CONTRAST  Result Date: 06/13/2021 CLINICAL DATA:  Neuro deficit, acute, stroke suspected EXAM: MRA HEAD WITHOUT CONTRAST TECHNIQUE: Angiographic images of the Circle of Willis were acquired using MRA technique without intravenous contrast. COMPARISON:  No pertinent prior exam. FINDINGS: POSTERIOR CIRCULATION: --Vertebral arteries: Normal --Inferior cerebellar arteries: Normal. --Basilar artery: Normal. --Superior cerebellar arteries: Normal. --Posterior cerebral arteries: Normal. ANTERIOR CIRCULATION: --Intracranial internal carotid arteries: Normal. --Anterior cerebral arteries (ACA): Normal. --Middle cerebral arteries (MCA): Normal. ANATOMIC VARIANTS: None IMPRESSION: Normal intracranial MRA. Electronically Signed   By: Ulyses Jarred M.D.   On: 06/13/2021 22:17   MR ANGIO NECK W WO CONTRAST  Result Date: 06/13/2021 CLINICAL DATA:  Carotid artery stenosis EXAM: MRA NECK WITHOUT AND WITH CONTRAST TECHNIQUE: Multiplanar and multiecho pulse sequences of the neck were obtained without and with intravenous contrast. Angiographic images of the neck were obtained using MRA technique without and with intravenous contrast. CONTRAST:  31m GADAVIST GADOBUTROL 1 MMOL/ML IV SOLN COMPARISON:  None. FINDINGS: Normal proximal arch vessels, carotid systems and vertebral arteries. IMPRESSION: Normal MRA of the neck. Electronically Signed   By: KCletus GashD.  On: 06/13/2021 22:22   MR BRAIN WO CONTRAST  Result Date: 06/13/2021 CLINICAL DATA:  Dizziness, nonspecific EXAM: MRI HEAD WITHOUT  CONTRAST TECHNIQUE: Multiplanar, multiecho pulse sequences of the brain and surrounding structures were obtained without intravenous contrast. COMPARISON:  None. FINDINGS: Brain: There is no acute infarction or intracranial hemorrhage. There is no intracranial mass, mass effect, or edema. There is no hydrocephalus or extra-axial fluid collection. Patchy and confluent areas of T2 hyperintensity in the supratentorial and pontine white matter are nonspecific but probably reflect moderate chronic microvascular ischemic changes. Small chronic right parietal cortical infarct. Small chronic right cerebellar infarct. Prominence of the ventricles and sulci reflects parenchymal volume loss. Vascular: Major vessel flow voids at the skull base are preserved. Skull and upper cervical spine: Nonspecific T2 hyperintense lesion of the right frontal calvarium. No aggressive features on the prior CT. Marrow signal is otherwise within normal limits. Sinuses/Orbits: Paranasal sinuses are aerated. Orbits are unremarkable. Other: Sella is unremarkable.  Mastoid air cells are clear. IMPRESSION: No evidence of recent infarction, hemorrhage, or mass. Moderate chronic microvascular ischemic changes. 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 Right frontal calvarium lesion. No aggressive features on the prior head CT but remains indeterminate given history of malignancy. Electronically Signed   By: Macy Mis M.D.   On: 06/13/2021 12:47   MR BRAIN W CONTRAST  Result Date: 06/13/2021 CLINICAL DATA:  Calvarial lesion EXAM: MRI HEAD WITH CONTRAST TECHNIQUE: Multiplanar, multiecho pulse sequences of the brain and surrounding structures were obtained with intravenous contrast. CONTRAST:  27m GADAVIST GADOBUTROL 1 MMOL/ML IV SOLN COMPARISON:  Brain MRI 06/13/2021 Head CT 06/03/2021 FINDINGS: Postcontrast images were obtained supplementary to the earlier noncontrast brain MRI. The right frontal calvarium lesion shows moderate  contrast enhancement. 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 Electronically Signed   By: KUlyses JarredM.D.   On: 06/13/2021 22:15   ECHOCARDIOGRAM COMPLETE BUBBLE STUDY  Result Date: 06/15/2021    ECHOCARDIOGRAM REPORT   Patient Name:   DTASMINE GIESBRECHTDate of Exam: 06/15/2021 Medical Rec #:  [id number]     Height:       64.0 in Accession #:    [id number]    Weight:       100.0 lb Date of Birth:  [birth year]     BSA:          1.457 m Patient Age:    89years       BP:           137/87 mmHg Patient Gender: F              HR:           78 bpm. Exam Location:  [facility] Procedure: Cardiac Doppler, Color Doppler and Saline Contrast Bubble Study Indications:     Stroke 434.91 /I63.9  History:         Patient has prior history of Echocardiogram examinations, most                  recent 08/02/2016. Edema.  Sonographer:     JSherrie SportReferring Phys:  ANM:[id number] PourSTACK Diagnosing Phys: DYolonda KidaMD  Sonographer Comments: Suboptimal apical window. IMPRESSIONS  1. Left ventricular ejection fraction, by estimation, is 70 to 75%. The left ventricle has hyperdynamic function. The left ventricle has no regional wall motion abnormalities. There is mild left ventricular hypertrophy. Left ventricular diastolic parameters were  normal.  2. Right ventricular systolic function is normal. The right ventricular size is normal.  3. The mitral valve is normal in structure. Mild mitral valve regurgitation.  4. The aortic valve is normal in structure. Aortic valve regurgitation is not visualized. Mild aortic valve sclerosis is present, with no evidence of aortic valve stenosis. FINDINGS  Left Ventricle: Left ventricular ejection fraction, by estimation, is 70 to 75%. The left ventricle has hyperdynamic function. The left ventricle has no regional wall motion abnormalities. The left ventricular internal cavity size was normal in  size. There is mild left ventricular hypertrophy. Left ventricular diastolic parameters were normal. Right Ventricle: The right ventricular size is normal. No increase in right ventricular wall thickness. Right ventricular systolic function is normal. Left Atrium: Left atrial size was normal in size. Right Atrium: Right atrial size was normal in size. Pericardium: There is no evidence of pericardial effusion. Mitral Valve: The mitral valve is normal in structure. There is moderate thickening of the mitral valve leaflet(s). Mild mitral annular calcification. Mild mitral valve regurgitation. Tricuspid Valve: The tricuspid valve is normal in structure. Tricuspid valve regurgitation is mild. Aortic Valve: The aortic valve is normal in structure. Aortic valve regurgitation is not visualized. Mild aortic valve sclerosis is present, with no evidence of aortic valve stenosis. Aortic valve mean gradient measures 3.0 mmHg. Aortic valve peak gradient measures 4.4 mmHg. Aortic valve area, by VTI measures 2.34 cm. Pulmonic Valve: The pulmonic valve was normal in structure. Pulmonic valve regurgitation is not visualized. Aorta: The ascending aorta was not well visualized. IAS/Shunts: No atrial level shunt detected by color flow Doppler. Agitated saline contrast was given intravenously to evaluate for intracardiac shunting.  LEFT VENTRICLE PLAX 2D LVIDd:         3.53 cm  Diastology LVIDs:         2.08 cm  LV e' medial:    7.07 cm/s LV PW:         1.07 cm  LV E/e' medial:  7.5 LV IVS:        1.05 cm  LV e' lateral:   6.31 cm/s LVOT diam:     2.00 cm  LV E/e' lateral: 8.4 LV SV:         41 LV SV Index:   28 LVOT Area:     3.14 cm  RIGHT VENTRICLE RV Basal diam:  3.39 cm LEFT ATRIUM           Index       RIGHT ATRIUM           Index LA diam:      2.00 cm 1.37 cm/m  RA Area:     11.10 cm LA Vol (A2C): 9.5 ml  6.53 ml/m  RA Volume:   24.00 ml  16.47 ml/m LA Vol (A4C): 20.2 ml 13.86 ml/m  AORTIC VALVE                   PULMONIC  VALVE AV Area (Vmax):    2.05 cm    PV Vmax:        0.53 m/s AV Area (Vmean):   1.90 cm    PV Peak grad:   1.1 mmHg AV Area (VTI):     2.34 cm    RVOT Peak grad: 2 mmHg AV Vmax:           105.00 cm/s AV Vmean:          76.200 cm/s AV VTI:  0.173 m AV Peak Grad:      4.4 mmHg AV Mean Grad:      3.0 mmHg LVOT Vmax:         68.50 cm/s LVOT Vmean:        46.100 cm/s LVOT VTI:          0.129 m LVOT/AV VTI ratio: 0.75  AORTA Ao Root diam: 2.82 cm MITRAL VALVE               TRICUSPID VALVE MV Area (PHT): 4.80 cm    TR Peak grad:   19.4 mmHg MV Decel Time: 158 msec    TR Vmax:        220.00 cm/s MV E velocity: 52.70 cm/s MV A velocity: 54.40 cm/s  SHUNTS MV E/A ratio:  0.97        Systemic VTI:  0.13 m                            Systemic Diam: 2.00 cm Yolonda Kida MD Electronically signed by Yolonda Kida MD Signature Date/Time: 06/15/2021/5:25:48 PM    Final       Today   Subjective    Jazzlene Rauch today has no headache,no chest abdominal pain,no new weakness tingling or numbness, feels much better wants to go home today.    Objective   Blood pressure 138/85, pulse 82, temperature (!) 97.5 F (36.4 C), temperature source Oral, resp. rate 18, height '5\' 4"'$  (1.626 m), weight 45.4 kg, SpO2 94 %.   Intake/Output Summary (Last 24 hours) at 06/16/2021 0821 Last data filed at 06/15/2021 1855 Gross per 24 hour  Intake 660 ml  Output --  Net 660 ml    Exam  Awake Alert, No new F.N deficits, Normal affect [place].AT,PERRAL Supple Neck,No JVD, No cervical lymphadenopathy appriciated.  Symmetrical Chest wall movement, Good air movement bilaterally, CTAB RRR,No Gallops,Rubs or new Murmurs, No Parasternal Heave +ve B.Sounds, Abd Soft, Non tender, No organomegaly appriciated, No rebound -guarding or rigidity. No Cyanosis, Clubbing or edema, No new Rash or bruise   Data Review   CBC w Diff:  Lab Results  Component Value Date   WBC 6.1 06/13/2021   HGB 16.0 (H) 06/13/2021   HGB 13.7  09/27/2013   HGB 15.7 03/28/2009   HCT 47.7 (H) 06/13/2021   HCT 41.7 09/27/2013   HCT 45.8 03/28/2009   PLT 293 06/13/2021   PLT 185 09/27/2013   PLT 159 03/28/2009   LYMPHOPCT 32.8 01/18/2017   LYMPHOPCT 35.1 09/27/2013   LYMPHOPCT 2.9 (L) 03/28/2009   MONOPCT 12.3 (H) 01/18/2017   MONOPCT 12.4 09/27/2013   MONOPCT 9.9 03/28/2009   EOSPCT 2.8 01/18/2017   EOSPCT 5.0 09/27/2013   EOSPCT 0.5 03/28/2009   BASOPCT 1.0 01/18/2017   BASOPCT 1.1 09/27/2013   BASOPCT 0.0 03/28/2009    CMP:  Lab Results  Component Value Date   NA 137 06/13/2021   NA 143 09/27/2013   K 4.4 06/13/2021   K 3.9 09/27/2013   CL 101 06/13/2021   CL 104 09/27/2013   CO2 26 06/13/2021   CO2 31 09/27/2013   BUN 17 06/13/2021   BUN 16 09/27/2013   CREATININE 0.58 06/13/2021   CREATININE 0.66 09/27/2013   PROT 6.9 10/26/2020   PROT 7.0 03/31/2013   ALBUMIN 4.0 10/26/2020   ALBUMIN 3.8 03/31/2013   BILITOT 0.6 10/26/2020   BILITOT 0.8 03/31/2013   ALKPHOS 79 10/26/2020  ALKPHOS 101 03/31/2013   AST 12 (L) 10/26/2020   AST 19 03/31/2013   ALT 13 10/26/2020   ALT 26 03/31/2013  . Lab Results  Component Value Date   HGBA1C 5.2 06/13/2021   Lab Results  Component Value Date   CHOL 147 07/20/2019   HDL 84.60 07/20/2019   LDLCALC 41 07/20/2019   LDLDIRECT 57.8 06/13/2021   TRIG 107.0 07/20/2019   CHOLHDL 2 07/20/2019     Total Time in preparing paper work, data evaluation and todays exam - 66 minutes  Lala Lund M.D on 06/16/2021 at 8:21 AM  [facility]

## 2021-06-16 NOTE — Plan of Care (Signed)
Following up on consultation from my colleague Dr. Quinn Axe. Asked to follow-up the echocardiogram.  Report pasted below  2D echo done on 06/15/2021. IMPRESSIONS     1. Left ventricular ejection fraction, by estimation, is 70 to 75%. The  left ventricle has hyperdynamic function. The left ventricle has no  regional wall motion abnormalities. There is mild left ventricular  hypertrophy. Left ventricular diastolic  parameters were normal.   2. Right ventricular systolic function is normal. The right ventricular  size is normal.   3. The mitral valve is normal in structure. Mild mitral valve  regurgitation.   4. The aortic valve is normal in structure. Aortic valve regurgitation is  not visualized. Mild aortic valve sclerosis is present, with no evidence  of aortic valve stenosis.   No new recommendations-see full set of recommendations and duration consultation note Plan relayed to Dr. Candiss Norse  -- Amie Portland, MD Neurologist [facility] Pager: [phone]

## 2021-06-16 NOTE — Progress Notes (Signed)
Discharge education and materials provided to patient and husband, verbalized understanding. IV access and telemetry monitor removed, no issues noted. Patient left unit with all belongings via wheelchair to private vehicle.

## 2021-06-17 ENCOUNTER — Encounter (HOSPITAL_COMMUNITY): Payer: Self-pay | Admitting: Emergency Medicine

## 2021-06-17 ENCOUNTER — Emergency Department (HOSPITAL_COMMUNITY)
Admission: EM | Admit: 2021-06-17 | Discharge: 2021-06-17 | Disposition: A | Discharge status: Home / Self Care | Payer: Medicare Other | Attending: Emergency Medicine | Admitting: Emergency Medicine

## 2021-06-17 DIAGNOSIS — R42 Dizziness and giddiness: Secondary | ICD-10-CM | POA: Insufficient documentation

## 2021-06-17 DIAGNOSIS — Z85048 Personal history of other malignant neoplasm of rectum, rectosigmoid junction, and anus: Secondary | ICD-10-CM | POA: Diagnosis not present

## 2021-06-17 DIAGNOSIS — I10 Essential (primary) hypertension: Secondary | ICD-10-CM | POA: Insufficient documentation

## 2021-06-17 DIAGNOSIS — E876 Hypokalemia: Secondary | ICD-10-CM | POA: Diagnosis not present

## 2021-06-17 DIAGNOSIS — Z7982 Long term (current) use of aspirin: Secondary | ICD-10-CM | POA: Insufficient documentation

## 2021-06-17 DIAGNOSIS — Z85038 Personal history of other malignant neoplasm of large intestine: Secondary | ICD-10-CM | POA: Insufficient documentation

## 2021-06-17 DIAGNOSIS — Z79899 Other long term (current) drug therapy: Secondary | ICD-10-CM | POA: Diagnosis not present

## 2021-06-17 LAB — CBC WITH DIFFERENTIAL/PLATELET
Abs Immature Granulocytes: 0.02 10*3/uL (ref 0.00–0.07)
Basophils Absolute: 0 10*3/uL (ref 0.0–0.1)
Basophils Relative: 1 %
Eosinophils Absolute: 0.1 10*3/uL (ref 0.0–0.5)
Eosinophils Relative: 2 %
HCT: 46.4 % — ABNORMAL HIGH (ref 36.0–46.0)
Hemoglobin: 14.8 g/dL (ref 12.0–15.0)
Immature Granulocytes: 0 %
Lymphocytes Relative: 21 %
Lymphs Abs: 1.3 10*3/uL (ref 0.7–4.0)
MCH: 29.7 pg (ref 26.0–34.0)
MCHC: 31.9 g/dL (ref 30.0–36.0)
MCV: 93.2 fL (ref 80.0–100.0)
Monocytes Absolute: 0.7 10*3/uL (ref 0.1–1.0)
Monocytes Relative: 11 %
Neutro Abs: 4.1 10*3/uL (ref 1.7–7.7)
Neutrophils Relative %: 65 %
Platelets: 346 10*3/uL (ref 150–400)
RBC: 4.98 MIL/uL (ref 3.87–5.11)
RDW: 14.5 % (ref 11.5–15.5)
WBC: 6.3 10*3/uL (ref 4.0–10.5)
nRBC: 0 % (ref 0.0–0.2)

## 2021-06-17 LAB — COMPREHENSIVE METABOLIC PANEL
ALT: 19 U/L (ref 0–44)
AST: 15 U/L (ref 15–41)
Albumin: 3.8 g/dL (ref 3.5–5.0)
Alkaline Phosphatase: 58 U/L (ref 38–126)
Anion gap: 7 (ref 5–15)
BUN: 32 mg/dL — ABNORMAL HIGH (ref 8–23)
CO2: 30 mmol/L (ref 22–32)
Calcium: 10 mg/dL (ref 8.9–10.3)
Chloride: 97 mmol/L — ABNORMAL LOW (ref 98–111)
Creatinine, Ser: 0.76 mg/dL (ref 0.44–1.00)
GFR, Estimated: >60 mL/min (ref >60)
Glucose, Bld: 114 mg/dL — ABNORMAL HIGH (ref 70–99)
Potassium: 4 mmol/L (ref 3.5–5.1)
Sodium: 134 mmol/L — ABNORMAL LOW (ref 135–145)
Total Bilirubin: 0.9 mg/dL (ref 0.3–1.2)
Total Protein: 6.5 g/dL (ref 6.5–8.1)

## 2021-06-17 MED ORDER — MECLIZINE HCL 25 MG PO TABS
12.5000 mg | ORAL_TABLET | Freq: Once | ORAL | Status: AC
  Administered 2021-06-17: 12.5 mg via ORAL
  Filled 2021-06-17: qty 1

## 2021-06-17 MED ORDER — SODIUM CHLORIDE 0.9 % IV BOLUS
1000.0000 mL | Freq: Once | INTRAVENOUS | Status: AC
  Administered 2021-06-17: 1000 mL via INTRAVENOUS

## 2021-06-17 NOTE — Discharge Instructions (Signed)
It was our pleasure to provide your ER care today - we hope that you feel better.  Drink plenty of fluids/make sure to stay well hydrated.   You may take your  antivert med as need for dizziness - may make drowsy. Fall precautions.   Follow up with neurology in coming week - call office to arrange appointment.   Also follow up with primary care doctor in the next 1-2 weeks - your blood pressure is high today, have it rechecked then.   Return to ER if worse, new symptoms, fevers, new or severe pain, chest pain, trouble breathing, severe dizziness, numbness/weakness, change in speech or vision, or other concern.

## 2021-06-17 NOTE — ED Provider Notes (Signed)
[facility] EMERGENCY DEPARTMENT Provider Note   CSN: [id number] Arrival date & time: 06/17/21  1116     History Chief Complaint  Patient presents with   Hypertension   Dizziness    [name] is a 87 y.o. female.  Patient c/o intermittent dizziness in past week - symptoms acute onset, worse w certain movements and positional changes, moderate, recurrent, persistent. Describes both a sense of mild lightheadedness and slight sense of room spinning. No nausea or vomiting. No change in hearing or tinnitus. No change in speech or vision. No numbness/weakness. No falls or loss of normal functional ability. States was seen at [facility] for same and imaging negative - since then symptoms persist, but no new or different symptoms. No fever or chills. No blood loss, rectal bleeding or melena. No chest pain or sob. No abd pain or nvd. No gu c/o.  The history is provided by the patient, the spouse and medical records.      Past Medical History:  Diagnosis Date   Allergic rhinitis, cause unspecified    Diarrhea    Edema    History of colon cancer    s/p surgery, chemotherapy   Internal hemorrhoids without mention of complication    Malignant neoplasm of rectum (HCC)    Osteoarthrosis, unspecified whether generalized or localized, unspecified site    Osteoporosis, unspecified    Other acute reactions to stress    Other and unspecified hyperlipidemia    Other screening mammogram    Pain in limb    Special screening for osteoporosis    Ulceration of vulva, unspecified    Unspecified essential hypertension     Patient Active Problem List   Diagnosis Date Noted   History of stroke involving cerebellum    Abnormal head CT    Dizziness 06/13/2021   Essential hypertension 06/13/2021   Right-sided low back pain without sciatica 01/25/2019   Anterior neck pain 07/23/2016   Peripheral edema 07/23/2016   Hypokalemia 01/27/2016   Abnormal weight loss 01/27/2016    Counseling regarding end of life decision making 01/24/2015   Vitamin D deficiency 12/12/2012   Chronic diarrhea 10/27/2009   RECTAL CANCER 07/23/2009   ALLERGIC RHINITIS 01/16/2009   HEMORRHOIDS, INTERNAL 11/20/2008   ANXIETY, SITUATIONAL 05/31/2007   Hyperlipidemia 05/30/2007   Essential hypertension, benign 05/30/2007   Osteoarthritis of knees, bilateral 05/30/2007   At high risk for osteoporosis, no DEXA 05/30/2007    Past Surgical History:  Procedure Laterality Date   dental implants     PARTIAL COLECTOMY  9/10   temporary colostomy     OB History   No obstetric history on file.     Family History  Problem Relation Age of Onset   Stroke Mother    Arthritis Mother    Arthritis Brother    Depression Sister     Social History   Tobacco Use   Smoking status: Never   Smokeless tobacco: Never  Vaping Use   Vaping Use: Never used  Substance Use Topics   Alcohol use: No    Alcohol/week: 0.0 standard drinks   Drug use: Yes    Types: Opium    Home Medications Prior to Admission medications   Medication Sig Start Date End Date Taking? Authorizing Provider  aspirin EC 81 MG EC tablet Take 1 tablet (81 mg total) by mouth daily. Swallow whole. 06/17/21   Thurnell Lose, MD  chlorthalidone (HYGROTON) 25 MG tablet Take 1 tablet (25 mg total)  by mouth daily. 07/18/19   Bedsole, Amy E, MD  Cholecalciferol (VITAMIN D PO) Take 5,000 Units by mouth daily.    [provider]  cloNIDine (CATAPRES) 0.1 MG tablet Take 1 tablet (0.1 mg total) by mouth daily as needed (SBP > 150). 06/15/21 06/15/22  Thurnell Lose, MD  colestipol (COLESTID) 1 G tablet Take 2 g by mouth 3 (three) times daily.     [provider]  diltiazem (CARDIZEM CD) 240 MG 24 hr capsule Take 1 capsule (240 mg total) by mouth daily. 07/16/19   Bedsole, Amy E, MD  diltiazem (DILACOR XR) 240 MG 24 hr capsule Take 1 capsule (240 mg total) by mouth daily. 06/03/21 07/03/21  Duffy Bruce, MD   meclizine (ANTIVERT) 25 MG tablet Take 1 tablet (25 mg total) by mouth 3 (three) times daily as needed for dizziness. 06/16/21   Thurnell Lose, MD  Opium 10 MG/ML (1%) TINC Please take 0.2-0.4 mL three times per day 12/25/18   [provider]  paregoric 2 MG/5ML solution Take 5 mLs by mouth as needed for diarrhea or loose stools. Patient not taking: Reported on 06/13/2021    [provider]    Allergies    Patient has no known allergies.  Review of Systems   Review of Systems  Constitutional:  Negative for chills and fever.  HENT:  Negative for congestion, ear pain, hearing loss and sore throat.   Eyes:  Negative for redness and visual disturbance.  Respiratory:  Negative for cough and shortness of breath.   Cardiovascular:  Negative for chest pain.  Gastrointestinal:  Negative for abdominal pain, blood in stool and vomiting.  Genitourinary:  Negative for flank pain.  Musculoskeletal:  Negative for back pain and neck pain.  Skin:  Negative for rash.  Neurological:  Positive for dizziness and light-headedness. Negative for headaches.  Hematological:  Does not bruise/bleed easily.  Psychiatric/Behavioral:  Negative for confusion.    Physical Exam Updated Vital Signs BP (!) 164/81   Pulse 91   Temp 98.1 F (36.7 C)   Resp 18   SpO2 98%   Physical Exam Vitals and nursing note reviewed.  Constitutional:      Appearance: Normal appearance. She is well-developed.  HENT:     Head: Atraumatic.     Right Ear: Tympanic membrane normal.     Left Ear: Tympanic membrane normal.     Nose: Nose normal.     Mouth/Throat:     Mouth: Mucous membranes are moist.  Eyes:     General: No scleral icterus.    Extraocular Movements: Extraocular movements intact.     Conjunctiva/sclera: Conjunctivae normal.     Pupils: Pupils are equal, round, and reactive to light.  Neck:     Vascular: No carotid bruit.     Trachea: No tracheal deviation.  Cardiovascular:     Rate and  Rhythm: Normal rate and regular rhythm.     Pulses: Normal pulses.     Heart sounds: Normal heart sounds. No murmur heard.   No friction rub. No gallop.  Pulmonary:     Effort: Pulmonary effort is normal. No respiratory distress.     Breath sounds: Normal breath sounds.  Abdominal:     General: Bowel sounds are normal. There is no distension.     Palpations: Abdomen is soft.     Tenderness: There is no abdominal tenderness.  Genitourinary:    Comments: No cva tenderness.  Musculoskeletal:  General: No swelling or tenderness.     Cervical back: Normal range of motion and neck supple. No rigidity. No muscular tenderness.     Right lower leg: No edema.     Left lower leg: No edema.  Skin:    General: Skin is warm and dry.     Findings: No rash.  Neurological:     Mental Status: She is alert.     Comments: Alert, speech normal. No dysarthria or aphasia. Motor/sens grossly intact bil.   Psychiatric:        Mood and Affect: Mood normal.    ED Results / Procedures / Treatments   Labs (all labs ordered are listed, but only abnormal results are displayed) Results for orders placed or performed during the hospital encounter of 06/17/21  CBC with Differential  Result Value Ref Range   WBC 6.3 4.0 - 10.5 K/uL   RBC 4.98 3.87 - 5.11 MIL/uL   Hemoglobin 14.8 12.0 - 15.0 g/dL   HCT 46.4 (H) 36.0 - 46.0 %   MCV 93.2 80.0 - 100.0 fL   MCH 29.7 26.0 - 34.0 pg   MCHC 31.9 30.0 - 36.0 g/dL   RDW 14.5 11.5 - 15.5 %   Platelets 346 150 - 400 K/uL   nRBC 0.0 0.0 - 0.2 %   Neutrophils Relative % 65 %   Neutro Abs 4.1 1.7 - 7.7 K/uL   Lymphocytes Relative 21 %   Lymphs Abs 1.3 0.7 - 4.0 K/uL   Monocytes Relative 11 %   Monocytes Absolute 0.7 0.1 - 1.0 K/uL   Eosinophils Relative 2 %   Eosinophils Absolute 0.1 0.0 - 0.5 K/uL   Basophils Relative 1 %   Basophils Absolute 0.0 0.0 - 0.1 K/uL   Immature Granulocytes 0 %   Abs Immature Granulocytes 0.02 0.00 - 0.07 K/uL  Comprehensive  metabolic panel  Result Value Ref Range   Sodium 134 (L) 135 - 145 mmol/L   Potassium 4.0 3.5 - 5.1 mmol/L   Chloride 97 (L) 98 - 111 mmol/L   CO2 30 22 - 32 mmol/L   Glucose, Bld 114 (H) 70 - 99 mg/dL   BUN 32 (H) 8 - 23 mg/dL   Creatinine, Ser 0.76 0.44 - 1.00 mg/dL   Calcium 10.0 8.9 - 10.3 mg/dL   Total Protein 6.5 6.5 - 8.1 g/dL   Albumin 3.8 3.5 - 5.0 g/dL   AST 15 15 - 41 U/L   ALT 19 0 - 44 U/L   Alkaline Phosphatase 58 38 - 126 U/L   Total Bilirubin 0.9 0.3 - 1.2 mg/dL   GFR, Estimated >60 >60 mL/min   Anion gap 7 5 - 15   CT HEAD WO CONTRAST (5MM)  Result Date: 06/03/2021 CLINICAL DATA:  Headache, chronic, new features or increased frequency EXAM: CT HEAD WITHOUT CONTRAST TECHNIQUE: Contiguous axial images were obtained from the base of the skull through the vertex without intravenous contrast. COMPARISON:  None. FINDINGS: Brain: There is no acute intracranial hemorrhage, mass effect, or edema. Gray-white differentiation is preserved. There is no extra-axial fluid collection. Patchy hypoattenuation in the supratentorial white matter is nonspecific but probably reflects moderate chronic microvascular ischemic changes. Prominence of the ventricles and sulci reflects generalized parenchymal volume loss. Vascular: There is atherosclerotic calcification at the skull base. Skull: Calvarium is unremarkable. Sinuses/Orbits: No acute finding. Other: None. IMPRESSION: No acute intracranial abnormality. Chronic microvascular ischemic changes. Electronically Signed   By: Addison Lank.D.  On: 06/03/2021 12:48   MR ANGIO HEAD WO CONTRAST  Result Date: 06/13/2021 CLINICAL DATA:  Neuro deficit, acute, stroke suspected EXAM: MRA HEAD WITHOUT CONTRAST TECHNIQUE: Angiographic images of the Circle of Willis were acquired using MRA technique without intravenous contrast. COMPARISON:  No pertinent prior exam. FINDINGS: POSTERIOR CIRCULATION: --Vertebral arteries: Normal --Inferior cerebellar  arteries: Normal. --Basilar artery: Normal. --Superior cerebellar arteries: Normal. --Posterior cerebral arteries: Normal. ANTERIOR CIRCULATION: --Intracranial internal carotid arteries: Normal. --Anterior cerebral arteries (ACA): Normal. --Middle cerebral arteries (MCA): Normal. ANATOMIC VARIANTS: None IMPRESSION: Normal intracranial MRA. Electronically Signed   By: Ulyses Jarred M.D.   On: 06/13/2021 22:17   MR ANGIO NECK W WO CONTRAST  Result Date: 06/13/2021 CLINICAL DATA:  Carotid artery stenosis EXAM: MRA NECK WITHOUT AND WITH CONTRAST TECHNIQUE: Multiplanar and multiecho pulse sequences of the neck were obtained without and with intravenous contrast. Angiographic images of the neck were obtained using MRA technique without and with intravenous contrast. CONTRAST:  10m GADAVIST GADOBUTROL 1 MMOL/ML IV SOLN COMPARISON:  None. FINDINGS: Normal proximal arch vessels, carotid systems and vertebral arteries. IMPRESSION: Normal MRA of the neck. Electronically Signed   By: KUlyses JarredM.D.   On: 06/13/2021 22:22   MR BRAIN WO CONTRAST  Result Date: 06/13/2021 CLINICAL DATA:  Dizziness, nonspecific EXAM: MRI HEAD WITHOUT CONTRAST TECHNIQUE: Multiplanar, multiecho pulse sequences of the brain and surrounding structures were obtained without intravenous contrast. COMPARISON:  None. FINDINGS: Brain: There is no acute infarction or intracranial hemorrhage. There is no intracranial mass, mass effect, or edema. There is no hydrocephalus or extra-axial fluid collection. Patchy and confluent areas of T2 hyperintensity in the supratentorial and pontine white matter are nonspecific but probably reflect moderate chronic microvascular ischemic changes. Small chronic right parietal cortical infarct. Small chronic right cerebellar infarct. Prominence of the ventricles and sulci reflects parenchymal volume loss. Vascular: Major vessel flow voids at the skull base are preserved. Skull and upper cervical spine: Nonspecific  T2 hyperintense lesion of the right frontal calvarium. No aggressive features on the prior CT. Marrow signal is otherwise within normal limits. Sinuses/Orbits: Paranasal sinuses are aerated. Orbits are unremarkable. Other: Sella is unremarkable.  Mastoid air cells are clear. IMPRESSION: No evidence of recent infarction, hemorrhage, or mass. Moderate chronic microvascular ischemic changes. 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 Right frontal calvarium lesion. No aggressive features on the prior head CT but remains indeterminate given history of malignancy. Electronically Signed   By: PMacy MisM.D.   On: 06/13/2021 12:47   MR BRAIN W CONTRAST  Result Date: 06/13/2021 CLINICAL DATA:  Calvarial lesion EXAM: MRI HEAD WITH CONTRAST TECHNIQUE: Multiplanar, multiecho pulse sequences of the brain and surrounding structures were obtained with intravenous contrast. CONTRAST:  542mGADAVIST GADOBUTROL 1 MMOL/ML IV SOLN COMPARISON:  Brain MRI 06/13/2021 Head CT 06/03/2021 FINDINGS: Postcontrast images were obtained supplementary to the earlier noncontrast brain MRI. The right frontal calvarium lesion shows moderate contrast enhancement. 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 Electronically Signed   By: KeUlyses Jarred.D.   On: 06/13/2021 22:15   ECHOCARDIOGRAM COMPLETE BUBBLE STUDY  Result Date: 06/15/2021    ECHOCARDIOGRAM REPORT   Patient Name:   Laurie GUTKOWSKIate of Exam: 06/15/2021 Medical Rec #:  [id number]    Height:       64.0 in Accession #:    [id number]   Weight:       100.0 lb  Date of Birth:  11-19-1932      BSA:          1.457 m Patient Age:    70 years       BP:           137/87 mmHg Patient Gender: F              HR:           78 bpm. Exam Location:  [facility] Procedure: Cardiac Doppler, Color Doppler and Saline Contrast Bubble Study Indications:     Stroke 434.91 /I63.9  History:          Patient has prior history of Echocardiogram examinations, most                  recent 08/02/2016. Edema.  Sonographer:     Sherrie Sport Referring Phys:  [id number] Patrecia Pour STACK Diagnosing Phys: Yolonda Kida MD  Sonographer Comments: Suboptimal apical window. IMPRESSIONS  1. Left ventricular ejection fraction, by estimation, is 70 to 75%. The left ventricle has hyperdynamic function. The left ventricle has no regional wall motion abnormalities. There is mild left ventricular hypertrophy. Left ventricular diastolic parameters were normal.  2. Right ventricular systolic function is normal. The right ventricular size is normal.  3. The mitral valve is normal in structure. Mild mitral valve regurgitation.  4. The aortic valve is normal in structure. Aortic valve regurgitation is not visualized. Mild aortic valve sclerosis is present, with no evidence of aortic valve stenosis. FINDINGS  Left Ventricle: Left ventricular ejection fraction, by estimation, is 70 to 75%. The left ventricle has hyperdynamic function. The left ventricle has no regional wall motion abnormalities. The left ventricular internal cavity size was normal in size. There is mild left ventricular hypertrophy. Left ventricular diastolic parameters were normal. Right Ventricle: The right ventricular size is normal. No increase in right ventricular wall thickness. Right ventricular systolic function is normal. Left Atrium: Left atrial size was normal in size. Right Atrium: Right atrial size was normal in size. Pericardium: There is no evidence of pericardial effusion. Mitral Valve: The mitral valve is normal in structure. There is moderate thickening of the mitral valve leaflet(s). Mild mitral annular calcification. Mild mitral valve regurgitation. Tricuspid Valve: The tricuspid valve is normal in structure. Tricuspid valve regurgitation is mild. Aortic Valve: The aortic valve is normal in structure. Aortic valve regurgitation is not visualized. Mild  aortic valve sclerosis is present, with no evidence of aortic valve stenosis. Aortic valve mean gradient measures 3.0 mmHg. Aortic valve peak gradient measures 4.4 mmHg. Aortic valve area, by VTI measures 2.34 cm. Pulmonic Valve: The pulmonic valve was normal in structure. Pulmonic valve regurgitation is not visualized. Aorta: The ascending aorta was not well visualized. IAS/Shunts: No atrial level shunt detected by color flow Doppler. Agitated saline contrast was given intravenously to evaluate for intracardiac shunting.  LEFT VENTRICLE PLAX 2D LVIDd:         3.53 cm  Diastology LVIDs:         2.08 cm  LV e' medial:    7.07 cm/s LV PW:         1.07 cm  LV E/e' medial:  7.5 LV IVS:        1.05 cm  LV e' lateral:   6.31 cm/s LVOT diam:     2.00 cm  LV E/e' lateral: 8.4 LV SV:         41 LV SV Index:   28 LVOT Area:  3.14 cm  RIGHT VENTRICLE RV Basal diam:  3.39 cm LEFT ATRIUM           Index       RIGHT ATRIUM           Index LA diam:      2.00 cm 1.37 cm/m  RA Area:     11.10 cm LA Vol (A2C): 9.5 ml  6.53 ml/m  RA Volume:   24.00 ml  16.47 ml/m LA Vol (A4C): 20.2 ml 13.86 ml/m  AORTIC VALVE                   PULMONIC VALVE AV Area (Vmax):    2.05 cm    PV Vmax:        0.53 m/s AV Area (Vmean):   1.90 cm    PV Peak grad:   1.1 mmHg AV Area (VTI):     2.34 cm    RVOT Peak grad: 2 mmHg AV Vmax:           105.00 cm/s AV Vmean:          76.200 cm/s AV VTI:            0.173 m AV Peak Grad:      4.4 mmHg AV Mean Grad:      3.0 mmHg LVOT Vmax:         68.50 cm/s LVOT Vmean:        46.100 cm/s LVOT VTI:          0.129 m LVOT/AV VTI ratio: 0.75  AORTA Ao Root diam: 2.82 cm MITRAL VALVE               TRICUSPID VALVE MV Area (PHT): 4.80 cm    TR Peak grad:   19.4 mmHg MV Decel Time: 158 msec    TR Vmax:        220.00 cm/s MV E velocity: 52.70 cm/s MV A velocity: 54.40 cm/s  SHUNTS MV E/A ratio:  0.97        Systemic VTI:  0.13 m                            Systemic Diam: 2.00 cm Yolonda Kida MD Electronically  signed by Yolonda Kida MD Signature Date/Time: 06/15/2021/5:25:48 PM    Final     EKG EKG Interpretation  Date/Time:  Wednesday June 17 2021 12:18:50 EDT Ventricular Rate:  78 PR Interval:  240 QRS Duration: 80 QT Interval:  370 QTC Calculation: 421 R Axis:   70 Text Interpretation: Sinus rhythm with 1st degree A-V block Possible Anterior infarct , age undetermined Abnormal ECG No significant change since last tracing Confirmed by Wandra Arthurs [phone]) on 06/17/2021 6:57:18 PM  Radiology No results found.  Procedures Procedures   Medications Ordered in ED Medications  sodium chloride 0.9 % bolus 1,000 mL (1,000 mLs Intravenous New Bag/Given 06/17/21 2127)  meclizine (ANTIVERT) tablet 12.5 mg (12.5 mg Oral Given 06/17/21 2128)    ED Course  I have reviewed the triage vital signs and the nursing notes.  Pertinent labs & imaging results that were available during my care of the patient were reviewed by me and considered in my medical decision making (see chart for details).    MDM Rules/Calculators/A&P                          Iv  ns. Stat labs.   Reviewed nursing notes and prior charts for additional history.  Reviewed recent labs and imaging - mri/mras for same symptoms negative for cva.  Antivert po, po fluids.   Labs reviewed/interpreted by me - wbc normal. Hgb normal.   Ambulate in hall.  On recheck, no dizziness. No nausea/vomiting. No numbness/weakness. No change in speech or vision. No pain.  Pt currently appears stable for d/c.   Rec pcp/neurology f/u.  Return precautions provided.     Final Clinical Impression(s) / ED Diagnoses Final diagnoses:  None    Rx / DC Orders ED Discharge Orders     None        Lajean Saver, MD 06/17/21 2227

## 2021-06-17 NOTE — ED Notes (Signed)
Water given  

## 2021-06-17 NOTE — ED Provider Notes (Signed)
Emergency Medicine Provider Triage Evaluation Note  [name] , a 87 y.o. female  was evaluated in triage.  Pt complains of dizziness that occurs every time he stands.  Patient was evaluated at [facility], admitted recently discharged approximately 4 days ago.  Reports after arriving home, she continues to feel dizzy, states that she has to hold onto the wall as she is unable to ambulate.  States her blood pressure has been running very high at home, she was given clonidine unknown dosage by her husband, blood pressure is normal on today's visit.  No prior history of vertigo.  Denies any pain or any other symptoms at this time.  Review of Systems  Positive: dizziness Negative: Fever, headache, neck pain, chest pain  Physical Exam  BP 134/77   Pulse 82   Temp 98.1 F (36.7 C)   Resp 16   SpO2 98%  Gen:   Awake, no distress   Resp:  Normal effort  MSK:   Moves extremities without difficulty  Other:    Medical Decision Making  Medically screening exam initiated at 12:16 PM.  Appropriate orders placed.  [name] was informed that the remainder of the evaluation will be completed by another provider, this initial triage assessment does not replace that evaluation, and the importance of remaining in the ED until their evaluation is complete.  Patient was admitted for dizziness on August 20.  Had a normal MRI aside from a lesion on the right calvarium.  Prior history of colon cancer in the past but reports this is on remission.  Labs have been ordered she appears stable in triage.   Janeece Fitting, PA-C 06/17/21 1219    Daleen Bo, MD 06/17/21 [phone]

## 2021-06-17 NOTE — ED Triage Notes (Signed)
Pt here from home with c/o dizziness was just d/c from [facility] 4 days ago for same

## 2021-06-25 ENCOUNTER — Other Ambulatory Visit: Payer: Self-pay

## 2021-06-25 ENCOUNTER — Encounter: Payer: Self-pay | Admitting: Family Medicine

## 2021-06-25 ENCOUNTER — Ambulatory Visit (INDEPENDENT_AMBULATORY_CARE_PROVIDER_SITE_OTHER): Payer: Medicare Other | Admitting: Family Medicine

## 2021-06-25 VITALS — BP 146/82 | HR 57 | Temp 97.2°F | Ht 63.5 in | Wt 102.0 lb

## 2021-06-25 DIAGNOSIS — R42 Dizziness and giddiness: Secondary | ICD-10-CM

## 2021-06-25 DIAGNOSIS — G939 Disorder of brain, unspecified: Secondary | ICD-10-CM | POA: Diagnosis not present

## 2021-06-25 DIAGNOSIS — M8508 Fibrous dysplasia (monostotic), other site: Secondary | ICD-10-CM

## 2021-06-25 DIAGNOSIS — I10 Essential (primary) hypertension: Secondary | ICD-10-CM

## 2021-06-25 MED ORDER — LOSARTAN POTASSIUM 25 MG PO TABS: 25.0000 mg | ORAL_TABLET | Freq: Every day | ORAL | 11 refills | Status: DC

## 2021-06-25 NOTE — Patient Instructions (Addendum)
We will refer to neurology.  Continue diltiazem 240 mg Daily  Start  losartan 25 mg  at bedtime.  Follow BP at home, goal < 150/90.

## 2021-06-25 NOTE — Progress Notes (Signed)
Patient ID: [name], female    DOB: 1933/08/10, 87 y.o.   MRN: [id number]  This visit was conducted in person.  BP (!) 146/82   Pulse (!) 57   Temp (!) 97.2 F (36.2 C) (Temporal)   Ht 5' 3.5" (1.613 m)   Wt 102 lb (46.3 kg)   SpO2 92%   BMI 17.79 kg/m    CC: Chief Complaint  Patient presents with   Hospitalization Follow-up    Subjective:   HPI: [name] is a 87 y.o. female presenting on 06/25/2021 for Hospitalization Follow-up   Admitted 8/20-8/23 for dizziness unclear cause. Room spinning , off balance  Lab reviewed and unremarkable. Na was slightly low at 134  CT head: No acute intracranial abnormality. Chronic microvascular ischemic changes. 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  With  PT OT  dizziness is much improved after supportive care, stable echocardiogram, cleared by neurology PT and OT to go home, will be discharged home with outpatient PT follow-up along with outpatient neurology follow-up within a month, have given her as needed Antivert in case she develops any vertigo,   BP was stable but per husband report had had fluctuations so given catapres to use prn. 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  Repeat CT scan in 2 to 3 months, outpatient neurology follow-up within 2 months.  PCP to arrange. Seen in ER on 8/24 for dizziness x 1 week  Dx with  vertigo and given antivert  Na was slightly low at 134  Recommended BP/cbc and CXR on follow up.. no clear reason for need for CXR.  She had been off BP meds for a while.. for 6 months Did not think she needed it.   Started at one ER visit on ditliazem 240 XR  Pt reports today:  Not really using chlorthalidone, only needed clonidine once BP Readings from Last 3 Encounters:  06/25/21 (!) 146/82  06/17/21 (!) 163/81  06/16/21 138/85    Dizziness went away on  its own now.  Per pt BP at home 158/100-110      Relevant past medical, surgical, family and social history reviewed and updated as indicated. Interim medical history since our last visit reviewed. Allergies and medications reviewed and updated. Outpatient Medications Prior to Visit  Medication Sig Dispense Refill   aspirin EC 81 MG EC tablet Take 1 tablet (81 mg total) by mouth daily. Swallow whole. 30 tablet 0   chlorthalidone (HYGROTON) 25 MG tablet Take 1 tablet (25 mg total) by mouth daily. 30 tablet 5   Cholecalciferol (VITAMIN D PO) Take 5,000 Units by mouth daily.     cloNIDine (CATAPRES) 0.1 MG tablet Take 1 tablet (0.1 mg total) by mouth daily as needed (SBP > 150). 30 tablet 0   colestipol (COLESTID) 1 G tablet Take 2 g by mouth 3 (three) times daily.      diltiazem (DILACOR XR) 240 MG 24 hr capsule Take 1 capsule (240 mg total) by mouth daily. 30 capsule 0   meclizine (ANTIVERT) 25 MG tablet Take 1 tablet (25 mg total) by mouth 3 (three) times daily as needed for dizziness. 30 tablet 0   Opium 10 MG/ML (1%) TINC Please take 0.2-0.4 mL three times per day     diltiazem (CARDIZEM CD) 240 MG 24 hr capsule Take 1 capsule (240 mg total) by mouth daily. (Patient not  taking: Reported on 06/25/2021) 90 capsule 3   paregoric 2 MG/5ML solution Take 5 mLs by mouth as needed for diarrhea or loose stools. (Patient not taking: Reported on 06/13/2021)     No facility-administered medications prior to visit.     Per HPI unless specifically indicated in ROS section below Review of Systems  All other systems reviewed and are negative. Objective:  BP (!) 146/82   Pulse (!) 57   Temp (!) 97.2 F (36.2 C) (Temporal)   Ht 5' 3.5" (1.613 m)   Wt 102 lb (46.3 kg)   SpO2 92%   BMI 17.79 kg/m   Wt Readings from Last 3 Encounters:  06/25/21 102 lb (46.3 kg)  06/13/21 100 lb (45.4 kg)  06/03/21 105 lb (47.6 kg)      Physical Exam Constitutional:      General: She is not in acute distress.     Appearance: Normal appearance. She is well-developed. She is not ill-appearing or toxic-appearing.     Comments: Thin appearing  HENT:     Head: Normocephalic.     Right Ear: Hearing, tympanic membrane, ear canal and external ear normal. Tympanic membrane is not erythematous, retracted or bulging.     Left Ear: Hearing, tympanic membrane, ear canal and external ear normal. Tympanic membrane is not erythematous, retracted or bulging.     Nose: No mucosal edema or rhinorrhea.     Right Sinus: No maxillary sinus tenderness or frontal sinus tenderness.     Left Sinus: No maxillary sinus tenderness or frontal sinus tenderness.     Mouth/Throat:     Pharynx: Uvula midline.  Eyes:     General: Lids are normal. Lids are everted, no foreign bodies appreciated.     Conjunctiva/sclera: Conjunctivae normal.     Pupils: Pupils are equal, round, and reactive to light.  Neck:     Thyroid: No thyroid mass or thyromegaly.     Vascular: No carotid bruit.     Trachea: Trachea normal.  Cardiovascular:     Rate and Rhythm: Normal rate and regular rhythm.     Pulses: Normal pulses.     Heart sounds: Normal heart sounds, S1 normal and S2 normal. No murmur heard.   No friction rub. No gallop.  Pulmonary:     Effort: Pulmonary effort is normal. No tachypnea or respiratory distress.     Breath sounds: Normal breath sounds. No decreased breath sounds, wheezing, rhonchi or rales.  Abdominal:     General: Bowel sounds are normal.     Palpations: Abdomen is soft.     Tenderness: There is no abdominal tenderness.  Musculoskeletal:     Cervical back: Normal range of motion and neck supple.  Skin:    General: Skin is warm and dry.     Findings: No rash.  Neurological:     Mental Status: She is alert.  Psychiatric:        Mood and Affect: Mood is not anxious or depressed.        Speech: Speech normal.        Behavior: Behavior normal. Behavior is cooperative.        Thought Content: Thought content normal.         Judgment: Judgment normal.      Results for orders placed or performed during the hospital encounter of 06/17/21  CBC with Differential  Result Value Ref Range   WBC 6.3 4.0 - 10.5 K/uL   RBC 4.98 3.87 - 5.11  MIL/uL   Hemoglobin 14.8 12.0 - 15.0 g/dL   HCT 46.4 (H) 36.0 - 46.0 %   MCV 93.2 80.0 - 100.0 fL   MCH 29.7 26.0 - 34.0 pg   MCHC 31.9 30.0 - 36.0 g/dL   RDW 14.5 11.5 - 15.5 %   Platelets 346 150 - 400 K/uL   nRBC 0.0 0.0 - 0.2 %   Neutrophils Relative % 65 %   Neutro Abs 4.1 1.7 - 7.7 K/uL   Lymphocytes Relative 21 %   Lymphs Abs 1.3 0.7 - 4.0 K/uL   Monocytes Relative 11 %   Monocytes Absolute 0.7 0.1 - 1.0 K/uL   Eosinophils Relative 2 %   Eosinophils Absolute 0.1 0.0 - 0.5 K/uL   Basophils Relative 1 %   Basophils Absolute 0.0 0.0 - 0.1 K/uL   Immature Granulocytes 0 %   Abs Immature Granulocytes 0.02 0.00 - 0.07 K/uL  Comprehensive metabolic panel  Result Value Ref Range   Sodium 134 (L) 135 - 145 mmol/L   Potassium 4.0 3.5 - 5.1 mmol/L   Chloride 97 (L) 98 - 111 mmol/L   CO2 30 22 - 32 mmol/L   Glucose, Bld 114 (H) 70 - 99 mg/dL   BUN 32 (H) 8 - 23 mg/dL   Creatinine, Ser 0.76 0.44 - 1.00 mg/dL   Calcium 10.0 8.9 - 10.3 mg/dL   Total Protein 6.5 6.5 - 8.1 g/dL   Albumin 3.8 3.5 - 5.0 g/dL   AST 15 15 - 41 U/L   ALT 19 0 - 44 U/L   Alkaline Phosphatase 58 38 - 126 U/L   Total Bilirubin 0.9 0.3 - 1.2 mg/dL   GFR, Estimated >60 >60 mL/min   Anion gap 7 5 - 15    This visit occurred during the SARS-CoV-2 public health emergency.  Safety protocols were in place, including screening questions prior to the visit, additional usage of staff PPE, and extensive cleaning of exam room while observing appropriate contact time as indicated for disinfecting solutions.   COVID 19 screen:  No recent travel or known exposure to COVID19 The patient denies respiratory symptoms of COVID 19 at this time. The importance of social distancing was discussed today.    Assessment and Plan Problem List Items Addressed This Visit     Dizziness    Acute, now resolved with treatment of HTN.      Relevant Orders   Ambulatory referral to Neurology   Essential hypertension, benign    Stable, chronic.   Improved back on medication but not at goal Continue current medication.  Diltiazem 240 mg daily Start  losartan 25 mg  at bedtime.  Follow BP at home, goal < 150/90.   Has not needed prn clonidine  Not taking chlorthalidone... remain off given mild hyponatremia.       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          Eliezer Lofts, MD

## 2021-06-30 ENCOUNTER — Other Ambulatory Visit: Payer: Self-pay | Admitting: Family Medicine

## 2021-07-01 ENCOUNTER — Telehealth: Payer: Self-pay

## 2021-07-01 DIAGNOSIS — G939 Disorder of brain, unspecified: Secondary | ICD-10-CM | POA: Insufficient documentation

## 2021-07-01 DIAGNOSIS — M8508 Fibrous dysplasia (monostotic), other site: Secondary | ICD-10-CM | POA: Insufficient documentation

## 2021-07-01 NOTE — Assessment & Plan Note (Signed)
Repeat CT scan in 2 to 3 months, outpatient neurology follow-up within 2 months.

## 2021-07-01 NOTE — Telephone Encounter (Signed)
[facility] - Client TELEPHONE ADVICE RECORD [facility] Patient Name: [name] Gender: Female DOB: [birth year] Age: 87 Y 2 M 28 D Return Phone Number: [phone] (Primary) Address: City/ State/ Zip: [place] [address] Client [facility] - Client Client Site [place] - Day Physician Eliezer Lofts - MD Contact Type Call Who Is Calling Patient / Member / Family / Caregiver Call Type Triage / Clinical Caller Name Hoang Tal Relationship To Patient Spouse Return Phone Number [phone] (Primary) Chief Complaint Blood Pressure High Reason for Call Symptomatic / Request for [place] states that his wife's blood pressure has been elevated lately, she was seen on 9/1 and put on a new medication that does not seem to be working as the pressures are still elevated. Most recent blood pressure is 160/102 Additional Comment Caller was transferred from the office and the number that they provided for callback for the patient is [phone] that was the number for the husband. The patient provided the primary call back number. Translation No Nurse Assessment Nurse: Doyle Askew, RN, Beth Date/Time (Eastern Time): 07/01/2021 12:01:41 PM Confirm and document reason for call. If symptomatic, describe symptoms. ---Caller states her blood pressure has been elevated lately, she was seen on 9/1 and put on a new medication that does not seem to be working as the pressures are still elevated. Caller states most recent blood pressure is 160/102 - caller states this has been going on for about a week. Does the patient have any new or worsening symptoms? ---Yes Will a triage be completed? ---Yes Related visit to physician within the last 2 weeks? ---Yes Does the PT have any chronic conditions? (i.e. diabetes, asthma, this includes High risk factors for pregnancy,  etc.) ---Yes List chronic conditions. ---HTN Is this a behavioral health or substance abuse call? ---No PLEASE NOTE: All timestamps contained within this report are represented as [place] Standard Time. CONFIDENTIALTY NOTICE: This fax transmission is intended only for the addressee. It contains information that is legally privileged, confidential or otherwise protected from use or disclosure. If you are not the intended recipient, you are strictly prohibited from reviewing, disclosing, copying using or disseminating any of this information or taking any action in reliance on or regarding this information. If you have received this fax in error, please notify us immediately by telephone so that we can arrange for its return to [place]. Phone: [phone], Toll-Free: [phone], Fax: [phone] Page: 2 of 2 Call Id: [id number] Guidelines Guideline Title Affirmed Question Affirmed Notes Nurse Date/Time [name] Time) Blood Pressure - High [id number] Systolic BP >= [id number] OR Diastolic >= 80 AND [id number] pregnant Doyle Askew, RN, Beth 07/01/2021 12:03:21 PM Disp. Time [name] Time) Disposition Final User 07/01/2021 12:06:51 PM SEE PCP WITHIN 3 DAYS Yes Doyle Askew, RN, Beth Caller Disagree/Comply Comply Caller Understands Yes PreDisposition Call Doctor Care Advice Given Per Guideline SEE PCP WITHIN 3 DAYS: * You need to be seen within 2 or 3 days. * PCP VISIT: Call your doctor (or NP/PA) during regular office hours and make an appointment. A clinic or urgent care center are good places to go for care if your doctor's office is closed or you can't get an appointment. NOTE: If office will be open tomorrow, tell caller to call then, not in 3 days. CALL BACK IF: * Weakness or numbness of the face, arm or leg on one side of the body occurs * Difficulty walking, difficulty talking, or  severe headache occurs * Chest pain or difficulty breathing occurs * You become worse * Blurred vision or face swelling occurs CARE ADVICE  given per High Blood Pressure (Adult) guideline. Comments User: Melene Muller, RN Date/Time [name] Time): 07/01/2021 12:05:13 PM Current BP 147/80 Referrals REFERRED TO PCP OFFICE

## 2021-07-01 NOTE — Assessment & Plan Note (Addendum)
Stable, chronic.   Improved back on medication but not at goal Continue current medication.  Diltiazem 240 mg daily Start  losartan 25 mg  at bedtime.  Follow BP at home, goal < 150/90.   Has not needed prn clonidine  Not taking chlorthalidone... remain off given mild hyponatremia.

## 2021-07-01 NOTE — Assessment & Plan Note (Addendum)
Acute, now resolved with treatment of HTN.

## 2021-07-01 NOTE — Telephone Encounter (Signed)
Per access nurse note pt last BP 147/80. Pt already has appt scheduled to see Dr Diona Browner on 07/02/21 at 11 AM. Sending note to Dr Diona Browner and Butch Penny CMA.

## 2021-07-02 ENCOUNTER — Ambulatory Visit (INDEPENDENT_AMBULATORY_CARE_PROVIDER_SITE_OTHER): Payer: Medicare Other | Admitting: Family Medicine

## 2021-07-02 ENCOUNTER — Other Ambulatory Visit: Payer: Self-pay

## 2021-07-02 VITALS — BP 140/98 | HR 100 | Temp 97.8°F | Ht 63.5 in | Wt 101.5 lb

## 2021-07-02 DIAGNOSIS — I10 Essential (primary) hypertension: Secondary | ICD-10-CM

## 2021-07-02 NOTE — Patient Instructions (Addendum)
Increase losartan to 50 mg daily ( take 2 tabs of 25 mg daily).  Goal BP < 150/90. Too low BP is <90/60.

## 2021-07-02 NOTE — Telephone Encounter (Signed)
Call Have pt increase losartan to 50 mg daily and we will follow up as scheduled.. change on med list please. Can send in new rx if needed for higher dose.

## 2021-07-02 NOTE — Telephone Encounter (Signed)
[name] notified as instructed by telephone.  Patient didn't thinks she was even taking Losartan.  She states she is scheduled today to see Dr. Diona Browner at 11:00 am.  I advised to keep appointment today as scheduled and this can be addressed at that time.  FYI to Dr. Diona Browner.

## 2021-07-02 NOTE — Progress Notes (Signed)
Patient ID: [name], female    DOB: [birth year], 87 y.o.   MRN: [id number]  This visit was conducted in person.  BP (!) 140/98   Pulse 100   Temp 97.8 F (36.6 C) (Temporal)   Ht 5' 3.5" (1.613 m)   Wt 101 lb 8 oz (46 kg)   SpO2 96%   BMI 17.70 kg/m    CC: Chief Complaint  Patient presents with   Hypertension    Higher over last week     Subjective:   HPI: [name] is a 87 y.o. female presenting on 07/02/2021 for Hypertension (Higher over last week )   Reviewed OV from last week. Pt had been off BP meds and restarted them after ER visit 8/10  At last visit here dizziness had resolved  but BP was not controlled.  Diltiazem was continued and losartan 25 mg daily added back   Chlorthalidone still held given low Na.  Today she reports she is  feeling tired    No further dizziness, occ dull headache. BP Readings from Last 3 Encounters:  07/02/21 (!) 140/98  06/25/21 (!) 146/82  06/17/21 (!) 163/81        Relevant past medical, surgical, family and social history reviewed and updated as indicated. Interim medical history since our last visit reviewed. Allergies and medications reviewed and updated. Outpatient Medications Prior to Visit  Medication Sig Dispense Refill   aspirin EC 81 MG EC tablet Take 1 tablet (81 mg total) by mouth daily. Swallow whole. 30 tablet 0   Cholecalciferol (VITAMIN D PO) Take 5,000 Units by mouth daily.     cloNIDine (CATAPRES) 0.1 MG tablet Take 1 tablet (0.1 mg total) by mouth daily as needed (SBP > 150). 30 tablet 0   colestipol (COLESTID) 1 G tablet Take 2 g by mouth 3 (three) times daily.      diltiazem (DILACOR XR) 240 MG 24 hr capsule Take 1 capsule (240 mg total) by mouth daily. 90 capsule 3   losartan (COZAAR) 25 MG tablet Take 1 tablet (25 mg total) by mouth daily. 30 tablet 11   meclizine (ANTIVERT) 25 MG tablet Take 1 tablet (25 mg total) by mouth 3 (three) times daily as needed for dizziness. 30 tablet 0   Opium 10  MG/ML (1%) TINC Please take 0.2-0.4 mL three times per day     No facility-administered medications prior to visit.     Per HPI unless specifically indicated in ROS section below Review of Systems  Constitutional:  Negative for fatigue and fever.  HENT:  Negative for ear pain.   Eyes:  Negative for pain.  Respiratory:  Negative for chest tightness and shortness of breath.   Cardiovascular:  Negative for chest pain, palpitations and leg swelling.  Gastrointestinal:  Negative for abdominal pain.  Genitourinary:  Negative for dysuria.  Neurological:  Positive for dizziness.  Objective:  BP (!) 140/98   Pulse 100   Temp 97.8 F (36.6 C) (Temporal)   Ht 5' 3.5" (1.613 m)   Wt 101 lb 8 oz (46 kg)   SpO2 96%   BMI 17.70 kg/m   Wt Readings from Last 3 Encounters:  07/02/21 101 lb 8 oz (46 kg)  06/25/21 102 lb (46.3 kg)  06/13/21 100 lb (45.4 kg)      Physical Exam Constitutional:      General: She is not in acute distress.    Appearance: Normal appearance. She is well-developed. She is  not ill-appearing or toxic-appearing.  HENT:     Head: Normocephalic.     Right Ear: Hearing, tympanic membrane, ear canal and external ear normal. Tympanic membrane is not erythematous, retracted or bulging.     Left Ear: Hearing, tympanic membrane, ear canal and external ear normal. Tympanic membrane is not erythematous, retracted or bulging.     Nose: No mucosal edema or rhinorrhea.     Right Sinus: No maxillary sinus tenderness or frontal sinus tenderness.     Left Sinus: No maxillary sinus tenderness or frontal sinus tenderness.     Mouth/Throat:     Pharynx: Uvula midline.  Eyes:     General: Lids are normal. Lids are everted, no foreign bodies appreciated.     Conjunctiva/sclera: Conjunctivae normal.     Pupils: Pupils are equal, round, and reactive to light.  Neck:     Thyroid: No thyroid mass or thyromegaly.     Vascular: No carotid bruit.     Trachea: Trachea normal.   Cardiovascular:     Rate and Rhythm: Normal rate and regular rhythm.     Pulses: Normal pulses.     Heart sounds: Normal heart sounds, S1 normal and S2 normal. No murmur heard.   No friction rub. No gallop.  Pulmonary:     Effort: Pulmonary effort is normal. No tachypnea or respiratory distress.     Breath sounds: Normal breath sounds. No decreased breath sounds, wheezing, rhonchi or rales.  Abdominal:     General: Bowel sounds are normal.     Palpations: Abdomen is soft.     Tenderness: There is no abdominal tenderness.  Musculoskeletal:     Cervical back: Normal range of motion and neck supple.  Skin:    General: Skin is warm and dry.     Findings: No rash.  Neurological:     Mental Status: She is alert.  Psychiatric:        Mood and Affect: Mood is not anxious or depressed.        Speech: Speech normal.        Behavior: Behavior normal. Behavior is cooperative.        Thought Content: Thought content normal.        Judgment: Judgment normal.      Results for orders placed or performed during the hospital encounter of 06/17/21  CBC with Differential  Result Value Ref Range   WBC 6.3 4.0 - 10.5 K/uL   RBC 4.98 3.87 - 5.11 MIL/uL   Hemoglobin 14.8 12.0 - 15.0 g/dL   HCT 46.4 (H) 36.0 - 46.0 %   MCV 93.2 80.0 - 100.0 fL   MCH 29.7 26.0 - 34.0 pg   MCHC 31.9 30.0 - 36.0 g/dL   RDW 14.5 11.5 - 15.5 %   Platelets 346 150 - 400 K/uL   nRBC 0.0 0.0 - 0.2 %   Neutrophils Relative % 65 %   Neutro Abs 4.1 1.7 - 7.7 K/uL   Lymphocytes Relative 21 %   Lymphs Abs 1.3 0.7 - 4.0 K/uL   Monocytes Relative 11 %   Monocytes Absolute 0.7 0.1 - 1.0 K/uL   Eosinophils Relative 2 %   Eosinophils Absolute 0.1 0.0 - 0.5 K/uL   Basophils Relative 1 %   Basophils Absolute 0.0 0.0 - 0.1 K/uL   Immature Granulocytes 0 %   Abs Immature Granulocytes 0.02 0.00 - 0.07 K/uL  Comprehensive metabolic panel  Result Value Ref Range   Sodium 134 (L)  135 - 145 mmol/L   Potassium 4.0 3.5 - 5.1  mmol/L   Chloride 97 (L) 98 - 111 mmol/L   CO2 30 22 - 32 mmol/L   Glucose, Bld 114 (H) 70 - 99 mg/dL   BUN 32 (H) 8 - 23 mg/dL   Creatinine, Ser 0.76 0.44 - 1.00 mg/dL   Calcium 10.0 8.9 - 10.3 mg/dL   Total Protein 6.5 6.5 - 8.1 g/dL   Albumin 3.8 3.5 - 5.0 g/dL   AST 15 15 - 41 U/L   ALT 19 0 - 44 U/L   Alkaline Phosphatase 58 38 - 126 U/L   Total Bilirubin 0.9 0.3 - 1.2 mg/dL   GFR, Estimated >60 >60 mL/min   Anion gap 7 5 - 15    This visit occurred during the SARS-CoV-2 public health emergency.  Safety protocols were in place, including screening questions prior to the visit, additional usage of staff PPE, and extensive cleaning of exam room while observing appropriate contact time as indicated for disinfecting solutions.   COVID 19 screen:  No recent travel or known exposure to COVID19 The patient denies respiratory symptoms of COVID 19 at this time. The importance of social distancing was discussed today.   Assessment and Plan Problem List Items Addressed This Visit     Essential hypertension, benign - Primary    Chronic , continued inadequate control Increase losartan to 50 mg daily ( take 2 tabs of 25 mg daily).  Goal BP < 150/90.          Eliezer Lofts, MD

## 2021-07-09 ENCOUNTER — Ambulatory Visit (INDEPENDENT_AMBULATORY_CARE_PROVIDER_SITE_OTHER): Payer: Medicare Other | Admitting: Family Medicine

## 2021-07-09 ENCOUNTER — Other Ambulatory Visit: Payer: Self-pay

## 2021-07-09 ENCOUNTER — Encounter: Payer: Self-pay | Admitting: Family Medicine

## 2021-07-09 VITALS — BP 120/70 | HR 78 | Temp 97.8°F | Ht 63.5 in | Wt 102.2 lb

## 2021-07-09 DIAGNOSIS — I10 Essential (primary) hypertension: Secondary | ICD-10-CM | POA: Diagnosis not present

## 2021-07-09 DIAGNOSIS — Z23 Encounter for immunization: Secondary | ICD-10-CM

## 2021-07-09 LAB — BASIC METABOLIC PANEL
BUN: 21 mg/dL (ref 6–23)
CO2: 31 mEq/L (ref 19–32)
Calcium: 10.1 mg/dL (ref 8.4–10.5)
Chloride: 102 mEq/L (ref 96–112)
Creatinine, Ser: 0.65 mg/dL (ref 0.40–1.20)
GFR: 79.81 mL/min (ref >60.00)
Glucose, Bld: 94 mg/dL (ref 70–99)
Potassium: 4.1 mEq/L (ref 3.5–5.1)
Sodium: 139 mEq/L (ref 135–145)

## 2021-07-09 NOTE — Progress Notes (Signed)
[id number]  

## 2021-07-09 NOTE — Assessment & Plan Note (Signed)
Chronic, well controlled   Continue losartan 50 mg daily and diltiazem XR 240 mg daily

## 2021-07-09 NOTE — Patient Instructions (Addendum)
Please stop at the lab to have labs drawn.  Continue current medications.

## 2021-07-09 NOTE — Progress Notes (Signed)
Patient ID: [name], female    DOB: 07-07-1933, 87 y.o.   MRN: [id number]  This visit was conducted in person.  BP 120/70   Pulse 78   Temp 97.8 F (36.6 C) (Temporal)   Ht 5' 3.5" (1.613 m)   Wt 102 lb 4 oz (46.4 kg)   SpO2 100%   BMI 17.83 kg/m    CC: Chief Complaint  Patient presents with   Follow-up    HTN    Subjective:   HPI: [name] is a 87 y.o. female presenting on 07/09/2021 for Follow-up (HTN)  Hypertension:   Improved control of BP on losartan 50 mg daily.  BP Readings from Last 3 Encounters:  07/09/21 120/70  07/02/21 (!) 140/98  06/25/21 (!) 146/82  Using medication without problems or lightheadedness:  none Chest pain with exertion: none Edema:none Short of breath: none Average home BPs: 107-81, 165/88 Other issues:       Relevant past medical, surgical, family and social history reviewed and updated as indicated. Interim medical history since our last visit reviewed. Allergies and medications reviewed and updated. Outpatient Medications Prior to Visit  Medication Sig Dispense Refill   aspirin EC 81 MG EC tablet Take 1 tablet (81 mg total) by mouth daily. Swallow whole. 30 tablet 0   Cholecalciferol (VITAMIN D PO) Take 5,000 Units by mouth daily.     cloNIDine (CATAPRES) 0.1 MG tablet Take 1 tablet (0.1 mg total) by mouth daily as needed (SBP > 150). 30 tablet 0   colestipol (COLESTID) 1 G tablet Take 2 g by mouth 3 (three) times daily.      diltiazem (DILACOR XR) 240 MG 24 hr capsule Take 1 capsule (240 mg total) by mouth daily. 90 capsule 3   losartan (COZAAR) 25 MG tablet Take 1 tablet (25 mg total) by mouth daily. (Patient taking differently: Take 50 mg by mouth daily.) 30 tablet 11   Opium 10 MG/ML (1%) TINC Please take 0.2-0.4 mL three times per day     meclizine (ANTIVERT) 25 MG tablet Take 1 tablet (25 mg total) by mouth 3 (three) times daily as needed for dizziness. 30 tablet 0   No facility-administered medications prior to  visit.     Per HPI unless specifically indicated in ROS section below Review of Systems  Constitutional:  Negative for fatigue and fever.  HENT:  Negative for ear pain.   Eyes:  Negative for pain.  Respiratory:  Negative for chest tightness and shortness of breath.   Cardiovascular:  Negative for chest pain, palpitations and leg swelling.  Gastrointestinal:  Negative for abdominal pain.  Genitourinary:  Negative for dysuria.  Objective:  BP 120/70   Pulse 78   Temp 97.8 F (36.6 C) (Temporal)   Ht 5' 3.5" (1.613 m)   Wt 102 lb 4 oz (46.4 kg)   SpO2 100%   BMI 17.83 kg/m   Wt Readings from Last 3 Encounters:  07/09/21 102 lb 4 oz (46.4 kg)  07/02/21 101 lb 8 oz (46 kg)  06/25/21 102 lb (46.3 kg)      Physical Exam Constitutional:      General: She is not in acute distress.    Appearance: Normal appearance. She is well-developed. She is not ill-appearing or toxic-appearing.  HENT:     Head: Normocephalic.     Right Ear: Hearing, tympanic membrane, ear canal and external ear normal. Tympanic membrane is not erythematous, retracted or bulging.  Left Ear: Hearing, tympanic membrane, ear canal and external ear normal. Tympanic membrane is not erythematous, retracted or bulging.     Nose: No mucosal edema or rhinorrhea.     Right Sinus: No maxillary sinus tenderness or frontal sinus tenderness.     Left Sinus: No maxillary sinus tenderness or frontal sinus tenderness.     Mouth/Throat:     Pharynx: Uvula midline.  Eyes:     General: Lids are normal. Lids are everted, no foreign bodies appreciated.     Conjunctiva/sclera: Conjunctivae normal.     Pupils: Pupils are equal, round, and reactive to light.  Neck:     Thyroid: No thyroid mass or thyromegaly.     Vascular: No carotid bruit.     Trachea: Trachea normal.  Cardiovascular:     Rate and Rhythm: Normal rate and regular rhythm.     Pulses: Normal pulses.     Heart sounds: Normal heart sounds, S1 normal and S2 normal.  No murmur heard.   No friction rub. No gallop.  Pulmonary:     Effort: Pulmonary effort is normal. No tachypnea or respiratory distress.     Breath sounds: Normal breath sounds. No decreased breath sounds, wheezing, rhonchi or rales.  Abdominal:     General: Bowel sounds are normal.     Palpations: Abdomen is soft.     Tenderness: There is no abdominal tenderness.  Musculoskeletal:     Cervical back: Normal range of motion and neck supple.  Skin:    General: Skin is warm and dry.     Findings: No rash.  Neurological:     Mental Status: She is alert.  Psychiatric:        Mood and Affect: Mood is not anxious or depressed.        Speech: Speech normal.        Behavior: Behavior normal. Behavior is cooperative.        Thought Content: Thought content normal.        Judgment: Judgment normal.      Results for orders placed or performed during the hospital encounter of 06/17/21  CBC with Differential  Result Value Ref Range   WBC 6.3 4.0 - 10.5 K/uL   RBC 4.98 3.87 - 5.11 MIL/uL   Hemoglobin 14.8 12.0 - 15.0 g/dL   HCT 46.4 (H) 36.0 - 46.0 %   MCV 93.2 80.0 - 100.0 fL   MCH 29.7 26.0 - 34.0 pg   MCHC 31.9 30.0 - 36.0 g/dL   RDW 14.5 11.5 - 15.5 %   Platelets 346 150 - 400 K/uL   nRBC 0.0 0.0 - 0.2 %   Neutrophils Relative % 65 %   Neutro Abs 4.1 1.7 - 7.7 K/uL   Lymphocytes Relative 21 %   Lymphs Abs 1.3 0.7 - 4.0 K/uL   Monocytes Relative 11 %   Monocytes Absolute 0.7 0.1 - 1.0 K/uL   Eosinophils Relative 2 %   Eosinophils Absolute 0.1 0.0 - 0.5 K/uL   Basophils Relative 1 %   Basophils Absolute 0.0 0.0 - 0.1 K/uL   Immature Granulocytes 0 %   Abs Immature Granulocytes 0.02 0.00 - 0.07 K/uL  Comprehensive metabolic panel  Result Value Ref Range   Sodium 134 (L) 135 - 145 mmol/L   Potassium 4.0 3.5 - 5.1 mmol/L   Chloride 97 (L) 98 - 111 mmol/L   CO2 30 22 - 32 mmol/L   Glucose, Bld 114 (H) 70 - 99 mg/dL  BUN 32 (H) 8 - 23 mg/dL   Creatinine, Ser 0.76 0.44 - 1.00  mg/dL   Calcium 10.0 8.9 - 10.3 mg/dL   Total Protein 6.5 6.5 - 8.1 g/dL   Albumin 3.8 3.5 - 5.0 g/dL   AST 15 15 - 41 U/L   ALT 19 0 - 44 U/L   Alkaline Phosphatase 58 38 - 126 U/L   Total Bilirubin 0.9 0.3 - 1.2 mg/dL   GFR, Estimated >60 >60 mL/min   Anion gap 7 5 - 15    This visit occurred during the SARS-CoV-2 public health emergency.  Safety protocols were in place, including screening questions prior to the visit, additional usage of staff PPE, and extensive cleaning of exam room while observing appropriate contact time as indicated for disinfecting solutions.   COVID 19 screen:  No recent travel or known exposure to COVID19 The patient denies respiratory symptoms of COVID 19 at this time. The importance of social distancing was discussed today.   Assessment and Plan  Problem List Items Addressed This Visit     Essential hypertension, benign - Primary    Chronic, well controlled   Continue losartan 50 mg daily and diltiazem XR 240 mg daily      Relevant Orders   Basic Metabolic Panel   Other Visit Diagnoses     Need for influenza vaccination       Relevant Orders   Flu Vaccine QUAD 6+ mos PF IM (Fluarix Quad PF) (Completed)      Check BMET today on increased dose losartan.  Eliezer Lofts, MD

## 2021-07-13 ENCOUNTER — Telehealth: Payer: Self-pay | Admitting: *Deleted

## 2021-07-13 NOTE — Telephone Encounter (Signed)
Mr. Osterkamp notified as instructed by telephone.  He states Mrs. Holster takes her medication around the same time every day.  He gives her the diltiazem in the morning and the losartan at night.  He did give her a clonidine as instructed at the hospital if >150 and he states that brought her BP down.  Will continue to follow and call us if BP continues to be elevated, for an appointment.  They are scheduled to see Dr. Melrose Nakayama, neurologist in October.

## 2021-07-13 NOTE — Telephone Encounter (Signed)
Spoke to patient by telephone and was advised that her blood pressure got up to 187/108 this morning. Patient denies that she had any symptoms. Patient stated that she had not taken her morning medication yet. Patient stated that she  took the Clonidine and her blood pressure came down. Patient's husband checked his wife's blood pressure while on the phone and it was 121/80. Patient's husband was advised to continue to monitor her blood pressure and let Dr. Diona Browner know if her blood pressure continues to be elevated. . Patient's husband was given ER precautions and he verbalized understanding.  Patient's husband stated that he is wondering if his wife may have thyroid problems that is causing her blood pressure to get this high. Patient's husband stated that a lot of his wife's family has thyroid problems.

## 2021-07-13 NOTE — Telephone Encounter (Signed)
PLEASE NOTE: All timestamps contained within this report are represented as [place] Standard Time. CONFIDENTIALTY NOTICE: This fax transmission is intended only for the addressee. It contains information that is legally privileged, confidential or otherwise protected from use or disclosure. If you are not the intended recipient, you are strictly prohibited from reviewing, disclosing, copying using or disseminating any of this information or taking any action in reliance on or regarding this information. If you have received this fax in error, please notify us immediately by telephone so that we can arrange for its return to [place]. Phone: [phone], Toll-Free: [phone], Fax: [phone] Page: 1 of 2 Call Id: [id number] [facility] - Client TELEPHONE ADVICE RECORD [facility] Patient Name: [name] Gender: Female DOB: 1933/05/06 Age: 87 Y 3 M 9 D Return Phone Number: [phone] (Primary), [phone] (Secondary) Address: City/ State/ Zip: [place]  [address] Client [facility] - Client Client Site [place] - Day Physician Eliezer Lofts - MD Contact Type Call Who Is Calling Patient / Member / Family / Caregiver Call Type Triage / Clinical Relationship To Patient Self Return Phone Number [phone] (Primary) Chief Complaint Blood Pressure High Reason for Call Symptomatic / Request for [place] states her blood pressure is 176/105, no current symptoms. Translation No Nurse Assessment Nurse: Verita Schneiders, RN, April Date/Time (Eastern Time): 07/13/2021 8:31:44 AM Confirm and document reason for call. If symptomatic, describe symptoms. ---Caller states her blood pressure is 176/105 (taken 10 minutes ago), no current symptoms. Does the patient have any new or worsening symptoms? ---Yes Will a triage be completed? ---Yes Related visit to physician within the last  2 weeks? ---Yes Does the PT have any chronic conditions? (i.e. diabetes, asthma, this includes High risk factors for pregnancy, etc.) ---Yes List chronic conditions. ---hypertension Is this a behavioral health or substance abuse call? ---No Nurse: Verita Schneiders, RN, April Date/Time (Eastern Time): 07/13/2021 8:55:26 AM Confirm and document reason for call. If symptomatic, describe symptoms. ---Experienced computer system errors. - States that wife was given prescription for Clonidine for hypertensive episodes. They mentioned concerns about thyroid disorders because of a family history and would like that to be double checked. Does the patient have any new or worsening symptoms? ---Yes Will a triage be completed? ---Yes Related visit to physician within the last 2 weeks? ---Yes Does the PT have any chronic conditions? (i.e. diabetes, asthma, this includes High risk factors for pregnancy, etc.) ---Yes PLEASE NOTE: All timestamps contained within this report are represented as [place] Standard Time. CONFIDENTIALTY NOTICE: This fax transmission is intended only for the addressee. It contains information that is legally privileged, confidential or otherwise protected from use or disclosure. If you are not the intended recipient, you are strictly prohibited from reviewing, disclosing, copying using or disseminating any of this information or taking any action in reliance on or regarding this information. If you have received this fax in error, please notify us immediately by telephone so that we can arrange for its return to [place]. Phone: [phone], Toll-Free: [phone], Fax: [phone] Page: 2 of 2 Call Id: [id number] Nurse Assessment List chronic conditions. ---hypertension States she takes Diltizem and Losartan but she claims to only have HTN. Is this a behavioral health or substance abuse call? ---No Guidelines Guideline Title Affirmed Question Affirmed Notes Nurse Date/Time  (Eastern Time) Blood Pressure - High Systolic BP >= 0000000 OR Diastolic >= 123XX123 Beams, RN, April 07/13/2021 8:57:53 AM Disp. Time [name]  Time) Disposition Final User 07/13/2021 8:46:02 AM Send To RN Personal Beams, RN, April 07/13/2021 8:58:57 AM SEE PCP WITHIN 3 DAYS Yes Beams, RN, April Caller Disagree/Comply Comply Caller Understands Yes PreDisposition Call Doctor Care Advice Given Per Guideline SEE PCP WITHIN 3 DAYS: * You need to be seen within 2 or 3 days. HIGH BLOOD PRESSURE: CALL BACK IF: * You become worse CARE ADVICE given per High Blood Pressure (Adult) guideline. Comments User: April, Beams, RN Date/Time [name] Time): 07/13/2021 8:59:31 AM Instructed them to call office where she was last seen (this location is closed) so she can be re-evaluated. States understanding. Referrals REFERRED TO PCP OFFICE

## 2021-07-13 NOTE — Telephone Encounter (Signed)
Have her make sure she is taking the medication every day same time. She can also split her meds if not already doing it.. ie take losartan in AM ( or PM) and diltiazem PM (or AM).  Also please add back the losartan 2 tabs of 25 mg daily to her med list.. not sure why this fell off at last OV. If BP remaining up, have her make an appt. .. agree with thyroid check. Thyroid has not been checked in > 1 year.

## 2021-07-14 ENCOUNTER — Telehealth: Payer: Self-pay | Admitting: Family Medicine

## 2021-07-14 DIAGNOSIS — Z1329 Encounter for screening for other suspected endocrine disorder: Secondary | ICD-10-CM

## 2021-07-14 NOTE — Telephone Encounter (Signed)
-----   Message from Ellamae Sia sent at 07/14/2021 11:35 AM EDT ----- Regarding: Lab orders for Wednesday, 9.21.22 Lab orders for thyroid labs?

## 2021-07-15 ENCOUNTER — Other Ambulatory Visit (INDEPENDENT_AMBULATORY_CARE_PROVIDER_SITE_OTHER): Payer: Medicare Other

## 2021-07-15 ENCOUNTER — Other Ambulatory Visit: Payer: Self-pay | Admitting: *Deleted

## 2021-07-15 ENCOUNTER — Other Ambulatory Visit: Payer: Medicare Other

## 2021-07-15 ENCOUNTER — Other Ambulatory Visit: Payer: Self-pay

## 2021-07-15 DIAGNOSIS — Z1329 Encounter for screening for other suspected endocrine disorder: Secondary | ICD-10-CM | POA: Diagnosis not present

## 2021-07-15 LAB — TSH: TSH: 1.97 u[IU]/mL (ref 0.35–5.50)

## 2021-07-15 MED ORDER — LOSARTAN POTASSIUM 25 MG PO TABS
50.0000 mg | ORAL_TABLET | Freq: Every day | ORAL | 1 refills | Status: DC
Start: 2021-07-15 — End: 2021-07-31

## 2021-07-17 ENCOUNTER — Ambulatory Visit: Payer: Medicare Other | Admitting: Family Medicine

## 2021-07-28 ENCOUNTER — Telehealth: Payer: Self-pay | Admitting: Family Medicine

## 2021-07-28 NOTE — Telephone Encounter (Signed)
Pt called stated she has medication concern wants to know if she should continue tacking Clonidine med. That was given  to her in the hospital . Please Advise [phone]

## 2021-07-29 NOTE — Assessment & Plan Note (Signed)
Chronic , continued inadequate control Increase losartan to 50 mg daily ( take 2 tabs of 25 mg daily).  Goal BP < 150/90.

## 2021-07-29 NOTE — Telephone Encounter (Signed)
Called patient to get more information. Patient states Clonidine was prescribed when patient was at the ER for her b/p. Patient would like to know if it safe to take this medication every day? Or any other suggestions for this? She is having to take it pretty much daily because her b/p is never ok and it goes up daily. Before medication her b/p runs about 150-200/100s and she takes Clonidine and within a few minutes or so it goes down to like 140/80 or so.   Patient is still taking Losartan 25 mg 2 tablets daily also and Diltiazem 240 mg daily.

## 2021-07-31 MED ORDER — LOSARTAN POTASSIUM 100 MG PO TABS: 100.0000 mg | ORAL_TABLET | Freq: Every day | ORAL | 11 refills | Status: DC

## 2021-07-31 NOTE — Telephone Encounter (Signed)
Clonidine is okay to take daily but not ideal as it can cause rebound hypertension if not taken at exactly same time each dsy.  Recomendations  Stop clonidine.  We will increase losartan to 100 mg daily. Send in rx for losartan 100 mg daily  #30, 11 RF.  Continue current dose of diltiazem.  Call with BP measurements in 1 week.

## 2021-07-31 NOTE — Telephone Encounter (Signed)
Patient advised. RX sent in for Losartan 100 mg. Patient verbalized understanding to call us next week with b/p readings

## 2021-08-12 NOTE — Telephone Encounter (Signed)
Pt called in with BP readings for provider

## 2021-09-08 ENCOUNTER — Telehealth: Payer: Self-pay | Admitting: Family Medicine

## 2021-09-08 NOTE — Chronic Care Management (AMB) (Signed)
  Chronic Care Management   Note  09/08/2021 Name: [name] MRN: [id number] DOB: 06-19-1933  [name] is a 87 y.o. year old female who is a primary care patient of Bedsole, Amy E, MD. I reached out to [employer] by phone today in response to a referral sent by [name]'s PCP, Jinny Sanders, MD.   [name] was given information about Chronic Care Management services today including:  CCM service includes personalized support from designated clinical staff supervised by her physician, including individualized plan of care and coordination with other care providers 24/7 contact phone numbers for assistance for urgent and routine care needs. Service will only be billed when office clinical staff spend 20 minutes or more in a month to coordinate care. Only one practitioner may furnish and bill the service in a calendar month. The patient may stop CCM services at any time (effective at the end of the month) by phone call to the office staff.   Patient agreed to services and verbal consent obtained.   Follow up plan:   [name]

## 2021-09-30 ENCOUNTER — Telehealth: Payer: Self-pay

## 2021-09-30 NOTE — Chronic Care Management (AMB) (Signed)
Chronic Care Management Pharmacy Assistant   Name: [name]  MRN: [id number] DOB: 1933-05-16  [name] is an 87 y.o. year old female who presents for her initial CCM visit with the clinical pharmacist.  Reason for Encounter: Initial Questions   Conditions to be addressed/monitored: HTN and HLD   Recent office visits:  07/09/21-PCP-Amy Bedsole,MD-Patient presented for follow up Hypertension.Flu vaccine given Labs ordered( stable and in normal range)no medication changes  07/02/21-PCP-Amy Bedsole,MD-Patient presented for fatigue, increase BP.Increase losartan to 50 mg daily ( take 2 tabs of 25 mg daily).Goal BP < 150/90. 06/25/21-PCP-Amy Bedsole,MD-Patient presented for hospital follow up.Reviewed labs, imaging, referral to Neurology. Start losartan 25mg  take 1 tablet at bedtime.  Recent consult visits:  05/15/21-No data found  Hospital visits:  06/17/21-[facility]-Kevin Steinl,MD-Patient presented for dizziness and lightheadedness.Labs ordered(abnormal) IV fluids,reviewed prior imaging, patient stable for discharge -No admission  06/13/21 thru 06/16/21-[facility]- Prashant Singh,MD-Patient presented for dizzinessPrevious labs and imaging reviewed.Meclizine given,We will start patient on diazepam if symptoms do not resolve with meclizine,neurology consult, admission 06/03/21-[facility] ED-Cameron Issacs,MD-Patient presented for hypertension as a result of home BP 200/120.CT-scan ordered,labs ordered (all reassuring) BP improving so patient is discharged to home.  Medications: Outpatient Encounter Medications as of 09/30/2021  Medication Sig   aspirin EC 81 MG EC tablet Take 1 tablet (81 mg total) by mouth daily. Swallow whole.   Cholecalciferol (VITAMIN D PO) Take 5,000 Units by mouth daily.   cloNIDine (CATAPRES) 0.1 MG tablet Take 1 tablet (0.1 mg total) by mouth daily as needed (SBP > 150).   colestipol (COLESTID) 1 G tablet Take 2 g by mouth 3 (three) times daily.     diltiazem (DILACOR XR) 240 MG 24 hr capsule Take 1 capsule (240 mg total) by mouth daily.   losartan (COZAAR) 100 MG tablet Take 1 tablet (100 mg total) by mouth daily.   Opium 10 MG/ML (1%) TINC Please take 0.2-0.4 mL three times per day   No facility-administered encounter medications on file as of 09/30/2021.    Lab Results  Component Value Date/Time   HGBA1C 5.2 06/13/2021 02:29 PM     BP Readings from Last 3 Encounters:  07/09/21 120/70  07/02/21 (!) 140/98  06/25/21 (!) 146/82    Patient contacted to review initial questions prior to visit with Charlene Brooke.  Have you seen any other providers since your last visit with PCP? No  Any changes in your medications or health? No  Any side effects from any medications? No  Do you have an symptoms or problems not managed by your medications? No  Any concerns about your health right now? No  Has your provider asked that you check blood pressure, blood sugar, or follow special diet at home? Yes The patient does have a BP monitor.  Do you get any type of exercise on a regular basis? Yes The patient reports she stays active for her age and cleans her own 2 story house regularly   Can you think of a goal you would like to reach for your health? No  Do you have any problems getting your medications? No   Is there anything that you would like to discuss during the appointment? No   Spoke with patient and reminded them to have all medications, supplements and any blood glucose and blood pressure readings available for review with pharmacist, at their telephone visit on 10/06/21 at 9:00am.   Star Rating Drugs:  Medication:  Last Fill:  Day Supply Losartan 100mg  09/25/21 30   Care Gaps: Annual wellness visit in last year? No Most Recent BP reading:120/70  78-P  Marjo Bicker, CPP notified  Avel Sensor, [place] Assistant [phone]  Total time spent for month CPA: 40 min.

## 2021-10-06 ENCOUNTER — Ambulatory Visit: Payer: Medicare Other | Admitting: Pharmacist

## 2021-10-06 ENCOUNTER — Other Ambulatory Visit: Payer: Self-pay

## 2021-10-06 DIAGNOSIS — I10 Essential (primary) hypertension: Secondary | ICD-10-CM

## 2021-10-06 DIAGNOSIS — K529 Noninfective gastroenteritis and colitis, unspecified: Secondary | ICD-10-CM

## 2021-10-06 DIAGNOSIS — E782 Mixed hyperlipidemia: Secondary | ICD-10-CM

## 2021-10-06 NOTE — Patient Instructions (Signed)
Visit Information  Phone number for Pharmacist: [phone]  Thank you for meeting with me to discuss your medications! I look forward to working with you to achieve your health care goals. Below is a summary of what we talked about during the visit:   Goals Addressed             This Visit's Progress    Track and Manage My Blood Pressure-Hypertension       Timeframe:  Long-Range Goal Priority:  Medium Start Date:      10/06/21                       Expected End Date:    10/06/22                   Follow Up Date Dec 2023  - check blood pressure 3 times per week - choose a place to take my blood pressure (home, clinic or office, retail store) - write blood pressure results in a log or diary    Why is this important?   You won't feel high blood pressure, but it can still hurt your blood vessels.  High blood pressure can cause heart or kidney problems. It can also cause a stroke.  Making lifestyle changes like losing a little weight or eating less salt will help.  Checking your blood pressure at home and at different times of the day can help to control blood pressure.  If the doctor prescribes medicine remember to take it the way the doctor ordered.  Call the office if you cannot afford the medicine or if there are questions about it.     Notes:         Care Plan : [place]  Updates made by Charlton Haws, RPH since 10/06/2021 12:00 AM     Problem: Hypertension and Hyperlipidemia, Chronic diarrhea   Priority: High     Long-Range Goal: Disease mgmt   Start Date: 10/06/2021  Expected End Date: 10/06/2022  This Visit's Progress: On track  Priority: High  Note:   Current Barriers:  Unable to independently monitor therapeutic efficacy  Pharmacist Clinical Goal(s):  Patient will achieve adherence to monitoring guidelines and medication adherence to achieve therapeutic efficacy through collaboration with PharmD and provider.   Interventions: 1:1  collaboration with Jinny Sanders, MD regarding development and update of comprehensive plan of care as evidenced by provider attestation and co-signature Inter-disciplinary care team collaboration (see longitudinal plan of care) Comprehensive medication review performed; medication list updated in electronic medical record  Hypertension (BP goal <140/90) -Controlled - pt reports her husband checks her BP regularly and is "very happy" with readings; she does not have any readings available today; she denies dizziness -Current home readings: not available during call -Current treatment: Clonidine 0.1 mg PRN SBP > 150 - has not used in months Diltiazem XR 240 mg daily Losartan 100 mg daily -Denies hypotensive/hypertensive symptoms -Educated on BP goals and benefits of medications for prevention of heart attack, stroke and kidney damage; Importance of home blood pressure monitoring; -Counseled to monitor BP at home periodically -Recommended to continue current medication  Hyperlipidemia: (LDL goal < 100) -Controlled - LDL is at goal without statin; pt takes colestipol for GI issues but it also helps with LDL lowering; she endorses adherence with aspirin and denies bleeding -Hx chronic cerebellar infarcts on MRI 05/2021 -Current treatment: Colestipol 1 g BID Aspirin 81 mg daily -Educated on Cholesterol goals;  benefits of aspirin for stroke prevention -Recommended to continue current medication  Chronic diarrhea (Goal: manage symptoms) -Controlled - pt reports symptoms are controlled with current treatment -Hx rectal cancer; small intestine surgery leading to chronic diarrhea; follows with GI annually -Current treatment  Opium 1% tincture 0.5 mL TID Colestipol 2 g BID -Recommended to continue current medication  Health Maintenance -Vaccine gaps: Shingrix, Covid booster -Declined DEXA previously; at risk for osteoporosis -Current therapy:  Vitamin D 5000 IU daily -Patient is satisfied  with current therapy and denies issues -Recommended to continue current medication  Patient Goals/Self-Care Activities Patient will:  - take medications as prescribed as evidenced by patient report and record review focus on medication adherence by routine check blood pressure perodically, document, and provide at future appointments      Ms. Branaman was given information about Chronic Care Management services today including:  CCM service includes personalized support from designated clinical staff supervised by her physician, including individualized plan of care and coordination with other care providers 24/7 contact phone numbers for assistance for urgent and routine care needs. Standard insurance, coinsurance, copays and deductibles apply for chronic care management only during months in which we provide at least 20 minutes of these services. Most insurances cover these services at 100%, however patients may be responsible for any copay, coinsurance and/or deductible if applicable. This service may help you avoid the need for more expensive face-to-face services. Only one practitioner may furnish and bill the service in a calendar month. The patient may stop CCM services at any time (effective at the end of the month) by phone call to the office staff.  Patient agreed to services and verbal consent obtained.   The patient verbalized understanding of instructions, educational materials, and care plan provided today and declined offer to receive copy of patient instructions, educational materials, and care plan.  Telephone follow up appointment with pharmacy team member scheduled for: 1 year  Charlene Brooke, PharmD, [facility] Clinical Pharmacist [facility] [phone]

## 2021-10-06 NOTE — Progress Notes (Signed)
Chronic Care Management Pharmacy Note  10/06/2021 Name:  [name] MRN:  [id number] DOB:  [birth year]  Summary: -Pt endorses compliance with medications as prescribed; her husband organizes and administers them to her daily -Pt's husband checks her BP regularly and reports it is "normal" - no actual readings available during call  Recommendations/Changes made from today's visit: -No med changes advised -Consider Covid booster vaccine  Plan: -[place] will call patient 4 months for BP call -Pharmacist follow up televisit scheduled for 1 year   Subjective: [name] is an 87 y.o. year old female who is a primary patient of Bedsole, Amy E, MD.  The CCM team was consulted for assistance with disease management and care coordination needs.    Engaged with patient by telephone for initial visit in response to provider referral for pharmacy case management and/or care coordination services.   Consent to Services:  The patient was given the following information about Chronic Care Management services today, agreed to services, and gave verbal consent: 1. CCM service includes personalized support from designated clinical staff supervised by the primary care provider, including individualized plan of care and coordination with other care providers 2. 24/7 contact phone numbers for assistance for urgent and routine care needs. 3. Service will only be billed when office clinical staff spend 20 minutes or more in a month to coordinate care. 4. Only one practitioner may furnish and bill the service in a calendar month. 5.The patient may stop CCM services at any time (effective at the end of the month) by phone call to the office staff. 6. The patient will be responsible for cost sharing (co-pay) of up to 20% of the service fee (after annual deductible is met). Patient agreed to services and consent obtained.  Patient Care Team: Jinny Sanders, MD as PCP - General Mixon, Vinie Sill  as Consulting Physician (Gastroenterology) Charlton Haws, [facility] as Pharmacist (Pharmacist)  Patient lives at home with her husband.   Recent office visits: 07/09/21-PCP-Amy Bedsole,MD-Patient presented for follow up Hypertension.Flu vaccine given Labs ordered (stable and in normal range)no medication changes   07/02/21-PCP-Amy Bedsole,MD-Patient presented for fatigue, increase BP.Increase losartan to 50 mg daily (take 2 tabs of 25 mg daily).Goal BP < 150/90.  06/25/21-PCP-Amy Bedsole,MD-Patient presented for hospital follow up.Reviewed labs, imaging, referral to Neurology. Start losartan 64m take 1 tablet at bedtime.  Recent consult visits: None  [facility]Patient presented for dizziness and lightheadedness. BP 164/81. Given Meclizine 12.5 mg and IV fluids.  Medication Reconciliation was completed by comparing discharge summary, patient's EMR and Pharmacy list, and upon discussion with patient.   06/13/21 thru 06/16/21-[facility]- Admission for dizziness. ? 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 Improved with PT/OT.  [facility] -started Meclizine 25 mg PRN due to dizziness -started aspirin 81 mg due to chronic cerebellar infarcts -started Clonidine 0.1 mg PRN for SBP > 150  Medications Discontinued at Hospital Discharge: -Stopped chlorthalidone (on hold) due to rapid fluctuations in P  Medications that remain the same after Hospital Discharge:??  -All other medications will remain the same.    06/03/21-[facility] ED-Patient presented for hypertension as a result of home BP 200/120.CT-scan ordered,labs ordered (all reassuring) BP improving so patient is discharged to home.   Objective:  Lab Results  Component Value Date   CREATININE 0.65 07/09/2021   BUN 21 07/09/2021   GFR 79.81 07/09/2021   GFRNONAA >60  06/17/2021   GFRAA >60 09/27/2013   NA 139 07/09/2021    K 4.1 07/09/2021   CALCIUM 10.1 07/09/2021   CO2 31 07/09/2021   GLUCOSE 94 07/09/2021    Lab Results  Component Value Date/Time   HGBA1C 5.2 06/13/2021 02:29 PM   GFR 79.81 07/09/2021 10:29 AM   GFR 70.31 07/20/2019 09:38 AM    Last diabetic Eye exam: No results found for: HMDIABEYEEXA  Last diabetic Foot exam: No results found for: HMDIABFOOTEX   Lab Results  Component Value Date   CHOL 147 07/20/2019   HDL 84.60 07/20/2019   LDLCALC 41 07/20/2019   LDLDIRECT 57.8 06/13/2021   TRIG 107.0 07/20/2019   CHOLHDL 2 07/20/2019    Hepatic Function Latest Ref Rng & Units 06/17/2021 10/26/2020 07/20/2019  Total Protein 6.5 - 8.1 g/dL 6.5 6.9 5.7(L)  Albumin 3.5 - 5.0 g/dL 3.8 4.0 3.6  AST 15 - 41 U/L 15 12(L) 11  ALT 0 - 44 U/L _0 Alk Phosphatase 38 - 126 U/L 58 79 54  Total Bilirubin 0.3 - 1.2 mg/dL 0.9 0.6 0.9  Bilirubin, Direct 0.0 - 0.3 mg/dL - - -    Lab Results  Component Value Date/Time   TSH 1.97 07/15/2021 10:06 AM   TSH 0.56 07/23/2016 11:06 AM   FREET4 1.02 07/23/2016 11:06 AM   FREET4 1.14 04/15/2009 04:50 AM    CBC Latest Ref Rng & Units 06/17/2021 06/13/2021 06/03/2021  WBC 4.0 - 10.5 K/uL 6.3 6.1 6.0  Hemoglobin 12.0 - 15.0 g/dL 14.8 16.0(H) 15.7(H)  Hematocrit 36.0 - 46.0 % 46.4(H) 47.7(H) 47.3(H)  Platelets 150 - 400 K/uL 346 293 326    Lab Results  Component Value Date/Time   VD25OH 19.74 (L) 07/20/2019 09:38 AM   VD25OH 28.49 (L) 02/24/2018 11:13 AM    Clinical ASCVD: No  The ASCVD Risk score (Arnett DK, et al., 2019) failed to calculate for the following reasons:   The 2019 ASCVD risk score is only valid for ages 85 to 90    Depression screen PHQ 2/9 07/08/2020 06/18/2019 02/24/2018  Decreased Interest 0 0 0  Down, Depressed, Hopeless 0 0 0  PHQ - 2 Score 0 0 0  Altered sleeping 0 0 0  Tired, decreased energy 0 0 0  Change in appetite 0 0 0  Feeling bad or failure about yourself  0 0 0  Trouble concentrating 0 0 0  Moving slowly or  fidgety/restless 0 0 0  Suicidal thoughts 0 0 0  PHQ-9 Score 0 0 0  Difficult doing work/chores Not difficult at all - Not difficult at all     Social History   Tobacco Use  Smoking Status Never  Smokeless Tobacco Never   BP Readings from Last 3 Encounters:  07/09/21 120/70  07/02/21 (!) 140/98  06/25/21 (!) 146/82   Pulse Readings from Last 3 Encounters:  07/09/21 78  07/02/21 100  06/25/21 (!) 57   Wt Readings from Last 3 Encounters:  07/09/21 102 lb 4 oz (46.4 kg)  07/02/21 101 lb 8 oz (46 kg)  06/25/21 102 lb (46.3 kg)   BMI Readings from Last 3 Encounters:  07/09/21 17.83 kg/m  07/02/21 17.70 kg/m  06/25/21 17.79 kg/m    Assessment/Interventions: Review of patient past medical history, allergies, medications, health status, including review of consultants reports, laboratory and other test data, was performed as part of comprehensive evaluation and provision of chronic care management services.   SDOH:  (Social  Determinants of Health) assessments and interventions performed: Yes  SDOH Screenings   Alcohol Screen: Not on file  Depression (PHQ2-9): Not on file  Financial Resource Strain: Not on file  Food Insecurity: Not on file  Housing: Not on file  Physical Activity: Not on file  Social Connections: Not on file  Stress: Not on file  Tobacco Use: Low Risk    Smoking Tobacco Use: Never   Smokeless Tobacco Use: Never   Passive Exposure: Not on file  Transportation Needs: Not on file    [place]  No Known Allergies  Medications Reviewed Today     Reviewed by Charlton Haws, [facility] (Pharmacist) on 10/06/21 at [phone]  Med List Status: <None>   Medication Order Taking? Sig Documenting Provider Last Dose Status Informant  aspirin EC 81 MG EC tablet [id number] Yes Take 1 tablet (81 mg total) by mouth daily. Swallow whole. Thurnell Lose, MD Taking Active   Cholecalciferol (VITAMIN D PO) [id number] Yes Take 5,000 Units by mouth daily. [provider] Taking Active Self  cloNIDine (CATAPRES) 0.1 MG tablet [id number] No Take 1 tablet (0.1 mg total) by mouth daily as needed (SBP > 150).  Patient not taking: Reported on 10/06/2021   Thurnell Lose, MD Not Taking Active   colestipol (COLESTID) 1 G tablet [id number] Yes Take 2 g by mouth 3 (three) times daily.  [provider] Taking Active Self  diltiazem (DILACOR XR) 240 MG 24 hr capsule [id number] Yes Take 1 capsule (240 mg total) by mouth daily. Jinny Sanders, MD Taking Active   losartan (COZAAR) 100 MG tablet [id number] Yes Take 1 tablet (100 mg total) by mouth daily. 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 05/2021 07/01/2021   Lesion of right frontal lobe of brain 07/01/2021   History of stroke involving cerebellum    Abnormal head CT    Right-sided low back pain without sciatica 01/25/2019   Anterior neck pain 07/23/2016   Hypokalemia 01/27/2016   Abnormal weight loss 01/27/2016   Counseling regarding end of life decision making 01/24/2015   Vitamin D deficiency 12/12/2012   Chronic diarrhea 10/27/2009   RECTAL CANCER 07/23/2009   ALLERGIC RHINITIS 01/16/2009   HEMORRHOIDS, INTERNAL 11/20/2008   ANXIETY, SITUATIONAL 05/31/2007   Hyperlipidemia 05/30/2007   Essential hypertension, benign 05/30/2007   Osteoarthritis of knees, bilateral 05/30/2007   At high risk for osteoporosis, no DEXA 05/30/2007    Immunization History  Administered Date(s) Administered   Influenza Whole 09/25/2009   Influenza,inj,Quad PF,6+ Mos 07/09/2021   Moderna Sars-Covid-2 Vaccination 12/21/2019, 01/20/2020   Pneumococcal Conjugate-13 12/14/2013   Pneumococcal Polysaccharide-23 10/25/2004   Td 10/25/2004    Conditions to be  addressed/monitored:  Hypertension and Hyperlipidemia, Chronic diarrhea  Care Plan : [place]  Updates made by Charlton Haws, [place] since 10/06/2021 12:00 AM     Problem: Hypertension and Hyperlipidemia, Chronic diarrhea   Priority: High     Long-Range Goal: Disease mgmt   Start Date: 10/06/2021  Expected End Date: 10/06/2022  This Visit's Progress: On track  Priority: High  Note:   Current Barriers:  Unable to independently monitor therapeutic efficacy  Pharmacist  Clinical Goal(s):  Patient will achieve adherence to monitoring guidelines and medication adherence to achieve therapeutic efficacy through collaboration with PharmD and provider.   Interventions: 1:1 collaboration with Jinny Sanders, MD regarding development and update of comprehensive plan of care as evidenced by provider attestation and co-signature Inter-disciplinary care team collaboration (see longitudinal plan of care) Comprehensive medication review performed; medication list updated in electronic medical record  Hypertension (BP goal <140/90) -Controlled - pt reports her husband checks her BP regularly and is "very happy" with readings; she does not have any readings available today; she denies dizziness -Current home readings: not available during call -Current treatment: Clonidine 0.1 mg PRN SBP > 150 - has not used in months Diltiazem XR 240 mg daily Losartan 100 mg daily -Denies hypotensive/hypertensive symptoms -Educated on BP goals and benefits of medications for prevention of heart attack, stroke and kidney damage; Importance of home blood pressure monitoring; -Counseled to monitor BP at home periodically -Recommended to continue current medication  Hyperlipidemia: (LDL goal < 100) -Controlled - LDL is at goal without statin; pt takes colestipol for GI issues but it also helps with LDL lowering; she endorses adherence with aspirin and denies bleeding -Hx chronic cerebellar  infarcts on MRI 05/2021 -Current treatment: Colestipol 1 g BID Aspirin 81 mg daily -Educated on Cholesterol goals; benefits of aspirin for stroke prevention -Recommended to continue current medication  Chronic diarrhea (Goal: manage symptoms) -Controlled - pt reports symptoms are controlled with current treatment -Hx rectal cancer; small intestine surgery leading to chronic diarrhea; follows with GI annually -Current treatment  Opium 1% tincture 0.5 mL TID Colestipol 2 g BID -Recommended to continue current medication  Health Maintenance -Vaccine gaps: Shingrix, Covid booster -Declined DEXA previously; at risk for osteoporosis -Current therapy:  Vitamin D 5000 IU daily -Patient is satisfied with current therapy and denies issues -Recommended to continue current medication  Patient Goals/Self-Care Activities Patient will:  - take medications as prescribed as evidenced by patient report and record review focus on medication adherence by routine check blood pressure perodically, document, and provide at future appointments      Medication Assistance: None required.  Patient affirms current coverage meets needs.  Compliance/Adherence/Medication fill history: Care Gaps: None - AWV scheduled 10/13/21  Star-Rating Drugs: Losartan - LF 09/25/21 x 30 ds; [place] 100%  Patient's preferred pharmacy is:  [facility] [place] [place] [address] Phone: [phone] Fax: [phone]  [facility] - [place], [place] - [address] [place] [address] Phone: [phone] Fax: [phone]  Uses pill box? No - husband sets out pills for her Pt endorses 100% compliance  We discussed: Current pharmacy is preferred with insurance plan and patient is satisfied with pharmacy services Patient decided to: Continue current medication management strategy  Care Plan and Follow Up Patient Decision:  Patient agrees to  Care Plan and Follow-up.  Plan: Telephone follow up appointment with care management team member scheduled for:  1 year  Charlene Brooke, PharmD, [facility] Clinical Pharmacist [facility] at [facility] [phone]

## 2021-10-13 ENCOUNTER — Encounter: Payer: Medicare Other | Admitting: Family Medicine

## 2022-01-26 ENCOUNTER — Telehealth: Payer: Self-pay

## 2022-01-26 NOTE — Progress Notes (Signed)
? ? ?Chronic Care Management ?Pharmacy Assistant  ? ?Name: [name]  MRN: [id number] DOB: [birth year] ? ?Reason for Encounter: CCM (Hypertension Disease State) ?  ?Recent office visits:  ?None since last CCM contact ? ?Recent consult visits:  ?None since last CCM contact ? ?Hospital visits:  ?None in previous 6 months ? ?Medications: ?Outpatient Encounter Medications as of 01/26/2022  ?Medication Sig  ? aspirin EC 81 MG EC tablet Take 1 tablet (81 mg total) by mouth daily. Swallow whole.  ? Cholecalciferol (VITAMIN D PO) Take 5,000 Units by mouth daily.  ? cloNIDine (CATAPRES) 0.1 MG tablet Take 1 tablet (0.1 mg total) by mouth daily as needed (SBP > 150). (Patient not taking: Reported on 10/06/2021)  ? colestipol (COLESTID) 1 G tablet Take 2 g by mouth 2 (two) times daily.  ? diltiazem (DILACOR XR) 240 MG 24 hr capsule Take 1 capsule (240 mg total) by mouth daily.  ? losartan (COZAAR) 100 MG tablet Take 1 tablet (100 mg total) by mouth daily.  ? Opium 10 MG/ML (1%) TINC Please take 0.2-0.4 mL three times per day  ? ?No facility-administered encounter medications on file as of 01/26/2022.  ? ?Recent Office Vitals: ?BP Readings from Last 3 Encounters:  ?07/09/21 120/70  ?07/02/21 (!) 140/98  ?06/25/21 (!) 146/82  ? ?Pulse Readings from Last 3 Encounters:  ?07/09/21 78  ?07/02/21 100  ?06/25/21 (!) 57  ?  ?Wt Readings from Last 3 Encounters:  ?07/09/21 102 lb 4 oz (46.4 kg)  ?07/02/21 101 lb 8 oz (46 kg)  ?06/25/21 102 lb (46.3 kg)  ?  ?Kidney Function ?Lab Results  ?Component Value Date/Time  ? CREATININE 0.65 07/09/2021 10:29 AM  ? CREATININE 0.76 06/17/2021 12:23 PM  ? CREATININE 0.66 09/27/2013 11:44 AM  ? CREATININE 0.50 (L) 03/31/2013 10:30 PM  ? GFR 79.81 07/09/2021 10:29 AM  ? GFRNONAA >60 06/17/2021 12:23 PM  ? GFRNONAA >60 09/27/2013 11:44 AM  ? GFRAA >60 09/27/2013 11:44 AM  ? ? ?  Latest Ref Rng & Units 07/09/2021  ? 10:29 AM 06/17/2021  ? 12:23 PM 06/13/2021  ?  9:19 AM  ?BMP  ?Glucose 70 - 99 mg/dL 94   114    113    ?BUN 6 - 23 mg/dL 21   32   17    ?Creatinine 0.40 - 1.20 mg/dL 0.65   0.76   0.58    ?Sodium 135 - 145 mEq/L 139   134   137    ?Potassium 3.5 - 5.1 mEq/L 4.1   4.0   4.4    ?Chloride 96 - 112 mEq/L 102   97   101    ?CO2 19 - 32 mEq/L '31   30   26    '$ ?Calcium 8.4 - 10.5 mg/dL 10.1   10.0   9.8    ? ?Contacted patient on 01/26/2022 to discuss hypertension disease state ? ?Current antihypertensive regimen:  ?Diltiazem XR 240 mg daily - last filled 11/25/2021 for 90ds ?Losartan 100 mg daily ? Patient verbally confirms she is taking the above medications as directed. Yes ? ?How often are you checking your Blood Pressure? Patient has not been checking it at home. I asked patient to take her blood pressure daily and record it. I advised her I would call back on 01/29/2022 for her log. Patient understood and agreed.    ? ?Wrist or arm cuff: Arm cuff ?Caffeine intake: Patient drinks 1 cup of coffee in the morning;  occasionally tea ?Salt intake: Does not salt her food; only when cooking.  ?Over the counter medications including pseudoephedrine or NSAIDs? Occasionally she will take Ibuprofen; maybe once every six months.  ? ?What recent interventions/DTPs have been made by any provider to improve Blood Pressure control since last CPP Visit: Continue current medications.  ? ?Any recent hospitalizations or ED visits since last visit with CPP? No ? ?What diet changes have been made to improve Blood Pressure Control?  ?Patient thinks she eats healthy; she cooks at home most of the time; only eats out once every two weeks.  ? ?What exercise is being done to improve your Blood Pressure Control?  ?Patient lives in a two story house; she cleans the house.  ? ?Adherence Review: ?Is the patient currently on ACE/ARB medication? Yes ?Does the patient have >5 day gap between last estimated fill dates? No ? ?Star Rating Drugs:  ?Medication:  Last Fill: Day Supply ?Losartan 100 mg 01/21/2022 30 ? ?Care Gaps: ?Annual wellness  visit in last year? No ?Most Recent BP reading:  120/70 on 07/09/2021 ? ?Upcoming appointments: ?No appointments scheduled within the next 30 days. ? ?Charlene Brooke, CPP notified ? ?Marijean Niemann, RMA ?Clinical Pharmacy Assistant ?[phone] ? ? ? ? ?

## 2022-01-29 NOTE — Progress Notes (Signed)
Called patient for blood pressure log as previously discussed. No answer; left a message with husband.  ? ?Charlene Brooke, CPP notified ? ?Marijean Niemann, RMA ?Clinical Pharmacy Assistant ?[phone] ? ? ?

## 2022-02-01 NOTE — Progress Notes (Signed)
Called patient for blood pressure log as previously discussed. Patient had three days recorded.  ? ?Date  Blood Pressure ?04/06  140/80 ?04/05  139/76 ?04/04  144/86 ? ?Charlene Brooke, CPP notified ? ?Marijean Niemann, RMA ?Clinical Pharmacy Assistant ?[phone] ? ? ?

## 2022-03-17 ENCOUNTER — Ambulatory Visit (INDEPENDENT_AMBULATORY_CARE_PROVIDER_SITE_OTHER): Payer: Medicare Other

## 2022-03-17 VITALS — Ht 64.0 in | Wt 106.0 lb

## 2022-03-17 DIAGNOSIS — Z0000 Encounter for general adult medical examination without abnormal findings: Secondary | ICD-10-CM

## 2022-03-17 NOTE — Progress Notes (Signed)
I connected with [name] today by telephone and verified that I am speaking with the correct person using two identifiers. Location patient: home Location provider: work Persons participating in the virtual visit: Tiffannie Schroyer, Glenna Durand LPN.   I discussed the limitations, risks, security and privacy concerns of performing an evaluation and management service by telephone and the availability of in person appointments. I also discussed with the patient that there may be a patient responsible charge related to this service. The patient expressed understanding and verbally consented to this telephonic visit.    Interactive audio and video telecommunications were attempted between this provider and patient, however failed, due to patient having technical difficulties OR patient did not have access to video capability.  We continued and completed visit with audio only.     Vital signs may be patient reported or missing.  Subjective:   [name] is a 88 y.o. female who presents for Medicare Annual (Subsequent) preventive examination.  Review of Systems     Cardiac Risk Factors include: advanced age (>93mn, >>86women);dyslipidemia;hypertension     Objective:    Today's Vitals   03/17/22 1610  Weight: 106 lb (48.1 kg)  Height: '5\' 4"'$  (1.626 m)   Body mass index is 18.19 kg/m.     03/17/2022    4:14 PM 06/13/2021    9:22 AM 10/26/2020    3:18 PM 07/08/2020    4:03 PM 06/18/2019   11:21 AM 02/24/2018   11:04 AM 01/18/2017    9:32 AM  Advanced Directives  Does Patient Have a Medical Advance Directive? Yes No Yes Yes Yes Yes Yes  Type of Advance Directive Living will  [place]  Does patient want to make changes to medical advance directive?   No - Patient declined      Copy of [place]?    No - copy requested No  - copy requested  No - copy requested    Current Medications (verified) Outpatient Encounter Medications as of 03/17/2022  Medication Sig   aspirin EC 81 MG EC tablet Take 1 tablet (81 mg total) by mouth daily. Swallow whole.   Cholecalciferol (VITAMIN D PO) Take 5,000 Units by mouth daily.   colestipol (COLESTID) 1 G tablet Take 2 g by mouth 2 (two) times daily.   diltiazem (DILACOR XR) 240 MG 24 hr capsule Take 1 capsule (240 mg total) by mouth daily.   losartan (COZAAR) 100 MG tablet Take 1 tablet (100 mg total) by mouth daily.   Opium 10 MG/ML (1%) TINC Please take 0.2-0.4 mL three times per day   cloNIDine (CATAPRES) 0.1 MG tablet Take 1 tablet (0.1 mg total) by mouth daily as needed (SBP > 150). (Patient not taking: Reported on 10/06/2021)   No facility-administered encounter medications on file as of 03/17/2022.    Allergies (verified) Patient has no known allergies.   History: Past Medical History:  Diagnosis Date   Allergic rhinitis, cause unspecified    Diarrhea    Edema    History of colon cancer    s/p surgery, chemotherapy   Internal hemorrhoids without mention of complication    Malignant neoplasm of rectum (HCC)    Osteoarthrosis, unspecified whether generalized or localized, unspecified site    Osteoporosis, unspecified    Other acute reactions to stress    Other and unspecified hyperlipidemia  Other screening mammogram    Pain in limb    Special screening for osteoporosis    Ulceration of vulva, unspecified    Unspecified essential hypertension    Past Surgical History:  Procedure Laterality Date   dental implants     PARTIAL COLECTOMY  9/10   temporary colostomy   Family History  Problem Relation Age of Onset   Stroke Mother    Arthritis Mother    Arthritis Brother    Depression Sister    Social History   Socioeconomic History   Marital status: Married    Spouse name: Not on file   Number of children: 3   Years of education: Not on file    Highest education level: Not on file  Occupational History   Occupation: [employer]  Tobacco Use   Smoking status: Never   Smokeless tobacco: Never  Vaping Use   Vaping Use: Never used  Substance and Sexual Activity   Alcohol use: No    Alcohol/week: 0.0 standard drinks   Drug use: Yes    Types: Opium   Sexual activity: Yes  Other Topics Concern   Not on file  Social History Narrative   Not on file   Social Determinants of Health   Financial Resource Strain: Low Risk    Difficulty of Paying Living Expenses: Not hard at all  Food Insecurity: No Food Insecurity   Worried About Charity fundraiser in the Last Year: Never true   [place] in the Last Year: Never true  Transportation Needs: No Transportation Needs   Lack of Transportation (Medical): No   Lack of Transportation (Non-Medical): No  Physical Activity: Inactive   Days of Exercise per Week: 0 days   Minutes of Exercise per Session: 0 min  Stress: No Stress Concern Present   Feeling of Stress : Not at all  Social Connections: Not on file    Tobacco Counseling Counseling given: Not Answered   Clinical Intake:  Pre-visit preparation completed: Yes  Pain : No/denies pain     Nutritional Status: BMI <19  Underweight Nutritional Risks: None Diabetes: No  How often do you need to have someone help you when you read instructions, pamphlets, or other written materials from your doctor or pharmacy?: 1 - Never  Diabetic? no  Interpreter Needed?: No  Information entered by :: NAllen LPN   Activities of Daily Living    03/17/2022    4:15 PM 06/13/2021   11:40 PM  In your present state of health, do you have any difficulty performing the following activities:  Hearing? 0   Vision? 0   Difficulty concentrating or making decisions? 0   Walking or climbing stairs? 0   Dressing or bathing? 0   Doing errands, shopping? 0 0  Preparing Food and eating ? N   Using the Toilet? N   In the  past six months, have you accidently leaked urine? N   Do you have problems with loss of bowel control? N   Managing your Medications? N   Managing your Finances? N   Housekeeping or managing your Housekeeping? N     Patient Care Team: Jinny Sanders, MD as PCP - General Mixon, Vinie Sill as Consulting Physician (Gastroenterology) Charlton Haws, [facility] as Pharmacist (Pharmacist)  Indicate any recent Medical Services you may have received from other than [facility] providers in the past year (date may be approximate).     Assessment:   This is  a routine wellness examination for [name].  Hearing/Vision screen Vision Screening - Comments:: No regular eye exams  Dietary issues and exercise activities discussed: Current Exercise Habits: The patient does not participate in regular exercise at present   Goals Addressed             This Visit's Progress    Patient Stated       03/17/2022, no golas       Depression Screen    03/17/2022    4:15 PM 07/08/2020    4:04 PM 06/18/2019   11:22 AM 02/24/2018   11:03 AM 01/18/2017    9:34 AM 01/22/2016   11:10 AM 01/24/2015   12:42 PM  PHQ 2/9 Scores  PHQ - 2 Score 0 0 0 0 0 0 0  PHQ- 9 Score  0 0 0       Fall Risk    03/17/2022    4:15 PM 07/08/2020    4:03 PM 06/18/2019   11:22 AM 02/24/2018   11:03 AM 01/18/2017    9:34 AM  Fall Risk   Falls in the past year? 0 0 0 Yes No  Comment    fell after tripping over cords   Number falls in past yr: 0 0  1   Injury with Fall? 0 0  Yes   Risk for fall due to : Medication side effect Medication side effect Medication side effect    Follow up Falls evaluation completed;Education provided;Falls prevention discussed Falls evaluation completed;Falls prevention discussed Falls evaluation completed;Falls prevention discussed      FALL RISK PREVENTION PERTAINING TO THE HOME:  Any stairs in or around the home? Yes  If so, are there any without handrails? No  Home free of loose throw rugs in  walkways, pet beds, electrical cords, etc? Yes  Adequate lighting in your home to reduce risk of falls? Yes   ASSISTIVE DEVICES UTILIZED TO PREVENT FALLS:  Life alert? No  Use of a cane, walker or w/c? No  Grab bars in the bathroom? No  Shower chair or bench in shower? Yes  Elevated toilet seat or a handicapped toilet? No   TIMED UP AND GO:  Was the test performed? No .      Cognitive Function:    07/08/2020    4:07 PM 06/18/2019   11:24 AM 02/24/2018   10:38 AM 01/18/2017    9:35 AM 01/22/2016   11:15 AM  MMSE - Mini Mental State Exam  Not completed: Refused      Orientation to time  '4 5 5 5  '$ Orientation to Place  '5 5 5 5  '$ Registration  '3 3 3 3  '$ Attention/ Calculation  5 0 0 0  Recall  '2 3 3 3  '$ Language- name 2 objects  0 0 0 0  Language- repeat  '1 1 1 1  '$ Language- follow 3 step command  0 '3 3 3  '$ Language- read & follow direction  0 0 0 0  Write a sentence  0 0 0 0  Copy design  0 0 0 0  Total score  '20 20 20 20        '$ 03/17/2022    4:17 PM  6CIT Screen  What Year? 0 points  What month? 0 points  What time? 3 points  Count back from 20 0 points  Months in reverse 0 points  Repeat phrase 0 points  Total Score 3 points    Immunizations Immunization History  Administered  Date(s) Administered   Influenza Whole 09/25/2009   Influenza,inj,Quad PF,6+ Mos 07/09/2021   Moderna Sars-Covid-2 Vaccination 12/21/2019, 01/20/2020   Pneumococcal Conjugate-13 12/14/2013   Pneumococcal Polysaccharide-23 10/25/2004   Td 10/25/2004    TDAP status: Due, Education has been provided regarding the importance of this vaccine. Advised may receive this vaccine at local pharmacy or Health Dept. Aware to provide a copy of the vaccination record if obtained from local pharmacy or Health Dept. Verbalized acceptance and understanding.  Flu Vaccine status: Up to date  Pneumococcal vaccine status: Up to date  Covid-19 vaccine status: Completed vaccines  Qualifies for Shingles  Vaccine? Yes   Zostavax completed No   Shingrix Completed?: No.    Education has been provided regarding the importance of this vaccine. Patient has been advised to call insurance company to determine out of pocket expense if they have not yet received this vaccine. Advised may also receive vaccine at local pharmacy or Health Dept. Verbalized acceptance and understanding.  Screening Tests Health Maintenance  Topic Date Due   Zoster Vaccines- Shingrix (1 of 2) Never done   COVID-19 Vaccine (3 - Moderna risk series) 02/17/2020   DEXA SCAN  12/24/2023 (Originally 04/03/2000)   TETANUS/TDAP  10/24/2024 (Originally 10/25/2014)   INFLUENZA VACCINE  05/25/2022   Pneumonia Vaccine 25+ Years old  Completed   HPV VACCINES  Aged Out    Health Maintenance  Health Maintenance Due  Topic Date Due   Zoster Vaccines- Shingrix (1 of 2) Never done   COVID-19 Vaccine (3 - Moderna risk series) 02/17/2020    Colorectal cancer screening: No longer required.   Mammogram status: No longer required due to age.  Bone Density status: decline  Lung Cancer Screening: (Low Dose CT Chest recommended if Age 48-80 years, 30 pack-year currently smoking OR have quit w/in 15years.) does not qualify.   Lung Cancer Screening Referral: no  Additional Screening:  Hepatitis C Screening: does not qualify;   Vision Screening: Recommended annual ophthalmology exams for early detection of glaucoma and other disorders of the eye. Is the patient up to date with their annual eye exam?  No  Who is the provider or what is the name of the office in which the patient attends annual eye exams? none If pt is not established with a provider, would they like to be referred to a provider to establish care? No .   Dental Screening: Recommended annual dental exams for proper oral hygiene  Community Resource Referral / Chronic Care Management: CRR required this visit?  No   CCM required this visit?  No      Plan:     I have  personally reviewed and noted the following in the patient's chart:   Medical and social history Use of alcohol, tobacco or illicit drugs  Current medications and supplements including opioid prescriptions.  Functional ability and status Nutritional status Physical activity Advanced directives List of other physicians Hospitalizations, surgeries, and ER visits in previous 12 months Vitals Screenings to include cognitive, depression, and falls Referrals and appointments  In addition, I have reviewed and discussed with patient certain preventive protocols, quality metrics, and best practice recommendations. A written personalized care plan for preventive services as well as general preventive health recommendations were provided to patient.     Kellie Simmering, LPN   [id number]   Nurse Notes: none  Due to this being a virtual visit, the after visit summary with patients personalized plan was offered to patient via mail or  my-chart. Patient declined at this time

## 2022-03-17 NOTE — Patient Instructions (Signed)
[name] , Thank you for taking time to come for your Medicare Wellness Visit. I appreciate your ongoing commitment to your health goals. Please review the following plan we discussed and let me know if I can assist you in the future.   Screening recommendations/referrals: Colonoscopy: not required Mammogram: not required Bone Density: decline Recommended yearly ophthalmology/optometry visit for glaucoma screening and checkup Recommended yearly dental visit for hygiene and checkup  Vaccinations: Influenza vaccine: due 05/25/2022 Pneumococcal vaccine: completed 12/14/2013 Tdap vaccine: due Shingles vaccine: discussed   Covid-19: 01/20/2020, 12/21/2019  Advanced directives: Please bring a copy of your POA (Power of Attorney) and/or Living Will to your next appointment.   Conditions/risks identified: none  Next appointment: Follow up in one year for your annual wellness visit    Preventive Care 65 Years and Older, Female Preventive care refers to lifestyle choices and visits with your health care provider that can promote health and wellness. What does preventive care include? A yearly physical exam. This is also called an annual well check. Dental exams once or twice a year. Routine eye exams. Ask your health care provider how often you should have your eyes checked. Personal lifestyle choices, including: Daily care of your teeth and gums. Regular physical activity. Eating a healthy diet. Avoiding tobacco and drug use. Limiting alcohol use. Practicing safe sex. Taking low-dose aspirin every day. Taking vitamin and mineral supplements as recommended by your health care provider. What happens during an annual well check? The services and screenings done by your health care provider during your annual well check will depend on your age, overall health, lifestyle risk factors, and family history of disease. Counseling  Your health care provider may ask you questions about  your: Alcohol use. Tobacco use. Drug use. Emotional well-being. Home and relationship well-being. Sexual activity. Eating habits. History of falls. Memory and ability to understand (cognition). Work and work Statistician. Reproductive health. Screening  You may have the following tests or measurements: Height, weight, and BMI. Blood pressure. Lipid and cholesterol levels. These may be checked every 5 years, or more frequently if you are over 33 years old. Skin check. Lung cancer screening. You may have this screening every year starting at age 19 if you have a 30-pack-year history of smoking and currently smoke or have quit within the past 15 years. Fecal occult blood test (FOBT) of the stool. You may have this test every year starting at age 9. Flexible sigmoidoscopy or colonoscopy. You may have a sigmoidoscopy every 5 years or a colonoscopy every 10 years starting at age 75. Hepatitis C blood test. Hepatitis B blood test. Sexually transmitted disease (STD) testing. Diabetes screening. This is done by checking your blood sugar (glucose) after you have not eaten for a while (fasting). You may have this done every 1-3 years. Bone density scan. This is done to screen for osteoporosis. You may have this done starting at age 76. Mammogram. This may be done every 1-2 years. Talk to your health care provider about how often you should have regular mammograms. Talk with your health care provider about your test results, treatment options, and if necessary, the need for more tests. Vaccines  Your health care provider may recommend certain vaccines, such as: Influenza vaccine. This is recommended every year. Tetanus, diphtheria, and acellular pertussis (Tdap, Td) vaccine. You may need a Td booster every 10 years. Zoster vaccine. You may need this after age 83. Pneumococcal 13-valent conjugate (PCV13) vaccine. One dose is recommended after age 54.  Pneumococcal polysaccharide (PPSV23) vaccine.  One dose is recommended after age 20. Talk to your health care provider about which screenings and vaccines you need and how often you need them. This information is not intended to replace advice given to you by your health care provider. Make sure you discuss any questions you have with your health care provider. Document Released: 11/07/2015 Document Revised: 06/30/2016 Document Reviewed: 08/12/2015 Elsevier Interactive Patient Education  2017 [employer] Prevention in the Home Falls can cause injuries. They can happen to people of all ages. There are many things you can do to make your home safe and to help prevent falls. What can I do on the outside of my home? Regularly fix the edges of walkways and driveways and fix any cracks. Remove anything that might make you trip as you walk through a door, such as a raised step or threshold. Trim any bushes or trees on the path to your home. Use bright outdoor lighting. Clear any walking paths of anything that might make someone trip, such as rocks or tools. Regularly check to see if handrails are loose or broken. Make sure that both sides of any steps have handrails. Any raised decks and porches should have guardrails on the edges. Have any leaves, snow, or ice cleared regularly. Use sand or salt on walking paths during winter. Clean up any spills in your garage right away. This includes oil or grease spills. What can I do in the bathroom? Use night lights. Install grab bars by the toilet and in the tub and shower. Do not use towel bars as grab bars. Use non-skid mats or decals in the tub or shower. If you need to sit down in the shower, use a plastic, non-slip stool. Keep the floor dry. Clean up any water that spills on the floor as soon as it happens. Remove soap buildup in the tub or shower regularly. Attach bath mats securely with double-sided non-slip rug tape. Do not have throw rugs and other things on the floor that can make  you trip. What can I do in the bedroom? Use night lights. Make sure that you have a light by your bed that is easy to reach. Do not use any sheets or blankets that are too big for your bed. They should not hang down onto the floor. Have a firm chair that has side arms. You can use this for support while you get dressed. Do not have throw rugs and other things on the floor that can make you trip. What can I do in the kitchen? Clean up any spills right away. Avoid walking on wet floors. Keep items that you use a lot in easy-to-reach places. If you need to reach something above you, use a strong step stool that has a grab bar. Keep electrical cords out of the way. Do not use floor polish or wax that makes floors slippery. If you must use wax, use non-skid floor wax. Do not have throw rugs and other things on the floor that can make you trip. What can I do with my stairs? Do not leave any items on the stairs. Make sure that there are handrails on both sides of the stairs and use them. Fix handrails that are broken or loose. Make sure that handrails are as long as the stairways. Check any carpeting to make sure that it is firmly attached to the stairs. Fix any carpet that is loose or worn. Avoid having throw rugs at the  top or bottom of the stairs. If you do have throw rugs, attach them to the floor with carpet tape. Make sure that you have a light switch at the top of the stairs and the bottom of the stairs. If you do not have them, ask someone to add them for you. What else can I do to help prevent falls? Wear shoes that: Do not have high heels. Have rubber bottoms. Are comfortable and fit you well. Are closed at the toe. Do not wear sandals. If you use a stepladder: Make sure that it is fully opened. Do not climb a closed stepladder. Make sure that both sides of the stepladder are locked into place. Ask someone to hold it for you, if possible. Clearly mark and make sure that you can  see: Any grab bars or handrails. First and last steps. Where the edge of each step is. Use tools that help you move around (mobility aids) if they are needed. These include: Canes. Walkers. Scooters. Crutches. Turn on the lights when you go into a dark area. Replace any light bulbs as soon as they burn out. Set up your furniture so you have a clear path. Avoid moving your furniture around. If any of your floors are uneven, fix them. If there are any pets around you, be aware of where they are. Review your medicines with your doctor. Some medicines can make you feel dizzy. This can increase your chance of falling. Ask your doctor what other things that you can do to help prevent falls. This information is not intended to replace advice given to you by your health care provider. Make sure you discuss any questions you have with your health care provider. Document Released: 08/07/2009 Document Revised: 03/18/2016 Document Reviewed: 11/15/2014 Elsevier Interactive Patient Education  2017 [employer].

## 2022-05-11 DIAGNOSIS — R197 Diarrhea, unspecified: Secondary | ICD-10-CM | POA: Diagnosis not present

## 2022-05-28 ENCOUNTER — Telehealth: Payer: Self-pay

## 2022-05-28 NOTE — Progress Notes (Signed)
Chronic Care Management Pharmacy Assistant   Name: [name]  MRN: [id number] DOB: 1933/10/13  Reason for Encounter: CCM (Hypertension Disease State)  Recent office visits:  03/17/22 AWV  Recent consult visits:  05/11/22 Randall Hiss, NP Gertie Fey) Chronic Diarrhea No med changes FU 1 year  Hospital visits:  None in previous 6 months  Medications: Outpatient Encounter Medications as of 05/28/2022  Medication Sig   aspirin EC 81 MG EC tablet Take 1 tablet (81 mg total) by mouth daily. Swallow whole.   Cholecalciferol (VITAMIN D PO) Take 5,000 Units by mouth daily.   cloNIDine (CATAPRES) 0.1 MG tablet Take 1 tablet (0.1 mg total) by mouth daily as needed (SBP > 150). (Patient not taking: Reported on 10/06/2021)   colestipol (COLESTID) 1 G tablet Take 2 g by mouth 2 (two) times daily.   diltiazem (DILACOR XR) 240 MG 24 hr capsule Take 1 capsule (240 mg total) by mouth daily.   losartan (COZAAR) 100 MG tablet Take 1 tablet (100 mg total) by mouth daily.   Opium 10 MG/ML (1%) TINC Please take 0.2-0.4 mL three times per day   No facility-administered encounter medications on file as of 05/28/2022.    Recent Office Vitals: BP Readings from Last 3 Encounters:  07/09/21 120/70  07/02/21 (!) 140/98  06/25/21 (!) 146/82   Pulse Readings from Last 3 Encounters:  07/09/21 78  07/02/21 100  06/25/21 (!) 57    Wt Readings from Last 3 Encounters:  03/17/22 106 lb (48.1 kg)  07/09/21 102 lb 4 oz (46.4 kg)  07/02/21 101 lb 8 oz (46 kg)     Kidney Function Lab Results  Component Value Date/Time   CREATININE 0.65 07/09/2021 10:29 AM   CREATININE 0.76 06/17/2021 12:23 PM   CREATININE 0.66 09/27/2013 11:44 AM   CREATININE 0.50 (L) 03/31/2013 10:30 PM   GFR 79.81 07/09/2021 10:29 AM   GFRNONAA >60 06/17/2021 12:23 PM   GFRNONAA >60 09/27/2013 11:44 AM   GFRAA >60 09/27/2013 11:44 AM       Latest Ref Rng & Units 07/09/2021   10:29 AM 06/17/2021   12:23 PM 06/13/2021    9:19  AM  BMP  Glucose 70 - 99 mg/dL 94  114  113   BUN 6 - 23 mg/dL 21  32  17   Creatinine 0.40 - 1.20 mg/dL 0.65  0.76  0.58   Sodium 135 - 145 mEq/L 139  134  137   Potassium 3.5 - 5.1 mEq/L 4.1  4.0  4.4   Chloride 96 - 112 mEq/L 102  97  101   CO2 19 - 32 mEq/L '31  30  26   '$ Calcium 8.4 - 10.5 mg/dL 10.1  10.0  9.8    Contacted patient on 06/01/22 to discuss hypertension disease state  Current antihypertensive regimen:  Diltiazem XR 240 mg daily  Losartan 100 mg daily  Patient verbally confirms she is taking the above medications as directed. Yes  How often are you checking your Blood Pressure? Patient has not been taking her blood pressure. I have asked patient to take their blood pressure daily and keep a log. Advised patient I would call back on 06/07/22 for log. Patient verbalized understanding and agreed.   Wrist or arm cuff: Arm cuff Caffeine intake: Patient drinks 1 cup of coffee in the morning; occasionally tea Salt intake: Does not salt her food; only when cooking.  Over the counter medications including pseudoephedrine or NSAIDs? Occasionally she  will take Ibuprofen; maybe once every six months.   What recent interventions/DTPs have been made by any provider to improve Blood Pressure control since last CPP Visit: No recent interventions  Any recent hospitalizations or ED visits since last visit with CPP? No  What diet changes have been made to improve Blood Pressure Control?  Patient states she eats a balanced diet.  What exercise is being done to improve your Blood Pressure Control?  Patient stated she does get outside, but she does not do anything special.   Adherence Review: Is the patient currently on ACE/ARB medication? Yes Does the patient have >5 day gap between last estimated fill dates? No  Star Rating Drugs:  Medication:  Last Fill: Day Supply Losartan 100 mg    05/17/22 30  Care Gaps: Annual wellness visit in last year? Yes 03/17/22 Most Recent BP  reading: 120/70 on 07/09/21  Upcoming appointments: PCP appointment on 10/05/22  Charlene Brooke, CPP notified  Marijean Niemann, [place] Assistant [phone]

## 2022-06-22 ENCOUNTER — Other Ambulatory Visit: Payer: Self-pay | Admitting: Family Medicine

## 2022-06-22 NOTE — Telephone Encounter (Signed)
Called patient but call couldn't be completed.

## 2022-06-22 NOTE — Telephone Encounter (Signed)
Please schedule CPE with fasting labs prior with Dr. Bedsole.  

## 2022-08-23 ENCOUNTER — Other Ambulatory Visit: Payer: Self-pay | Admitting: Family Medicine

## 2022-08-23 NOTE — Telephone Encounter (Signed)
LVM for patient to call and schedule

## 2022-08-23 NOTE — Telephone Encounter (Signed)
Please call and schedule CPE with fasting labs prior with Dr. Bedsole. 

## 2022-09-08 ENCOUNTER — Other Ambulatory Visit: Payer: Self-pay | Admitting: Family Medicine

## 2022-09-08 NOTE — Telephone Encounter (Signed)
Last office visit 07/09/2021 for HTN.  Patient did have [place] with nurse on 03/17/22.  Office and tried to reach her to set up CPE with no return call.  No future appointments.  Refill?

## 2022-09-20 ENCOUNTER — Other Ambulatory Visit: Payer: Self-pay | Admitting: Family Medicine

## 2022-09-20 NOTE — Telephone Encounter (Signed)
Last office visit 07/09/2021 for HTN.  Patient did have [place] with nurse on 03/17/22.  Office and tried to reach her to set up CPE with no return call.  No future appointments.  Refill?

## 2022-09-27 ENCOUNTER — Telehealth: Payer: Self-pay

## 2022-09-27 ENCOUNTER — Other Ambulatory Visit: Payer: Self-pay | Admitting: Family Medicine

## 2022-09-27 NOTE — Progress Notes (Signed)
Chronic Care Management Pharmacy Assistant   Name: [name]  MRN: [id number] DOB: 1933-07-09  Reason for Encounter: CCM (General Adherence)  Recent office visits:  None since last CCM contact  Recent consult visits:  None since last CCM contact  Hospital visits:  None in previous 6 months  Medications: Outpatient Encounter Medications as of 09/27/2022  Medication Sig   aspirin EC 81 MG EC tablet Take 1 tablet (81 mg total) by mouth daily. Swallow whole.   Cholecalciferol (VITAMIN D PO) Take 5,000 Units by mouth daily.   cloNIDine (CATAPRES) 0.1 MG tablet Take 1 tablet (0.1 mg total) by mouth daily as needed (SBP > 150). (Patient not taking: Reported on 10/06/2021)   colestipol (COLESTID) 1 G tablet Take 2 g by mouth 2 (two) times daily.   diltiazem (CARDIZEM CD) 240 MG 24 hr capsule Take 1 capsule (240 mg total) by mouth daily.   diltiazem (DILACOR XR) 240 MG 24 hr capsule Take 1 capsule (240 mg total) by mouth daily.   losartan (COZAAR) 100 MG tablet Take 1 tablet (100 mg total) by mouth daily.   Opium 10 MG/ML (1%) TINC Please take 0.2-0.4 mL three times per day   No facility-administered encounter medications on file as of 09/27/2022.    Recent vitals BP Readings from Last 3 Encounters:  07/09/21 120/70  07/02/21 (!) 140/98  06/25/21 (!) 146/82   Pulse Readings from Last 3 Encounters:  07/09/21 78  07/02/21 100  06/25/21 (!) 57   Wt Readings from Last 3 Encounters:  03/17/22 106 lb (48.1 kg)  07/09/21 102 lb 4 oz (46.4 kg)  07/02/21 101 lb 8 oz (46 kg)   BMI Readings from Last 3 Encounters:  03/17/22 18.19 kg/m  07/09/21 17.83 kg/m  07/02/21 17.70 kg/m    Recent lab results    Component Value Date/Time   NA 139 07/09/2021 1029   NA 143 09/27/2013 1144   K 4.1 07/09/2021 1029   K 3.9 09/27/2013 1144   CL 102 07/09/2021 1029   CL 104 09/27/2013 1144   CO2 31 07/09/2021 1029   CO2 31 09/27/2013 1144   GLUCOSE 94 07/09/2021 1029   GLUCOSE 80  09/27/2013 1144   BUN 21 07/09/2021 1029   BUN 16 09/27/2013 1144   CREATININE 0.65 07/09/2021 1029   CREATININE 0.66 09/27/2013 1144   CALCIUM 10.1 07/09/2021 1029   CALCIUM 9.4 09/27/2013 1144    Lab Results  Component Value Date   CREATININE 0.65 07/09/2021   GFR 79.81 07/09/2021   GFRNONAA >60 06/17/2021   GFRAA >60 09/27/2013   Lab Results  Component Value Date/Time   HGBA1C 5.2 06/13/2021 02:29 PM    Lab Results  Component Value Date   CHOL 147 07/20/2019   HDL 84.60 07/20/2019   LDLCALC 41 07/20/2019   LDLDIRECT 57.8 06/13/2021   TRIG 107.0 07/20/2019   CHOLHDL 2 07/20/2019    Attempted contact with patient 3 times. Unsuccessful outreach. Phone number is no longer in service  Care Gaps: Annual wellness visit in last year? Yes 03/17/2022 Most Recent BP reading: 120/70 on 07/09/2021  Star Rating Drugs:  Medication:  Last Fill: Day Supply Losartan 100 mg 08/23/2022 30  Summary of recommendations from last [place] visit (Date:10/06/2021)  Summary: -Pt endorses compliance with medications as prescribed; her husband organizes and administers them to her daily -Pt's husband checks her BP regularly and reports it is "normal" - no actual readings available during call  Recommendations/Changes made from today's visit: -No med changes advised -Consider Covid booster vaccine   Plan: -[place] will call patient 4 months for BP call -Pharmacist follow up televisit scheduled for 1 year  Upcoming appointments: No appointments scheduled within the next 30 days.  Charlene Brooke, CPP notified  Marijean Niemann, [place] Clinical Pharmacy Assistant [phone]

## 2022-10-05 ENCOUNTER — Telehealth: Payer: Medicare Other

## 2022-10-28 ENCOUNTER — Ambulatory Visit (INDEPENDENT_AMBULATORY_CARE_PROVIDER_SITE_OTHER): Payer: Medicare Other

## 2022-10-28 DIAGNOSIS — Z23 Encounter for immunization: Secondary | ICD-10-CM

## 2022-12-06 ENCOUNTER — Other Ambulatory Visit: Payer: Self-pay | Admitting: Family Medicine

## 2022-12-06 NOTE — Telephone Encounter (Signed)
There is a Cardizem CD and Dilacor XR on medication list.  Which should be filled?

## 2022-12-07 ENCOUNTER — Other Ambulatory Visit: Payer: Self-pay | Admitting: Family Medicine

## 2022-12-23 ENCOUNTER — Other Ambulatory Visit: Payer: Self-pay | Admitting: Family Medicine

## 2022-12-23 NOTE — Telephone Encounter (Signed)
Patient needs in office visit for either physical or hypertension follow-up before refill can be made

## 2022-12-23 NOTE — Telephone Encounter (Signed)
Tried calling patient but call couldn't be completed.

## 2022-12-23 NOTE — Telephone Encounter (Signed)
Last office visit 07/09/2021 for HTN.  Patient did her [place] with nurse on 03/17/2022.  [place] office unable to reach patient to get CPE scheduled.  Last refilled 09/27/2022 for #90 with no refill.  Refill?

## 2022-12-23 NOTE — Telephone Encounter (Signed)
Please call and schedule CPE with fasting labs prior with Dr. Diona Browner.  Please send back to me once scheduled.

## 2022-12-24 ENCOUNTER — Other Ambulatory Visit: Payer: Self-pay | Admitting: Family Medicine

## 2023-01-03 ENCOUNTER — Telehealth: Payer: Self-pay | Admitting: Family Medicine

## 2023-01-03 NOTE — Telephone Encounter (Signed)
Prescription Request  01/03/2023  LOV: Visit date not found  What is the name of the medication or equipment? losartan (COZAAR) 100 MG tablet   Have you contacted your pharmacy to request a refill? No   Which pharmacy would you like this sent to?    [place], [place] - [place] [address] [place] [address] Phone: [phone] Fax: [phone]    Patient notified that their request is being sent to the clinical staff for review and that they should receive a response within 2 business days.   Please advise at Mobile [phone] (mobile)

## 2023-01-03 NOTE — Telephone Encounter (Signed)
Please call patient and schedule CPE with fasting labs prior with Dr. Diona Browner.  Needs to schedule appointment before refills can be given.

## 2023-01-03 NOTE — Telephone Encounter (Signed)
Tried to call patient ,no voice mail set up to lvm

## 2023-01-04 NOTE — Telephone Encounter (Signed)
Spoke to pt, scheduled cpe for tomorrow, 3/13

## 2023-01-04 NOTE — Telephone Encounter (Signed)
Patient needs an appointment for a CPE for blood pressure follow up before refills can be given per Dr. Diona Browner.  Please continue to try and reach patient to schedule.   Patient was also advised last night by nurse that she needed to call the office to schedule appointment.

## 2023-01-04 NOTE — Telephone Encounter (Signed)
Sending note to [facility] and lsc support.

## 2023-01-04 NOTE — Telephone Encounter (Signed)
[facility] Night - Client TELEPHONE ADVICE RECORD [facility] Patient Name: [name] Gender: Female DOB: [birth year] Age: 89 Y 9 M 1 D Return Phone Number: [phone] (Primary) Address: City/ State/ Zip: [place]  [address] Client [facility] Night - Client Client Site [place] Provider Eliezer Lofts - MD Contact Type Call Who Is Calling Patient / Member / Family / Caregiver Call Type Triage / Clinical Relationship To Patient Self Return Phone Number [phone] (Primary) Chief Complaint Prescription Refill or Medication Request (non symptomatic) Reason for Call Medication Question / Request Initial Comment Caller states she would like to know if her prescription has been sent in. Pt is not having any symptoms. Translation No Nurse Assessment Nurse: Tito Dine, RN, Neoma Laming Date/Time [name] Time): 01/03/2023 6:19:56 PM Confirm and document reason for call. If symptomatic, describe symptoms. ---Caller states she would like to know if her prescription has been sent in to the Pharmacy. Pt states pharmacy doesn't have it. Pt states she does not having any symptoms. Medication: Losartan 100 mg once a day Pharmacy: [phone] Does the patient have any new or worsening symptoms? ---No Please document clinical information provided and list any resource used. ---I called the pharmacist and asked for an emergency dose on Losartan 100 mg. Pharmacist is going to provide pt with 1 tablet for tonight. Pharmacist stated that refill request was previously denied because pt needs to schedule an appt with her doctor. Informed caller to call the office tomorrow during office hours to schedule a visit with her physician. Pt verbalized understanding. Disp. Time [name] Time) Disposition Final User 01/03/2023 5:58:42 PM Send To Nurse Lonia Farber, RN, Greg 01/03/2023 6:30:48 PM  Pharmacy Call Tito Dine, RN, Deborah Reason: Refill request 01/03/2023 6:31:09 PM Clinical Call Yes Tito Dine, RN, Deborah Final Disposition 01/03/2023 6:31:09 PM Clinical Call Yes Tito Dine, RN, Neoma Laming

## 2023-01-05 ENCOUNTER — Encounter: Payer: Self-pay | Admitting: Family Medicine

## 2023-01-05 ENCOUNTER — Ambulatory Visit (INDEPENDENT_AMBULATORY_CARE_PROVIDER_SITE_OTHER): Payer: Medicare Other | Admitting: Family Medicine

## 2023-01-05 ENCOUNTER — Telehealth: Payer: Self-pay | Admitting: Family Medicine

## 2023-01-05 VITALS — BP 134/76 | HR 86 | Temp 97.6°F | Ht 64.0 in | Wt 110.0 lb

## 2023-01-05 DIAGNOSIS — E559 Vitamin D deficiency, unspecified: Secondary | ICD-10-CM

## 2023-01-05 DIAGNOSIS — E782 Mixed hyperlipidemia: Secondary | ICD-10-CM | POA: Diagnosis not present

## 2023-01-05 DIAGNOSIS — Z0000 Encounter for general adult medical examination without abnormal findings: Secondary | ICD-10-CM | POA: Diagnosis not present

## 2023-01-05 DIAGNOSIS — I10 Essential (primary) hypertension: Secondary | ICD-10-CM

## 2023-01-05 DIAGNOSIS — Z9189 Other specified personal risk factors, not elsewhere classified: Secondary | ICD-10-CM

## 2023-01-05 LAB — COMPREHENSIVE METABOLIC PANEL
ALT: 14 U/L (ref 0–35)
AST: 13 U/L (ref 0–37)
Albumin: 3.9 g/dL (ref 3.5–5.2)
Alkaline Phosphatase: 67 U/L (ref 39–117)
BUN: 28 mg/dL — ABNORMAL HIGH (ref 6–23)
CO2: 26 mEq/L (ref 19–32)
Calcium: 9.8 mg/dL (ref 8.4–10.5)
Chloride: 106 mEq/L (ref 96–112)
Creatinine, Ser: 0.66 mg/dL (ref 0.40–1.20)
GFR: 78.69 mL/min (ref >60.00)
Glucose, Bld: 90 mg/dL (ref 70–99)
Potassium: 4.3 mEq/L (ref 3.5–5.1)
Sodium: 140 mEq/L (ref 135–145)
Total Bilirubin: 0.9 mg/dL (ref 0.2–1.2)
Total Protein: 6.2 g/dL (ref 6.0–8.3)

## 2023-01-05 LAB — VITAMIN D 25 HYDROXY (VIT D DEFICIENCY, FRACTURES): VITD: 8.46 ng/mL — ABNORMAL LOW (ref 30.00–100.00)

## 2023-01-05 MED ORDER — LOSARTAN POTASSIUM 100 MG PO TABS: 100.0000 mg | ORAL_TABLET | Freq: Every day | ORAL | 3 refills | Status: DC

## 2023-01-05 NOTE — Assessment & Plan Note (Signed)
She continues to refuse bone density but in my opinion is at high risk for osteoporosis. Recommend weight bearing exercise, calcium in diet and vit D supplement 400 IU 1-2 times daily.

## 2023-01-05 NOTE — Progress Notes (Signed)
Chief Complaint  Patient presents with   Annual Exam    History of Present Illness: HPI  The patient presents for annual medicare wellness, complete physical and review of chronic health problems. He/She also has the following acute concerns today: back  pain  The patient saw a LPN or RN for medicare wellness visit 02/2022  Prevention and wellness was reviewed in detail. Note reviewed and important notes copied below.  Health Maintenance:   No gaps   Abnormal Screenings:   None  01/05/23   She visits with grandchildren, cooking a lot.  Chronic diarrhea improved some overtime.  Exercise: walking   frequently.  Diet: heart healthy diet.  Hypertension:    Well controlled on  losartan  100 mg daily, diltiazem CD 240 mg daily BP Readings from Last 3 Encounters:  01/05/23 134/76  07/09/21 120/70  07/02/21 (!) 140/98  Using medication without problems or lightheadedness:  Chest pain with exertion:none Edema:none Short of breath:none Average home BPs: Other issues:  Elevated Cholesterol:  Due for re-eval. She is not interested in checking at this time.. not interested in treating. Lab Results  Component Value Date   CHOL 147 07/20/2019   HDL 84.60 07/20/2019   LDLCALC 41 07/20/2019   LDLDIRECT 57.8 06/13/2021   TRIG 107.0 07/20/2019   CHOLHDL 2 07/20/2019  Using medications without problems: Muscle aches:    Vitr D def: due for re-eval.   COVID 19 screen No recent travel or known exposure to [place] The patient denies respiratory symptoms of COVID 19 at this time.  The importance of social distancing was discussed today.   Review of Systems  Constitutional:  Negative for chills and fever.  HENT:  Negative for congestion and ear pain.   Eyes:  Negative for pain and redness.  Respiratory:  Negative for cough and shortness of breath.   Cardiovascular:  Negative for chest pain, palpitations and leg swelling.  Gastrointestinal:  Negative for abdominal pain,  blood in stool, constipation, diarrhea, nausea and vomiting.  Genitourinary:  Negative for dysuria.  Musculoskeletal:  Negative for falls and myalgias.  Skin:  Negative for rash.  Neurological:  Negative for dizziness.  Psychiatric/Behavioral:  Negative for depression. The patient is not nervous/anxious.       Past Medical History:  Diagnosis Date   Allergic rhinitis, cause unspecified    Diarrhea    Edema    History of colon cancer    s/p surgery, chemotherapy   Internal hemorrhoids without mention of complication    Malignant neoplasm of rectum (HCC)    Osteoarthrosis, unspecified whether generalized or localized, unspecified site    Osteoporosis, unspecified    Other acute reactions to stress    Other and unspecified hyperlipidemia    Other screening mammogram    Pain in limb    Special screening for osteoporosis    Ulceration of vulva, unspecified    Unspecified essential hypertension     reports that she has never smoked. She has never used smokeless tobacco. She reports current drug use. Drug: Opium. She reports that she does not drink alcohol.   Current Outpatient Medications:    aspirin EC 81 MG EC tablet, Take 1 tablet (81 mg total) by mouth daily. Swallow whole., Disp: 30 tablet, Rfl: 0   colestipol (COLESTID) 1 G tablet, Take 2 g by mouth 2 (two) times daily., Disp: , Rfl:    diltiazem (CARDIZEM CD) 240 MG 24 hr capsule, Take 1 capsule (240 mg total) by mouth daily.,  Disp: 90 capsule, Rfl: 0   losartan (COZAAR) 100 MG tablet, Take 1 tablet (100 mg total) by mouth daily., Disp: 90 tablet, Rfl: 0   Opium 10 MG/ML (1%) TINC, Please take 0.2-0.4 mL three times per day, Disp: , Rfl:    Cholecalciferol (VITAMIN D PO), Take 5,000 Units by mouth daily. (Patient not taking: Reported on 01/05/2023), Disp: , Rfl:    cloNIDine (CATAPRES) 0.1 MG tablet, Take 1 tablet (0.1 mg total) by mouth daily as needed (SBP > 150). (Patient not taking: Reported on 10/06/2021), Disp: 30 tablet,  Rfl: 0   Observations/Objective: Blood pressure 140/86, pulse 94, temperature 98 F (36.7 C), temperature source Temporal, height '5\' 3"'$  (1.6 m), weight 109 lb 8 oz (49.7 kg), SpO2 96 %.  Physical Exam Constitutional:      General: She is not in acute distress.    Appearance: Normal appearance. She is well-developed. She is not ill-appearing or toxic-appearing.  HENT:     Head: Normocephalic.     Right Ear: Hearing, tympanic membrane, ear canal and external ear normal.     Left Ear: Hearing, tympanic membrane, ear canal and external ear normal.     Nose: Nose normal.  Eyes:     General: Lids are normal. Lids are everted, no foreign bodies appreciated.     Conjunctiva/sclera: Conjunctivae normal.     Pupils: Pupils are equal, round, and reactive to light.  Neck:     Thyroid: No thyroid mass or thyromegaly.     Vascular: No carotid bruit.     Trachea: Trachea normal.  Cardiovascular:     Rate and Rhythm: Normal rate and regular rhythm.     Heart sounds: Normal heart sounds, S1 normal and S2 normal. No murmur heard.    No gallop.  Pulmonary:     Effort: Pulmonary effort is normal. No respiratory distress.     Breath sounds: Normal breath sounds. No wheezing, rhonchi or rales.  Abdominal:     General: Bowel sounds are normal. There is no distension or abdominal bruit.     Palpations: Abdomen is soft. There is no fluid wave or mass.     Tenderness: There is no abdominal tenderness. There is no guarding or rebound.     Hernia: No hernia is present.  Musculoskeletal:     Cervical back: Normal range of motion and neck supple.     Lumbar back: Tenderness present. No bony tenderness. Normal range of motion.  Lymphadenopathy:     Cervical: No cervical adenopathy.  Skin:    General: Skin is warm and dry.     Findings: No rash.  Neurological:     Mental Status: She is alert.     Cranial Nerves: No cranial nerve deficit.     Sensory: No sensory deficit.  Psychiatric:        Mood and  Affect: Mood is not anxious or depressed.        Speech: Speech normal.        Behavior: Behavior normal. Behavior is cooperative.        Judgment: Judgment normal.      Assessment and Plan The patient's preventative maintenance and recommended screening tests for an annual wellness exam were reviewed in full today. Brought up to date unless services declined.  Counselled on the importance of diet, exercise, and its role in overall health and mortality. The patient's FH and SH was reviewed, including their home life, tobacco status, and drug and alcohol status.  Vaccines:declined Tdap , plans later on. Not interested in shingrix RSV Pap/DVE: not indicated Mammo: not indicated Bone Density: osteoporosis... Refuses dexa, refuses treatment other than CA, vit D and weight bearing exercise. Reviewed 2024 and pt continues to refuse. Colon: per GI. Saw last in 11/2018, paregoric Smoking Status:none ETOH/ drug [id number]   Routine general medical examination at a health care facility  Essential hypertension, benign Assessment & Plan: Stable, chronic.  Continue current medication.  losartan  100 mg daily, diltiazem CD 240 mg dialy  Orders: -     Comprehensive metabolic panel  Mixed hyperlipidemia Assessment & Plan:  Due for re-eval, but she is not interested in treating with medication and therefore does not want to check.  She has an overall heart healthy diet and is very active for an 89 year old.  I think not continuing to follow is very reasonable for this patient.   Vitamin D deficiency Assessment & Plan: Due for re-eval.  Orders: -     VITAMIN D 25 Hydroxy (Vit-D Deficiency, Fractures)  At high risk for osteoporosis, no DEXA Assessment & Plan: She continues to refuse bone density but in my opinion is at high risk for osteoporosis. Recommend weight bearing exercise, calcium in diet and vit D supplement 400 IU 1-2 times daily.       Eliezer Lofts, MD

## 2023-01-05 NOTE — Assessment & Plan Note (Addendum)
Stable, chronic.  Continue current medication.  losartan  100 mg daily, diltiazem CD 240 mg dialy

## 2023-01-05 NOTE — Assessment & Plan Note (Addendum)
Due for re-eval, but she is not interested in treating with medication and therefore does not want to check.  She has an overall heart healthy diet and is very active for an 89 year old.  I think not continuing to follow is very reasonable for this patient.

## 2023-01-05 NOTE — Telephone Encounter (Signed)
Refill sent as requested. 

## 2023-01-05 NOTE — Assessment & Plan Note (Signed)
Due for re-eval. 

## 2023-01-05 NOTE — Telephone Encounter (Signed)
Prescription Request  01/05/2023  LOV: 01/05/2023  What is the name of the medication or equipment? losartan (COZAAR) 100 MG tablet   Have you contacted your pharmacy to request a refill? No   Which pharmacy would you like this sent to?   [place], [place] - [place] [address] [place] [address] Phone: [phone] Fax: [phone]    Patient notified that their request is being sent to the clinical staff for review and that they should receive a response within 2 business days.   Please advise at [place]

## 2023-01-07 ENCOUNTER — Encounter: Payer: Self-pay | Admitting: *Deleted

## 2023-03-05 ENCOUNTER — Other Ambulatory Visit: Payer: Self-pay | Admitting: Family Medicine

## 2023-10-06 DIAGNOSIS — K529 Noninfective gastroenteritis and colitis, unspecified: Secondary | ICD-10-CM | POA: Diagnosis not present

## 2023-10-06 DIAGNOSIS — R197 Diarrhea, unspecified: Secondary | ICD-10-CM | POA: Diagnosis not present

## 2024-02-23 ENCOUNTER — Encounter: Payer: Self-pay | Admitting: Family Medicine

## 2024-02-23 ENCOUNTER — Other Ambulatory Visit: Payer: Self-pay | Admitting: Family Medicine

## 2024-02-23 NOTE — Telephone Encounter (Signed)
 Please schedule Medicare Wellness with [facility] and CPE with fasting labs prior with Dr. Ermalene [place].

## 2024-02-23 NOTE — Telephone Encounter (Signed)
 Sent letter, attempted to call unable to leave VM

## 2024-02-27 ENCOUNTER — Other Ambulatory Visit: Payer: Self-pay | Admitting: Family Medicine

## 2024-02-27 NOTE — Telephone Encounter (Signed)
 Contacted pt, unable to leave vm

## 2024-02-28 NOTE — Telephone Encounter (Signed)
 Last office visit 01/05/2023.  Chlorthalidone  is not on current medication list.  No future appointments.

## 2024-02-28 NOTE — Telephone Encounter (Signed)
 Unable to leave vm.

## 2024-05-21 DIAGNOSIS — R197 Diarrhea, unspecified: Secondary | ICD-10-CM | POA: Diagnosis not present

## 2024-05-29 ENCOUNTER — Encounter (HOSPITAL_COMMUNITY): Payer: Self-pay | Admitting: Internal Medicine

## 2024-05-29 ENCOUNTER — Emergency Department (HOSPITAL_COMMUNITY)

## 2024-05-29 ENCOUNTER — Inpatient Hospital Stay (HOSPITAL_COMMUNITY)
Admission: EM | Admit: 2024-05-29 | Discharge: 2024-05-31 | DRG: 682 | Disposition: A | Discharge status: Home / Self Care | Attending: Internal Medicine | Admitting: Internal Medicine

## 2024-05-29 DIAGNOSIS — B9620 Unspecified Escherichia coli [E. coli] as the cause of diseases classified elsewhere: Secondary | ICD-10-CM | POA: Diagnosis present

## 2024-05-29 DIAGNOSIS — I129 Hypertensive chronic kidney disease with stage 1 through stage 4 chronic kidney disease, or unspecified chronic kidney disease: Secondary | ICD-10-CM | POA: Diagnosis present

## 2024-05-29 DIAGNOSIS — N182 Chronic kidney disease, stage 2 (mild): Secondary | ICD-10-CM | POA: Diagnosis present

## 2024-05-29 DIAGNOSIS — M810 Age-related osteoporosis without current pathological fracture: Secondary | ICD-10-CM | POA: Diagnosis not present

## 2024-05-29 DIAGNOSIS — K529 Noninfective gastroenteritis and colitis, unspecified: Secondary | ICD-10-CM | POA: Diagnosis present

## 2024-05-29 DIAGNOSIS — Z8261 Family history of arthritis: Secondary | ICD-10-CM | POA: Diagnosis not present

## 2024-05-29 DIAGNOSIS — Z79899 Other long term (current) drug therapy: Secondary | ICD-10-CM | POA: Diagnosis not present

## 2024-05-29 DIAGNOSIS — Z1611 Resistance to penicillins: Secondary | ICD-10-CM | POA: Diagnosis not present

## 2024-05-29 DIAGNOSIS — R159 Full incontinence of feces: Secondary | ICD-10-CM | POA: Diagnosis present

## 2024-05-29 DIAGNOSIS — I959 Hypotension, unspecified: Secondary | ICD-10-CM | POA: Diagnosis present

## 2024-05-29 DIAGNOSIS — N189 Chronic kidney disease, unspecified: Secondary | ICD-10-CM | POA: Diagnosis not present

## 2024-05-29 DIAGNOSIS — M1990 Unspecified osteoarthritis, unspecified site: Secondary | ICD-10-CM | POA: Diagnosis present

## 2024-05-29 DIAGNOSIS — M6281 Muscle weakness (generalized): Secondary | ICD-10-CM | POA: Diagnosis present

## 2024-05-29 DIAGNOSIS — Z7982 Long term (current) use of aspirin: Secondary | ICD-10-CM

## 2024-05-29 DIAGNOSIS — N179 Acute kidney failure, unspecified: Principal | ICD-10-CM | POA: Diagnosis present

## 2024-05-29 DIAGNOSIS — Z85048 Personal history of other malignant neoplasm of rectum, rectosigmoid junction, and anus: Secondary | ICD-10-CM

## 2024-05-29 DIAGNOSIS — R54 Age-related physical debility: Secondary | ICD-10-CM | POA: Diagnosis not present

## 2024-05-29 DIAGNOSIS — R531 Weakness: Secondary | ICD-10-CM | POA: Diagnosis not present

## 2024-05-29 DIAGNOSIS — R4182 Altered mental status, unspecified: Secondary | ICD-10-CM | POA: Diagnosis present

## 2024-05-29 DIAGNOSIS — I429 Cardiomyopathy, unspecified: Secondary | ICD-10-CM | POA: Diagnosis not present

## 2024-05-29 DIAGNOSIS — Z9049 Acquired absence of other specified parts of digestive tract: Secondary | ICD-10-CM

## 2024-05-29 DIAGNOSIS — R509 Fever, unspecified: Secondary | ICD-10-CM | POA: Diagnosis not present

## 2024-05-29 DIAGNOSIS — N3000 Acute cystitis without hematuria: Principal | ICD-10-CM | POA: Diagnosis present

## 2024-05-29 DIAGNOSIS — E782 Mixed hyperlipidemia: Secondary | ICD-10-CM | POA: Diagnosis not present

## 2024-05-29 DIAGNOSIS — E7849 Other hyperlipidemia: Secondary | ICD-10-CM

## 2024-05-29 DIAGNOSIS — I10 Essential (primary) hypertension: Secondary | ICD-10-CM | POA: Diagnosis not present

## 2024-05-29 DIAGNOSIS — A4151 Sepsis due to Escherichia coli [E. coli]: Secondary | ICD-10-CM | POA: Diagnosis not present

## 2024-05-29 DIAGNOSIS — A419 Sepsis, unspecified organism: Secondary | ICD-10-CM | POA: Diagnosis not present

## 2024-05-29 DIAGNOSIS — G9341 Metabolic encephalopathy: Secondary | ICD-10-CM | POA: Diagnosis present

## 2024-05-29 DIAGNOSIS — I6782 Cerebral ischemia: Secondary | ICD-10-CM | POA: Diagnosis not present

## 2024-05-29 DIAGNOSIS — R410 Disorientation, unspecified: Secondary | ICD-10-CM | POA: Diagnosis not present

## 2024-05-29 DIAGNOSIS — J42 Unspecified chronic bronchitis: Secondary | ICD-10-CM | POA: Diagnosis not present

## 2024-05-29 DIAGNOSIS — E785 Hyperlipidemia, unspecified: Secondary | ICD-10-CM | POA: Diagnosis present

## 2024-05-29 LAB — COMPREHENSIVE METABOLIC PANEL WITH GFR
ALT: 22 U/L (ref 0–44)
AST: 17 U/L (ref 15–41)
Albumin: 2.5 g/dL — ABNORMAL LOW (ref 3.5–5.0)
Alkaline Phosphatase: 70 U/L (ref 38–126)
Anion gap: 9 (ref 5–15)
BUN: 56 mg/dL — ABNORMAL HIGH (ref 8–23)
CO2: 24 mmol/L (ref 22–32)
Calcium: 9.6 mg/dL (ref 8.9–10.3)
Chloride: 101 mmol/L (ref 98–111)
Creatinine, Ser: 2.16 mg/dL — ABNORMAL HIGH (ref 0.44–1.00)
GFR, Estimated: 21 mL/min — ABNORMAL LOW (ref >60)
Glucose, Bld: 133 mg/dL — ABNORMAL HIGH (ref 70–99)
Potassium: 4 mmol/L (ref 3.5–5.1)
Sodium: 134 mmol/L — ABNORMAL LOW (ref 135–145)
Total Bilirubin: 0.6 mg/dL (ref 0.0–1.2)
Total Protein: 5.9 g/dL — ABNORMAL LOW (ref 6.5–8.1)

## 2024-05-29 LAB — I-STAT VENOUS BLOOD GAS, ED
Acid-Base Excess: 0 mmol/L (ref 0.0–2.0)
Bicarbonate: 24.3 mmol/L (ref 20.0–28.0)
Calcium, Ion: 1.23 mmol/L (ref 1.15–1.40)
HCT: 40 % (ref 36.0–46.0)
Hemoglobin: 13.6 g/dL (ref 12.0–15.0)
O2 Saturation: 48 %
Potassium: 3.8 mmol/L (ref 3.5–5.1)
Sodium: 134 mmol/L — ABNORMAL LOW (ref 135–145)
TCO2: 25 mmol/L (ref 22–32)
pCO2, Ven: 37.6 mmHg — ABNORMAL LOW (ref 44–60)
pH, Ven: 7.418 (ref 7.25–7.43)
pO2, Ven: 26 mmHg — CL (ref 32–45)

## 2024-05-29 LAB — URINALYSIS, W/ REFLEX TO CULTURE (INFECTION SUSPECTED)
Bilirubin Urine: NEGATIVE
Glucose, UA: NEGATIVE mg/dL
Ketones, ur: NEGATIVE mg/dL
Nitrite: NEGATIVE
Protein, ur: >=300 mg/dL — AB
RBC / HPF: >50 RBC/hpf (ref 0–5)
Specific Gravity, Urine: 1.011 (ref 1.005–1.030)
WBC, UA: >50 WBC/hpf (ref 0–5)
pH: 5 (ref 5.0–8.0)

## 2024-05-29 LAB — CBC
HCT: 41.2 % (ref 36.0–46.0)
Hemoglobin: 13.7 g/dL (ref 12.0–15.0)
MCH: 31.4 pg (ref 26.0–34.0)
MCHC: 33.3 g/dL (ref 30.0–36.0)
MCV: 94.5 fL (ref 80.0–100.0)
Platelets: 524 K/uL — ABNORMAL HIGH (ref 150–400)
RBC: 4.36 MIL/uL (ref 3.87–5.11)
RDW: 14 % (ref 11.5–15.5)
WBC: 26.2 K/uL — ABNORMAL HIGH (ref 4.0–10.5)
nRBC: 0 % (ref 0.0–0.2)

## 2024-05-29 LAB — CBG MONITORING, ED: Glucose-Capillary: 136 mg/dL — ABNORMAL HIGH (ref 70–99)

## 2024-05-29 LAB — AMMONIA: Ammonia: 18 umol/L (ref 9–35)

## 2024-05-29 LAB — I-STAT CG4 LACTIC ACID, ED: Lactic Acid, Venous: 1.3 mmol/L (ref 0.5–1.9)

## 2024-05-29 LAB — ETHANOL: Alcohol, Ethyl (B): <15 mg/dL (ref <15)

## 2024-05-29 MED ORDER — ASPIRIN 81 MG PO TBEC
81.0000 mg | DELAYED_RELEASE_TABLET | Freq: Every day | ORAL | Status: DC
  Administered 2024-05-30 – 2024-05-31 (×2): 81 mg via ORAL
  Filled 2024-05-29 (×2): qty 1

## 2024-05-29 MED ORDER — SODIUM CHLORIDE 0.9% FLUSH
3.0000 mL | Freq: Two times a day (BID) | INTRAVENOUS | Status: DC
  Administered 2024-05-31: 3 mL via INTRAVENOUS

## 2024-05-29 MED ORDER — LACTATED RINGERS IV SOLN: INTRAVENOUS | Status: AC

## 2024-05-29 MED ORDER — COLESTIPOL HCL 1 G PO TABS: 2.0000 g | ORAL_TABLET | Freq: Two times a day (BID) | ORAL | Status: DC

## 2024-05-29 MED ORDER — ACETAMINOPHEN 650 MG RE SUPP: 650.0000 mg | Freq: Four times a day (QID) | RECTAL | Status: DC | PRN

## 2024-05-29 MED ORDER — COLESTIPOL HCL 1 G PO TABS
2.0000 g | ORAL_TABLET | Freq: Two times a day (BID) | ORAL | Status: DC
  Administered 2024-05-30 – 2024-05-31 (×3): 2 g via ORAL
  Filled 2024-05-29 (×4): qty 2

## 2024-05-29 MED ORDER — BISMUTH SUBSALICYLATE 262 MG/15ML PO SUSP: 30.0000 mL | ORAL | Status: DC | PRN

## 2024-05-29 MED ORDER — SODIUM CHLORIDE 0.9% FLUSH: 3.0000 mL | INTRAVENOUS | Status: DC | PRN

## 2024-05-29 MED ORDER — ENOXAPARIN SODIUM 30 MG/0.3ML IJ SOSY: 30.0000 mg | PREFILLED_SYRINGE | INTRAMUSCULAR | Status: DC

## 2024-05-29 MED ORDER — ONDANSETRON HCL 4 MG PO TABS: 4.0000 mg | ORAL_TABLET | Freq: Four times a day (QID) | ORAL | Status: DC | PRN

## 2024-05-29 MED ORDER — LACTATED RINGERS IV BOLUS
1000.0000 mL | Freq: Once | INTRAVENOUS | Status: AC
  Administered 2024-05-29: 1000 mL via INTRAVENOUS

## 2024-05-29 MED ORDER — SODIUM CHLORIDE 0.9% FLUSH: 3.0000 mL | Freq: Two times a day (BID) | INTRAVENOUS | Status: DC

## 2024-05-29 MED ORDER — SODIUM CHLORIDE 0.9 % IV SOLN
1.0000 g | Freq: Once | INTRAVENOUS | Status: AC
  Administered 2024-05-29: 1 g via INTRAVENOUS
  Filled 2024-05-29: qty 10

## 2024-05-29 MED ORDER — ONDANSETRON HCL 4 MG/2ML IJ SOLN: 4.0000 mg | Freq: Four times a day (QID) | INTRAMUSCULAR | Status: DC | PRN

## 2024-05-29 MED ORDER — HEPARIN SODIUM (PORCINE) 5000 UNIT/ML IJ SOLN
5000.0000 [IU] | Freq: Three times a day (TID) | INTRAMUSCULAR | Status: DC
  Administered 2024-05-30 – 2024-05-31 (×4): 5000 [IU] via SUBCUTANEOUS
  Filled 2024-05-29 (×4): qty 1

## 2024-05-29 MED ORDER — SODIUM CHLORIDE 0.9 % IV SOLN
1.0000 g | INTRAVENOUS | Status: DC
  Administered 2024-05-30: 1 g via INTRAVENOUS
  Filled 2024-05-29: qty 10

## 2024-05-29 MED ORDER — SODIUM CHLORIDE 0.9 % IV SOLN: 250.0000 mL | INTRAVENOUS | Status: AC | PRN

## 2024-05-29 MED ORDER — ACETAMINOPHEN 325 MG PO TABS: 650.0000 mg | ORAL_TABLET | Freq: Four times a day (QID) | ORAL | Status: DC | PRN

## 2024-05-29 NOTE — ED Triage Notes (Signed)
 Patient BIB EMS from home for AMS and fever. Patient family reports to EMS patient has become more altered and weak since Saturday and today noticed the patient had a fever. Patient has been being treated for chronic diarrhea. Patient is alerted to self during triage.

## 2024-05-29 NOTE — H&P (Signed)
 History and Physical    [name] FMW:[id number] DOB: [birth year] DOA: 05/29/2024  PCP: [name] Greig BRAVO, MD   Patient coming from: Home   Chief Complaint:  Chief Complaint  Patient presents with   Altered Mental Status   Fever   ED TRIAGE note: Patient BIB EMS from home for AMS and fever. Patient family reports to EMS patient has become more altered and weak since Saturday and today noticed the patient had a fever. Patient has been being treated for chronic diarrhea. Patient is alerted to self during triage.            HPI:  [name] is a 89 y.o. female with medical history significant of essential hypertension, mixed hyperlipidemia, vitamin D  deficiency, osteoporosis, history of rectal cancer/colon cancer status post colectomy (unknown timeline) and chronic diarrhea presented to emergency department via EMS with complaining of altered mental status, chronic diarrhea and fever.  Family reported that for last couple of days patient is seems more confused and not eating and drinking well at home.  They also noticed low-grade temperature and patient's skin felt warm.  Patient also continues to urinary incontinence.  Family stated that patient has diarrhea at the baseline however patient is having more frequent bowel movement 2-3 episodes of loose stool in a day. Patient did not have any cough, nausea, vomiting, chest pain, palpitation or shortness of breath. During my evaluation at the bedside patient is alert and oriented to self and pleasantly confused.    ED Course:  Presentation to ED patient found hypotensive otherwise hemodynamically stable. CBC showing WBC count 26 and elevated platelet 524. CMP showing low sodium 134, elevated creatinine 2.16, low albumin 2.5. Normal ammonia level. VBG showing low pCO2 26 with normal pH.  Normal lactic acid level.  UA showing evidence of UTI pending urine culture.  Normal blood glucose level.  Pending blood cultures result.  CT head  no acute intracranial abnormality. Chest x-ray no acute cardiopulmonary process.  In the ED patient received 1 L of LR bolus and and ceftriaxone  1 g for UTI  Hospitalist has been consulted for further evaluation management of acute cystitis and acute kidney injury.   Significant labs in the ED: Lab Orders         Urine Culture         Blood culture (routine x 2)         Gastrointestinal Panel by PCR , Stool         C Difficile Quick Screen w PCR reflex         Comprehensive metabolic panel         CBC         Ethanol         Ammonia         Urinalysis, w/ Reflex to Culture (Infection Suspected) -Urine, Clean Catch         Creatinine, urine, random         Sodium, urine, random         CBC         Basic metabolic panel         CBG monitoring, ED         I-Stat venous blood gas, ED         I-Stat CG4 Lactic Acid       Review of Systems:  Review of Systems  Constitutional:  Positive for fever and malaise/fatigue. Negative for chills and weight loss.  Respiratory:  Negative  for cough, sputum production and shortness of breath.   Cardiovascular:  Negative for chest pain and palpitations.  Gastrointestinal:  Positive for diarrhea. Negative for abdominal pain, blood in stool, constipation, melena, nausea and vomiting.  Genitourinary:  Positive for frequency. Negative for dysuria, flank pain, hematuria and urgency.  Musculoskeletal:  Negative for falls, joint pain and myalgias.  Neurological:  Negative for dizziness.  Psychiatric/Behavioral:  The patient is not nervous/anxious.     Past Medical History:  Diagnosis Date   Allergic rhinitis, cause unspecified    Diarrhea    Edema    History of colon cancer    s/p surgery, chemotherapy   Internal hemorrhoids without mention of complication    Malignant neoplasm of rectum (HCC)    Osteoarthrosis, unspecified whether generalized or localized, unspecified site    Osteoporosis, unspecified    Other acute reactions to stress     Other and unspecified hyperlipidemia    Other screening mammogram    Pain in limb    Special screening for osteoporosis    Ulceration of vulva, unspecified    Unspecified essential hypertension     Past Surgical History:  Procedure Laterality Date   dental implants     PARTIAL COLECTOMY  9/10   temporary colostomy     reports that she has never smoked. She has never used smokeless tobacco. She reports current drug use. Drug: Opium . She reports that she does not drink alcohol.  No Known Allergies  Family History  Problem Relation Age of Onset   Stroke Mother    Arthritis Mother    Arthritis Brother    Depression Sister     Prior to Admission medications   Medication Sig Start Date End Date Taking? Authorizing Provider  aspirin  EC 81 MG EC tablet Take 1 tablet (81 mg total) by mouth daily. Swallow whole. 06/17/21   Singh, Prashant K, MD  chlorthalidone  (HYGROTON ) 25 MG tablet Take 1 tablet (25 mg total) by mouth daily. 02/28/24   Bedsole, Amy E, MD  colestipol  (COLESTID ) 1 G tablet Take 2 g by mouth 2 (two) times daily.    [provider]  diltiazem  (CARDIZEM  CD) 240 MG 24 hr capsule Take 1 capsule (240 mg total) by mouth daily. 03/07/23   Bedsole, Amy E, MD  losartan  (COZAAR ) 100 MG tablet Take 1 tablet (100 mg total) by mouth daily. 02/23/24   [name] Greig BRAVO, MD  Opium  10 MG/ML (1%) TINC Please take 0.2-0.4 mL three times per day 12/25/18   [provider]     Physical Exam: Vitals:   05/29/24 2200 05/29/24 2300 05/29/24 2356 05/30/24 0015  BP: 97/71  (!) 102/49 (!) 106/47  Pulse: 67  64 66  Resp: 15  14 16   Temp:    98.2 F (36.8 C)  TempSrc:    Oral  SpO2: 95%  99% 97%  Weight:  43.9 kg    Height:  5' 4 (1.626 m)      Physical Exam Constitutional:      Appearance: Normal appearance.     Comments: Patient is alert and oriented to self.  Pleasantly confused  HENT:     Head: Normocephalic.     Mouth/Throat:     Mouth: Mucous membranes are moist.   Eyes:     Pupils: Pupils are equal, round, and reactive to light.  Cardiovascular:     Rate and Rhythm: Normal rate and regular rhythm.     Pulses: Normal pulses.  Heart sounds: Normal heart sounds.  Pulmonary:     Effort: Pulmonary effort is normal.     Breath sounds: Normal breath sounds.  Abdominal:     Palpations: Abdomen is soft.  Musculoskeletal:     Cervical back: Neck supple.     Right lower leg: No edema.     Left lower leg: No edema.  Skin:    General: Skin is dry.     Capillary Refill: Capillary refill takes less than 2 seconds.  Neurological:     Mental Status: She is alert.     Comments: Alert and oriented to self  Psychiatric:     Comments: Unable to assess      Labs on Admission: I have personally reviewed following labs and imaging studies  CBC: Recent Labs  Lab 05/29/24 2105 05/29/24 2110  WBC 26.2*  --   HGB 13.7 13.6  HCT 41.2 40.0  MCV 94.5  --   PLT 524*  --    Basic Metabolic Panel: Recent Labs  Lab 05/29/24 2105 05/29/24 2110  NA 134* 134*  K 4.0 3.8  CL 101  --   CO2 24  --   GLUCOSE 133*  --   BUN 56*  --   CREATININE 2.16*  --   CALCIUM 9.6  --    GFR: Estimated Creatinine Clearance: 12.2 mL/min (A) (by C-G formula based on SCr of 2.16 mg/dL (H)). Liver Function Tests: Recent Labs  Lab 05/29/24 2105  AST 17  ALT 22  ALKPHOS 70  BILITOT 0.6  PROT 5.9*  ALBUMIN 2.5*   No results for input(s): LIPASE, AMYLASE in the last 168 hours. Recent Labs  Lab 05/29/24 2105  AMMONIA 18   Coagulation Profile: No results for input(s): INR, PROTIME in the last 168 hours. Cardiac Enzymes: No results for input(s): CKTOTAL, CKMB, CKMBINDEX, TROPONINI, TROPONINIHS in the last 168 hours. BNP (last 3 results) No results for input(s): BNP in the last 8760 hours. HbA1C: No results for input(s): HGBA1C in the last 72 hours. CBG: Recent Labs  Lab 05/29/24 2149  GLUCAP 136*   Lipid Profile: No results for  input(s): CHOL, HDL, LDLCALC, TRIG, CHOLHDL, LDLDIRECT in the last 72 hours. Thyroid  Function Tests: No results for input(s): TSH, T4TOTAL, FREET4, T3FREE, THYROIDAB in the last 72 hours. Anemia Panel: No results for input(s): VITAMINB12, FOLATE, FERRITIN, TIBC, IRON, RETICCTPCT in the last 72 hours. Urine analysis:    Component Value Date/Time   COLORURINE YELLOW 05/29/2024 2231   APPEARANCEUR TURBID (A) 05/29/2024 2231   APPEARANCEUR Hazy 03/31/2013 2230   LABSPEC 1.011 05/29/2024 2231   LABSPEC 1.019 03/31/2013 2230   PHURINE 5.0 05/29/2024 2231   GLUCOSEU NEGATIVE 05/29/2024 2231   GLUCOSEU Negative 03/31/2013 2230   HGBUR MODERATE (A) 05/29/2024 2231   HGBUR negative 06/15/2010 1527   BILIRUBINUR NEGATIVE 05/29/2024 2231   BILIRUBINUR Negative 01/24/2019 1216   BILIRUBINUR Negative 03/31/2013 2230   KETONESUR NEGATIVE 05/29/2024 2231   PROTEINUR >=300 (A) 05/29/2024 2231   UROBILINOGEN 0.2 01/24/2019 1216   UROBILINOGEN 0.2 06/15/2010 1527   NITRITE NEGATIVE 05/29/2024 2231   LEUKOCYTESUR LARGE (A) 05/29/2024 2231   LEUKOCYTESUR 2+ 03/31/2013 2230    Radiological Exams on Admission: I have personally reviewed images CT HEAD WO CONTRAST Result Date: 05/29/2024 EXAM: CT HEAD WITHOUT CONTRAST 05/29/2024 09:25:54 PM TECHNIQUE: CT of the head was performed without the administration of intravenous contrast. Automated exposure control, iterative reconstruction, and/or weight based adjustment of the mA/kV was utilized  to reduce the radiation dose to as low as reasonably achievable. COMPARISON: Comparison with CT dated 06/03/2021. CLINICAL HISTORY: Mental status change, unknown cause. FINDINGS: BRAIN AND VENTRICLES: Chronic microvascular ischemia and generalized atrophy. No acute hemorrhage. Gray-white differentiation is preserved. No hydrocephalus. No extra-axial collection. No mass effect or midline shift. ORBITS: No acute abnormality. SINUSES: No acute  abnormality. SOFT TISSUES AND SKULL: Ill-defined lucency in the anterior right frontal bone, which was better evaluated on MRI in 05/2021 and has not significantly changed. No acute soft tissue abnormality. No skull fracture. IMPRESSION: 1. No acute intracranial abnormality. Electronically signed by: Norman Gatlin MD 05/29/2024 09:43 PM EDT RP Workstation: [id number]   DG Chest Portable 1 View Result Date: 05/29/2024 EXAM: 1 VIEW(S) XRAY OF THE CHEST 05/29/2024 09:23:00 PM COMPARISON: None available. CLINICAL HISTORY: Weakness. Patient has been being treated for chronic diarrhea. Patient has become more altered and weak since Saturday and today noticed the patient had a fever. FINDINGS: LUNGS AND PLEURA: Chronic bronchitic change. No focal consolidation. No pleural effusion. No pneumothorax. HEART AND MEDIASTINUM: Stable cardiomyopathy. BONES AND SOFT TISSUES: Aortic atherosclerotic calcification. IMPRESSION: 1. No acute cardiopulmonary pathology. 2. Chronic bronchitic change. Electronically signed by: Norman Gatlin MD 05/29/2024 09:39 PM EDT RP Workstation: [id number]     EKG: My personal interpretation of EKG shows: EKG showed normal sinus rhythm heart rate 75.  No ST-T abnormality.  Normal PR and QTc interval.   Assessment/Plan: Principal Problem:   Acute renal failure superimposed on stage 2 chronic kidney disease (HCC) Active Problems:   Chronic diarrhea   Acute cystitis   HLD (hyperlipidemia)   Essential hypertension, benign   Altered mental state   Acute kidney injury superimposed on chronic kidney disease (HCC)    Assessment and Plan: Acute kidney injury superimposed CKD stage II -Present emergency department echo pending by family stating that for last several days patient is experiencing lethargy and weakness as well as patient is more confused.  Family reported that patient has chronic diarrhea which has been progressively getting worse and patient is complaining about some anal  incontinence and dysuria.  Family reported patient felt warm but did not check temperature at home.  Patient also has poor oral intake.  In the ED patient is alert to self only.  Borderline hypotensive otherwise hemodynamically stable - Further workup revealed acute cystitis and acute kidney injury.  Does not meet sepsis criteria. - Elevated creatinine 2.16.-Prerenal acute kidney injury in the setting of persistent diarrhea with associated poor oral intake. - In the ED patient received 1 L of LR bolus.  Continue maintenance fluid LR 150 cc/h. - Obtaining bladder scan and continue In-N-Out cath if needed -Continue to monitor improvement of renal function, avoid nephrotoxic agent and renally adjust medications.  Acute cystitis -Family complaining about patient has recurrent urinary incontinence and feeling warmth at home.  UA showing evidence of UTI.  Normal lactic acid level.  Pending urine culture.  Patient does not meet sepsis criteria at this time.  Blood cultures has been obtained initially. - In the ED patient has been given ceftriaxone  1 g. -Continue IV ceftriaxone  1 g, need to follow-up with blood and urine culture result.  Continue maintenance fluid LR 150 cc/h.  Acute on chronic diarrhea Family reported patient has chronic diarrhea however it has been progressively getting worse.  Patient takes colestipol  twice daily with meals.   -Checking GI and C. difficile panel to rule out any infectious process. - Continue conservative management.  Essential hypertension -Holding  home blood pressure regimens in the setting of hypotension.  Altered mental status in the setting of UTI, AKI and diarrhea Family reported patient is more confused and altered for last few days. In the ED patient is alert and oriented to self only.  Altered mental status multifactorial.  Continue to treat for UTI, AKI and diarrhea.  Monitor improvement of mental status.   DVT prophylaxis:  Lovenox  Code Status:  Full  Code by default. Diet: Heart healthy diet Disposition Plan: Continue to monitor improvement of renal function. Consults:   None indicated at this time Admission status:   Inpatient, Telemetry bed  Severity of Illness: The appropriate patient status for this patient is INPATIENT. Inpatient status is judged to be reasonable and necessary in order to provide the required intensity of service to ensure the patient's safety. The patient's presenting symptoms, physical exam findings, and initial radiographic and laboratory data in the context of their chronic comorbidities is felt to place them at high risk for further clinical deterioration. Furthermore, it is not anticipated that the patient will be medically stable for discharge from the hospital within 2 midnights of admission.   * I certify that at the point of admission it is my clinical judgment that the patient will require inpatient hospital care spanning beyond 2 midnights from the point of admission due to high intensity of service, high risk for further deterioration and high frequency of surveillance required.DEWAINE    Jerson Furukawa, MD [facility]  How to contact the [facility] Attending or Consulting provider 7A - 7P or covering provider during after hours 7P -7A, for this patient.  Check the care team in [facility] and look for a) attending/consulting [facility] provider listed and b) the [facility] team listed Log into [email or web address] and use [place]'s universal password to access. If you do not have the password, please contact the hospital operator. Locate the [facility] provider you are looking for under [facility] and page to a number that you can be directly reached. If you still have difficulty reaching the provider, please page the [facility] (Director on Call) for the Hospitalists listed on amion for assistance.  05/30/2024, 3:16 AM

## 2024-05-29 NOTE — ED Provider Notes (Signed)
 [place] EMERGENCY DEPARTMENT AT [facility] Provider Note   CSN: [id number] Arrival date & time: 05/29/24  2041     Patient presents with: Altered Mental Status and Fever   [name] is a 89 y.o. female.    Altered Mental Status Associated symptoms: fever   Fever    Patient has a history of osteoporosis rectal cancer, osteoarthritis.  Patient presented to the ED for evaluation of possible fever confusion.  Family states over the last several days patient has had trouble with increasing lethargy and weakness.  She has been more confused.  They have also felt that she has been warm at times.  They did not measure a fever at home but was concerned that she was having 1.  Patient in the ED denies any specific complaints.  She states she is not having any chest pain or abdominal pain.  She is not having any shortness of breath.  Prior to Admission medications   Medication Sig Start Date End Date Taking? Authorizing Provider  aspirin  EC 81 MG EC tablet Take 1 tablet (81 mg total) by mouth daily. Swallow whole. 06/17/21   Singh, Prashant K, MD  chlorthalidone  (HYGROTON ) 25 MG tablet Take 1 tablet (25 mg total) by mouth daily. 02/28/24   Bedsole, Amy E, MD  colestipol  (COLESTID ) 1 G tablet Take 2 g by mouth 2 (two) times daily.    [provider]  diltiazem  (CARDIZEM  CD) 240 MG 24 hr capsule Take 1 capsule (240 mg total) by mouth daily. 03/07/23   Bedsole, Amy E, MD  losartan  (COZAAR ) 100 MG tablet Take 1 tablet (100 mg total) by mouth daily. 02/23/24   [name] Greig BRAVO, MD  Opium  10 MG/ML (1%) TINC Please take 0.2-0.4 mL three times per day 12/25/18   [provider]    Allergies: Patient has no known allergies.    Review of Systems  Constitutional:  Positive for fever.    Updated Vital Signs BP 97/71   Pulse 67   Temp 98.3 F (36.8 C) (Oral)   Resp 15   SpO2 95%   Physical Exam Vitals and nursing note reviewed.  Constitutional:      Appearance: She  is well-developed. She is not diaphoretic.  HENT:     Head: Normocephalic and atraumatic.     Right Ear: External ear normal.     Left Ear: External ear normal.  Eyes:     General: No scleral icterus.       Right eye: No discharge.        Left eye: No discharge.     Conjunctiva/sclera: Conjunctivae normal.  Neck:     Trachea: No tracheal deviation.  Cardiovascular:     Rate and Rhythm: Normal rate.  Pulmonary:     Effort: Pulmonary effort is normal. No respiratory distress.     Breath sounds: No stridor.  Abdominal:     General: There is no distension.  Musculoskeletal:        General: No swelling or deformity.     Cervical back: Neck supple.  Skin:    General: Skin is warm and dry.     Findings: No rash.  Neurological:     Mental Status: She is alert.     Cranial Nerves: No cranial nerve deficit, dysarthria or facial asymmetry.     Sensory: No sensory deficit.     Motor: Weakness present. No seizure activity.     Comments: Patient is able to lift  both arms and legs off the bed, no facial droop, patient is very slow to answer questions, at times does not immediately answer questions but responds to loud voice     (all labs ordered are listed, but only abnormal results are displayed) Labs Reviewed  COMPREHENSIVE METABOLIC PANEL WITH GFR - Abnormal; Notable for the following components:      Result Value   Sodium 134 (*)    Glucose, Bld 133 (*)    BUN 56 (*)    Creatinine, Ser 2.16 (*)    Total Protein 5.9 (*)    Albumin 2.5 (*)    GFR, Estimated 21 (*)    All other components within normal limits  CBC - Abnormal; Notable for the following components:   WBC 26.2 (*)    Platelets 524 (*)    All other components within normal limits  URINALYSIS, W/ REFLEX TO CULTURE (INFECTION SUSPECTED) - Abnormal; Notable for the following components:   APPearance TURBID (*)    Hgb urine dipstick MODERATE (*)    Protein, ur >=300 (*)    Leukocytes,Ua LARGE (*)    Bacteria, UA MANY  (*)    All other components within normal limits  CBG MONITORING, ED - Abnormal; Notable for the following components:   Glucose-Capillary 136 (*)    All other components within normal limits  I-STAT VENOUS BLOOD GAS, ED - Abnormal; Notable for the following components:   pCO2, Ven 37.6 (*)    pO2, Ven 26 (*)    Sodium 134 (*)    All other components within normal limits  URINE CULTURE  ETHANOL  AMMONIA  I-STAT CG4 LACTIC ACID, ED  I-STAT CG4 LACTIC ACID, ED    EKG: EKG Interpretation Date/Time:  Tuesday May 29 2024 20:46:54 EDT Ventricular Rate:  75 PR Interval:  166 QRS Duration:  87 QT Interval:  345 QTC Calculation: 386 R Axis:   29  Text Interpretation: Sinus rhythm Probable anteroseptal infarct, old No significant change since last tracing Confirmed by Randol Simmonds [phone]) on 05/29/2024 8:54:43 PM  Radiology: CT HEAD WO CONTRAST Result Date: 05/29/2024 EXAM: CT HEAD WITHOUT CONTRAST 05/29/2024 09:25:54 PM TECHNIQUE: CT of the head was performed without the administration of intravenous contrast. Automated exposure control, iterative reconstruction, and/or weight based adjustment of the mA/kV was utilized to reduce the radiation dose to as low as reasonably achievable. COMPARISON: Comparison with CT dated 06/03/2021. CLINICAL HISTORY: Mental status change, unknown cause. FINDINGS: BRAIN AND VENTRICLES: Chronic microvascular ischemia and generalized atrophy. No acute hemorrhage. Gray-white differentiation is preserved. No hydrocephalus. No extra-axial collection. No mass effect or midline shift. ORBITS: No acute abnormality. SINUSES: No acute abnormality. SOFT TISSUES AND SKULL: Ill-defined lucency in the anterior right frontal bone, which was better evaluated on MRI in 05/2021 and has not significantly changed. No acute soft tissue abnormality. No skull fracture. IMPRESSION: 1. No acute intracranial abnormality. Electronically signed by: Norman Gatlin MD 05/29/2024 09:43 PM EDT RP  Workstation: [id number]   DG Chest Portable 1 View Result Date: 05/29/2024 EXAM: 1 VIEW(S) XRAY OF THE CHEST 05/29/2024 09:23:00 PM COMPARISON: None available. CLINICAL HISTORY: Weakness. Patient has been being treated for chronic diarrhea. Patient has become more altered and weak since Saturday and today noticed the patient had a fever. FINDINGS: LUNGS AND PLEURA: Chronic bronchitic change. No focal consolidation. No pleural effusion. No pneumothorax. HEART AND MEDIASTINUM: Stable cardiomyopathy. BONES AND SOFT TISSUES: Aortic atherosclerotic calcification. IMPRESSION: 1. No acute cardiopulmonary pathology. 2. Chronic bronchitic change. Electronically  signed by: Norman Gatlin MD 05/29/2024 09:39 PM EDT RP Workstation: [id number]     .Critical Care  Performed by: Randol Simmonds, MD Authorized by: Randol Simmonds, MD   Critical care provider statement:    Critical care time (minutes):  30   Critical care was time spent personally by me on the following activities:  Development of treatment plan with patient or surrogate, discussions with consultants, evaluation of patient's response to treatment, examination of patient, ordering and review of laboratory studies, ordering and review of radiographic studies, ordering and performing treatments and interventions, pulse oximetry, re-evaluation of patient's condition and review of old charts    Medications Ordered in the ED  cefTRIAXone  (ROCEPHIN ) 1 g in sodium chloride  0.9 % 100 mL IVPB (has no administration in time range)  lactated ringers  bolus 1,000 mL (1,000 mLs Intravenous New Bag/Given 05/29/24 2232)    Clinical Course as of 05/29/24 2334  Tue May 29, 2024  2200 I-Stat CG4 Lactic Acid Lactic acid level normal.  No acidosis noted. [JK]  2201 Comprehensive metabolic panel(!) Creatinine elevated compared to previous. [JK]  2201 CBC(!) White blood cell count significantly elevated [JK]  2206 Checks x-ray and head CT without acute abnormality [JK]   2255 Urinalysis, w/ Reflex to Culture (Infection Suspected) -Urine, Clean Catch(!) Urinalysis is consistent with urinary tract infection [JK]  2327 Case discussed with Dr Lee [JK]    Clinical Course User Index [JK] Randol Simmonds, MD                                 Medical Decision Making Problems Addressed: Acute cystitis without hematuria: acute illness or injury that poses a threat to life or bodily functions  Amount and/or Complexity of Data Reviewed Labs: ordered. Decision-making details documented in ED Course. Radiology: ordered and independent interpretation performed.  Risk Decision regarding hospitalization.   Patient presented to the ED for evaluation of lethargy confusion.  Family did notice that she been having some urinary incontinence which was unusual for her.  Patient herself denied any specific complaints.  She is alert and answering questions although somewhat confused.  Patient's head CT did not show any acute abnormalities.  Chest x-ray without pneumonia.  Labs did show an acute kidney injury with elevated BUN and creatinine.  No signs of any lactic acidosis but patient does have significant leukocytosis.  Urinalysis consistent with UTI.  I suspect this is the etiology for her confusion and weakness.  Blood cultures and urine cultures have been ordered.  Patient has been started on IV antibiotics and IV fluids.  Will bladder scan to rule out any signs of bladder outlet obstruction.  Plan on admission to hospital for further treatment     Final diagnoses:  Acute cystitis without hematuria    ED Discharge Orders     None          Randol Simmonds, MD 05/29/24 2334

## 2024-05-30 ENCOUNTER — Telehealth: Payer: Self-pay

## 2024-05-30 ENCOUNTER — Ambulatory Visit: Admitting: Family Medicine

## 2024-05-30 DIAGNOSIS — N179 Acute kidney failure, unspecified: Secondary | ICD-10-CM | POA: Diagnosis not present

## 2024-05-30 DIAGNOSIS — N3000 Acute cystitis without hematuria: Secondary | ICD-10-CM | POA: Diagnosis not present

## 2024-05-30 DIAGNOSIS — N189 Chronic kidney disease, unspecified: Secondary | ICD-10-CM

## 2024-05-30 DIAGNOSIS — K529 Noninfective gastroenteritis and colitis, unspecified: Secondary | ICD-10-CM | POA: Diagnosis not present

## 2024-05-30 DIAGNOSIS — A419 Sepsis, unspecified organism: Secondary | ICD-10-CM | POA: Diagnosis not present

## 2024-05-30 DIAGNOSIS — G9341 Metabolic encephalopathy: Secondary | ICD-10-CM

## 2024-05-30 LAB — CBC
HCT: 35.6 % — ABNORMAL LOW (ref 36.0–46.0)
Hemoglobin: 11.9 g/dL — ABNORMAL LOW (ref 12.0–15.0)
MCH: 30.7 pg (ref 26.0–34.0)
MCHC: 33.4 g/dL (ref 30.0–36.0)
MCV: 92 fL (ref 80.0–100.0)
Platelets: 447 K/uL — ABNORMAL HIGH (ref 150–400)
RBC: 3.87 MIL/uL (ref 3.87–5.11)
RDW: 14.1 % (ref 11.5–15.5)
WBC: 22.7 K/uL — ABNORMAL HIGH (ref 4.0–10.5)
nRBC: 0 % (ref 0.0–0.2)

## 2024-05-30 LAB — BASIC METABOLIC PANEL WITH GFR
Anion gap: 9 (ref 5–15)
BUN: 52 mg/dL — ABNORMAL HIGH (ref 8–23)
CO2: 21 mmol/L — ABNORMAL LOW (ref 22–32)
Calcium: 9.2 mg/dL (ref 8.9–10.3)
Chloride: 104 mmol/L (ref 98–111)
Creatinine, Ser: 1.85 mg/dL — ABNORMAL HIGH (ref 0.44–1.00)
GFR, Estimated: 26 mL/min — ABNORMAL LOW (ref >60)
Glucose, Bld: 130 mg/dL — ABNORMAL HIGH (ref 70–99)
Potassium: 3.7 mmol/L (ref 3.5–5.1)
Sodium: 134 mmol/L — ABNORMAL LOW (ref 135–145)

## 2024-05-30 NOTE — TOC Initial Note (Signed)
 Transition of Care [facility]) - Initial/Assessment Note    Patient Details  Name: [name] MRN: [id number] Date of Birth: [birth year]  Transition of Care [facility]) CM/SW Contact:    Jeoffrey LITTIE Moose, LCSW Phone Number: 05/30/2024, 8:46 AM  Clinical Narrative:                 Pt admitted from home for AMS and fever. No current TOC needs, please consult as needs arise.         Patient Goals and CMS Choice            Expected Discharge Plan and Services                                              Prior Living Arrangements/Services                       Activities of Daily Living      Permission Sought/Granted                  Emotional Assessment              Admission diagnosis:  Acute cystitis without hematuria [N30.00] Acute kidney injury superimposed on chronic kidney disease (HCC) [N17.9, N18.9] Patient Active Problem List   Diagnosis Date Noted   Acute cystitis 05/29/2024   Acute renal failure superimposed on stage 2 chronic kidney disease (HCC) 05/29/2024   Altered mental state 05/29/2024   Acute kidney injury superimposed on chronic kidney disease (HCC) 05/29/2024   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 05/2021 07/01/2021   Lesion of right frontal lobe of brain 07/01/2021   History of stroke involving cerebellum    Right-sided low back pain without sciatica 01/25/2019   Vitamin D  deficiency 12/12/2012   Chronic diarrhea 10/27/2009   History of rectal cancer 07/23/2009   ALLERGIC RHINITIS 01/16/2009   HEMORRHOIDS, INTERNAL 11/20/2008   ANXIETY, SITUATIONAL 05/31/2007   HLD (hyperlipidemia) 05/30/2007   Essential hypertension, benign 05/30/2007   Osteoarthritis of knees, bilateral 05/30/2007   At high risk for osteoporosis, no DEXA 05/30/2007   PCP:  [name] Greig BRAVO, MD Pharmacy:   [facility] - [place], [place] - [address] [place] [address] Phone: [phone] Fax:  [phone]     Social Drivers of Health (SDOH) Social History: SDOH Screenings   Food Insecurity: No Food Insecurity (03/17/2022)  Housing: Low Risk  (07/08/2020)  Transportation Needs: No Transportation Needs (03/17/2022)  Alcohol Screen: Low Risk  (07/08/2020)  Depression (PHQ2-9): Low Risk  (01/05/2023)  Financial Resource Strain: Low Risk  (03/17/2022)  Physical Activity: Inactive (03/17/2022)  Social Connections: Unknown (02/22/2022)   Received from [facility]  Stress: No Stress Concern Present (03/17/2022)  Tobacco Use: Low Risk  (05/29/2024)   SDOH Interventions:     Readmission Risk Interventions     No data to display

## 2024-05-30 NOTE — Progress Notes (Signed)
 Progress Note   Patient: [name] FMW:[id number] DOB: 02-Jan-1933 DOA: 05/29/2024  DOS: the patient was seen and examined on 05/30/2024   Brief hospital course:  89 y.o. female with medical history significant of essential hypertension, mixed hyperlipidemia, vitamin D  deficiency, osteoporosis, history of rectal cancer/colon cancer status post colectomy (unknown timeline) and chronic diarrhea presented to emergency department via EMS with complaining of altered mental status, chronic diarrhea and fever.   Assessment and Plan:  Concern for sepsis - Profound leukocytosis, encephalopathy, UTI on presentation given concern for sepsis.  Blood cultures, fluid bolus, empiric antibiotics on board.  Monitor blood pressure closely.  Acute metabolic encephalopathy - Confusion and decreased eating and drinking prior to admission.  Appears to be showing improvement this morning.  Patient more alert, talkative.  Continue to monitor closely.  Unclear if any underlying dementia.  Acute cystitis without hematuria - UA showing LE, WBCs, bacteria.  Empiric ceftriaxone  on board.  Monitor urine cultures.  Acute kidney injury on CKD 2 - Serum creatinine markedly above baseline.  Showing improvement this morning after IV fluid hydration.  Continue to monitor urine output recheck BMP in magnesium in AM.  Acute on chronic diarrhea - Patient not complaining of any loose stools however family states that she is sad frequent loose bowel movements for the last few days.  Will continue to monitor closely, stool studies if diarrhea returns.  Hypertension - Holding antihypertensives for now.  Physical debilitation muscle weakness - Will order PT/OT consult.  Patient appears very frail.  Subjective: Patient resting comfortably this morning.  Seems more alert, talkative.  Denies any fever, chills, chest pain, nausea, vomiting, abdominal pain, diarrhea.  Physical Exam:  Vitals:   05/29/24 2356 05/30/24 0015  05/30/24 0507 05/30/24 0805  BP: (!) 102/49 (!) 106/47 (!) 116/48 (!) 128/46  Pulse: 64 66 66 76  Resp: 14 16 18 17   Temp:  98.2 F (36.8 C) 97.6 F (36.4 C) (!) 97.1 F (36.2 C)  TempSrc:  Oral Oral   SpO2: 99% 97% 97% 95%  Weight:      Height:        GENERAL:  Alert, pleasant, no acute distress, frail HEENT:  EOMI CARDIOVASCULAR:  RRR, no murmurs appreciated RESPIRATORY:  Clear to auscultation, no wheezing, rales, or rhonchi GASTROINTESTINAL:  Soft, nontender, nondistended EXTREMITIES:  No LE edema bilaterally NEURO:  No new focal deficits appreciated SKIN:  No rashes noted PSYCH:  Appropriate mood and affect     Data Reviewed:  Imaging Studies: CT HEAD WO CONTRAST Result Date: 05/29/2024 EXAM: CT HEAD WITHOUT CONTRAST 05/29/2024 09:25:54 PM TECHNIQUE: CT of the head was performed without the administration of intravenous contrast. Automated exposure control, iterative reconstruction, and/or weight based adjustment of the mA/kV was utilized to reduce the radiation dose to as low as reasonably achievable. COMPARISON: Comparison with CT dated 06/03/2021. CLINICAL HISTORY: Mental status change, unknown cause. FINDINGS: BRAIN AND VENTRICLES: Chronic microvascular ischemia and generalized atrophy. No acute hemorrhage. Gray-white differentiation is preserved. No hydrocephalus. No extra-axial collection. No mass effect or midline shift. ORBITS: No acute abnormality. SINUSES: No acute abnormality. SOFT TISSUES AND SKULL: Ill-defined lucency in the anterior right frontal bone, which was better evaluated on MRI in 05/2021 and has not significantly changed. No acute soft tissue abnormality. No skull fracture. IMPRESSION: 1. No acute intracranial abnormality. Electronically signed by: Norman Gatlin MD 05/29/2024 09:43 PM EDT RP Workstation: [id number]   DG Chest Portable 1 View Result Date: 05/29/2024 EXAM: 1 VIEW(S) XRAY  OF THE CHEST 05/29/2024 09:23:00 PM COMPARISON: None available. CLINICAL  HISTORY: Weakness. Patient has been being treated for chronic diarrhea. Patient has become more altered and weak since Saturday and today noticed the patient had a fever. FINDINGS: LUNGS AND PLEURA: Chronic bronchitic change. No focal consolidation. No pleural effusion. No pneumothorax. HEART AND MEDIASTINUM: Stable cardiomyopathy. BONES AND SOFT TISSUES: Aortic atherosclerotic calcification. IMPRESSION: 1. No acute cardiopulmonary pathology. 2. Chronic bronchitic change. Electronically signed by: Norman Gatlin MD 05/29/2024 09:39 PM EDT RP Workstation: [id number]    There are no new results to review at this time.  Previous records (including but not limited to H&P, progress notes, nursing notes, TOC management) were reviewed in assessment of this patient.  Labs: CBC: Recent Labs  Lab 05/29/24 2105 05/29/24 2110 05/30/24 0716  WBC 26.2*  --  22.7*  HGB 13.7 13.6 11.9*  HCT 41.2 40.0 35.6*  MCV 94.5  --  92.0  PLT 524*  --  447*   Basic Metabolic Panel: Recent Labs  Lab 05/29/24 2105 05/29/24 2110 05/30/24 0716  NA 134* 134* 134*  K 4.0 3.8 3.7  CL 101  --  104  CO2 24  --  21*  GLUCOSE 133*  --  130*  BUN 56*  --  52*  CREATININE 2.16*  --  1.85*  CALCIUM 9.6  --  9.2   Liver Function Tests: Recent Labs  Lab 05/29/24 2105  AST 17  ALT 22  ALKPHOS 70  BILITOT 0.6  PROT 5.9*  ALBUMIN 2.5*   CBG: Recent Labs  Lab 05/29/24 2149  GLUCAP 136*    Scheduled Meds:  aspirin  EC  81 mg Oral Daily   colestipol   2 g Oral BID WC   heparin  injection (subcutaneous)  5,000 Units Subcutaneous Q8H   sodium chloride  flush  3 mL Intravenous Q12H   sodium chloride  flush  3 mL Intravenous Q12H   Continuous Infusions:  sodium chloride      cefTRIAXone  (ROCEPHIN )  IV     lactated ringers  150 mL/hr at 05/30/24 0515   PRN Meds:.sodium chloride , acetaminophen  **OR** acetaminophen , bismuth  subsalicylate, ondansetron  **OR** ondansetron  (ZOFRAN ) IV, sodium chloride  flush  Family  Communication: None at bedside  Disposition: Status is: Inpatient Remains inpatient appropriate because: Sepsis, UTI     Time spent: 38 minutes  Length of inpatient stay: 1 days  Author: Carliss LELON Canales, DO 05/30/2024 12:49 PM  For on call review [email or web address].

## 2024-05-30 NOTE — Telephone Encounter (Signed)
 Noted

## 2024-05-30 NOTE — Plan of Care (Signed)
   Problem: Safety: Goal: Ability to remain free from injury will improve Outcome: Progressing   Problem: Skin Integrity: Goal: Risk for impaired skin integrity will decrease Outcome: Progressing

## 2024-05-30 NOTE — Plan of Care (Signed)
   Problem: Activity: Goal: Risk for activity intolerance will decrease Outcome: Progressing   Problem: Pain Managment: Goal: General experience of comfort will improve and/or be controlled Outcome: Progressing   Problem: Safety: Goal: Ability to remain free from injury will improve Outcome: Progressing

## 2024-05-30 NOTE — Hospital Course (Signed)
 89 y.o. female with medical history significant of essential hypertension, mixed hyperlipidemia, vitamin D  deficiency, osteoporosis, history of rectal cancer/colon cancer status post colectomy (unknown timeline) and chronic diarrhea presented to emergency department via EMS with complaining of altered mental status, chronic diarrhea and fever.    Assessment and Plan:   Concern for sepsis - Profound leukocytosis, encephalopathy, UTI on presentation given concern for sepsis.  Blood cultures, fluid bolus, empiric antibiotics on board.  Monitor blood pressure closely.   Acute metabolic encephalopathy - Confusion and decreased eating and drinking prior to admission.  Appears to be showing improvement this morning.  Patient more alert, talkative.  Continue to monitor closely.  Unclear if any underlying dementia.   Acute cystitis without hematuria - UA showing LE, WBCs, bacteria.  Empiric ceftriaxone  on board.  Monitor urine cultures.   Acute kidney injury on CKD 2 - Serum creatinine markedly above baseline.  Showing improvement this morning after IV fluid hydration.  Continue to monitor urine output recheck BMP in magnesium in AM.   Acute on chronic diarrhea - Patient not complaining of any loose stools however family states that she is sad frequent loose bowel movements for the last few days.  Will continue to monitor closely, stool studies if diarrhea returns.   Hypertension - Holding antihypertensives for now.   Physical debilitation muscle weakness - Will order PT/OT consult.  Patient appears very frail.

## 2024-05-30 NOTE — Progress Notes (Signed)
 Patient arrived in the unit accompanied by tech and RN via bed.

## 2024-05-30 NOTE — Telephone Encounter (Signed)
 Copied from CRM #[id number]. Topic: General - Other >> May 30, 2024 11:07 AM Lavanda D wrote: Reason for CRM: Patient's daughter wanted to advise that [name] was admitted to the hospital last for a UTI. There is no time set for discharge yet but she is at [facility]. She just wanted to advise Dr. [name].

## 2024-05-31 DIAGNOSIS — N179 Acute kidney failure, unspecified: Secondary | ICD-10-CM | POA: Diagnosis not present

## 2024-05-31 DIAGNOSIS — A4151 Sepsis due to Escherichia coli [E. coli]: Secondary | ICD-10-CM | POA: Diagnosis not present

## 2024-05-31 DIAGNOSIS — N3000 Acute cystitis without hematuria: Secondary | ICD-10-CM | POA: Diagnosis not present

## 2024-05-31 DIAGNOSIS — G9341 Metabolic encephalopathy: Secondary | ICD-10-CM | POA: Diagnosis not present

## 2024-05-31 LAB — URINE CULTURE: Culture: >=100000 — AB

## 2024-05-31 LAB — BASIC METABOLIC PANEL WITH GFR
Anion gap: 4 — ABNORMAL LOW (ref 5–15)
BUN: 37 mg/dL — ABNORMAL HIGH (ref 8–23)
CO2: 24 mmol/L (ref 22–32)
Calcium: 9 mg/dL (ref 8.9–10.3)
Chloride: 109 mmol/L (ref 98–111)
Creatinine, Ser: 1.37 mg/dL — ABNORMAL HIGH (ref 0.44–1.00)
GFR, Estimated: 37 mL/min — ABNORMAL LOW (ref >60)
Glucose, Bld: 105 mg/dL — ABNORMAL HIGH (ref 70–99)
Potassium: 3.7 mmol/L (ref 3.5–5.1)
Sodium: 137 mmol/L (ref 135–145)

## 2024-05-31 LAB — CBC
HCT: 35.2 % — ABNORMAL LOW (ref 36.0–46.0)
Hemoglobin: 11.8 g/dL — ABNORMAL LOW (ref 12.0–15.0)
MCH: 31.2 pg (ref 26.0–34.0)
MCHC: 33.5 g/dL (ref 30.0–36.0)
MCV: 93.1 fL (ref 80.0–100.0)
Platelets: 500 K/uL — ABNORMAL HIGH (ref 150–400)
RBC: 3.78 MIL/uL — ABNORMAL LOW (ref 3.87–5.11)
RDW: 14.2 % (ref 11.5–15.5)
WBC: 18.8 K/uL — ABNORMAL HIGH (ref 4.0–10.5)
nRBC: 0 % (ref 0.0–0.2)

## 2024-05-31 LAB — MAGNESIUM: Magnesium: 1.7 mg/dL (ref 1.7–2.4)

## 2024-05-31 MED ORDER — LOPERAMIDE HCL 2 MG PO TABS: 2.0000 mg | ORAL_TABLET | Freq: Four times a day (QID) | ORAL | 0 refills | Status: DC | PRN

## 2024-05-31 MED ORDER — SULFAMETHOXAZOLE-TRIMETHOPRIM 800-160 MG PO TABS: 1.0000 | ORAL_TABLET | Freq: Two times a day (BID) | ORAL | 0 refills | Status: DC

## 2024-05-31 MED ORDER — ONDANSETRON HCL 4 MG PO TABS: 4.0000 mg | ORAL_TABLET | Freq: Four times a day (QID) | ORAL | 0 refills | Status: DC | PRN

## 2024-05-31 NOTE — Progress Notes (Signed)
 Mobility Specialist Progress Note:    05/31/24 1215  Mobility  Activity Ambulated with assistance (From BR to Chair)  Level of Assistance Standby assist, set-up cues, supervision of patient - no hands on  Assistive Device None  Distance Ambulated (ft) 10 ft  Activity Response Tolerated well  Mobility Referral Yes  Mobility visit 1 Mobility  Mobility Specialist Start Time (ACUTE ONLY) 1203  Mobility Specialist Stop Time (ACUTE ONLY) 1213  Mobility Specialist Time Calculation (min) (ACUTE ONLY) 10 min   Received pt calling out from [facility] requesting assistance back to chair. No physical assistance needed. No c/o. Returned to chair with alarm on. Personal belongings and call light within reach. All needs met.  Lavanda Pollack Mobility Specialist  Please contact via [employer] or  Rehab Office [phone]

## 2024-05-31 NOTE — Progress Notes (Signed)
 Please call daughter with updates in morning -[name] POA

## 2024-05-31 NOTE — Progress Notes (Signed)
 DC instructions, med list, follow up reviewed with pt and daughter [facility]).  All questions answered.  Property returned, IV removed.  Volunteer services called for discharge

## 2024-05-31 NOTE — Evaluation (Signed)
 Occupational Therapy Evaluation Patient Details Name: [name] MRN: [id number] DOB: 1932-11-23 Today's Date: 05/31/2024   History of Present Illness   Pt is 89 yo female who presents on 05/29/24 with AMS and fever. PMH: migraines, chronic cerebellar infarct, HLD, HTN, Vitamin D  deficiency, rectal/ colon cancer s/p colectomy, chronic diarrhea     Clinical Impressions At baseline, pt is largely Ind with ADLs and most IADLs and was ambulating without an AD. Pt now presents with decreased balance, decreased activity tolerance, impaired cognition (at baseline), decreased B UE strength, and decreased safety and independence with functional tasks. Pt currently demonstrates ability to complete ADLs with Set up to Contact guard assist, STS transfers with close Supervision, and functional mobility without an AD with Contact guard assist. OT educated pt and her daughter in pt's current need for CGA for ambulation, assist for stairs and tub transfers, and need to sit down to perform LB dressing/bathing secondary to pt with decreased balance and activity tolerance and at risk for falls. Pt verbalizing understanding. However, daughter appeared resistant to education and repeatedly stating, She'll be fine, and instructing pt to perform unsafe tasks without assist during session, such as stand up and touch your toes. OT reviewed all education and recommendations at end of session. Pt will benefit from acute skilled OT services to address deficits outlined below and increase safety and independence with functional tasks. Post acute discharge, pt will benefit from [facility] OT paired with assistance of family.      If plan is discharge home, recommend the following:   A little help with walking and/or transfers;A little help with bathing/dressing/bathroom;Assistance with cooking/housework;Assist for transportation;Help with stairs or ramp for entrance;Direct supervision/assist for medications management;Direct  supervision/assist for financial management     Functional Status Assessment   Patient has had a recent decline in their functional status and demonstrates the ability to make significant improvements in function in a reasonable and predictable amount of time.     Equipment Recommendations   None recommended by OT     Recommendations for Other Services         Precautions/Restrictions   Precautions Precautions: Fall Restrictions Weight Bearing Restrictions Per Provider Order: No     Mobility Bed Mobility Overal bed mobility: Needs Assistance             General bed mobility comments: Pt sitting at EOB at begining of session and in chair at end of session    Transfers Overall transfer level: Needs assistance Equipment used: 1 person hand held assist, None Transfers: Sit to/from Stand, Bed to chair/wheelchair/BSC Sit to Stand: Contact guard assist     Step pivot transfers: Contact guard assist     General transfer comment: mildly unsteady      Balance Overall balance assessment: Needs assistance Sitting-balance support: No upper extremity supported, Feet supported Sitting balance-Leahy Scale: Good     Standing balance support: No upper extremity supported, Single extremity supported, During functional activity Standing balance-Leahy Scale: Fair Standing balance comment: pt able to maintain static stand without support; pt with Left lateral lean and mild unsteadiness in dynamic standing and in bending forward and requiring CGA for steadying and safety                           ADL either performed or assessed with clinical judgement   ADL Overall ADL's : Needs assistance/impaired Eating/Feeding: Set up;Sitting   Grooming: Supervision/safety;Contact guard assist;Standing   Upper  Body Bathing: Supervision/ safety;Set up;Sitting   Lower Body Bathing: Supervison/ safety;Set up;Sitting/lateral leans;Contact guard assist;Sit to/from stand    Upper Body Dressing : Set up;Sitting   Lower Body Dressing: Supervision/safety;Set up;Sitting/lateral leans   Toilet Transfer: Contact guard assist;Ambulation;Regular Toilet (without an AD)   Toileting- Clothing Manipulation and Hygiene: Supervision/safety;Set up;Sitting/lateral lean       Functional mobility during ADLs: Contact guard assist (without an AD; pt with tendancy to veer to the Left during ambulation; pt also reaching out to steady self on furniture and the wall when walking) General ADL Comments: Pt with decreased activity tolerance     Vision Patient Visual Report: No change from baseline Vision Assessment?: No apparent visual deficits (WFL for tasks assessed; not formally screened or evaluated)     Perception         Praxis         Pertinent Vitals/Pain Pain Assessment Pain Assessment: No/denies pain     Extremity/Trunk Assessment Upper Extremity Assessment Upper Extremity Assessment: Right hand dominant;Generalized weakness   Lower Extremity Assessment Lower Extremity Assessment: Defer to PT evaluation   Cervical / Trunk Assessment Cervical / Trunk Assessment: Normal   Communication Communication Communication: Impaired Factors Affecting Communication: Hearing impaired;Other (comment) (requires increased time for processing)   Cognition Arousal: Alert Behavior During Therapy: WFL for tasks assessed/performed Cognition: History of cognitive impairments, Cognition impaired     Awareness: Online awareness impaired, Intellectual awareness impaired Memory impairment (select all impairments): Short-term memory, Working memory Attention impairment (select first level of impairment): Alternating attention Executive functioning impairment (select all impairments): Organization, Reasoning, Problem solving OT - Cognition Comments: Pt and daughter report pt is at baseline cognition.                 Following commands: Impaired Following commands  impaired: Follows one step commands with increased time     Cueing  General Comments   Cueing Techniques: Verbal cues;Gestural cues;Visual cues  VSS on RA. Pt's daughter present throughout session. Pt's daughter asking pt to do unsafe actions (ex. stand up and touch your toes, walk over here) without assist. OT educated pt and daughter in pt's current need for CGA for ambulation, assist for stairs and tub transfers, and need to sit down to perform LB dressing/bathing at this time due to pt with decreased balance and activity tolerance and at risk for falls. Pt verbalized understanding; however, daughter was resistant to education and repeatedly stated, She'll be fine. Daughter also appearing frustrated with pt when pt required increased time for finger opposition during OT eval. During task, OT provided visual cues and pt initiating finger opposition, but when pt had difficulty processing move to next finger, daughter grabbed pt's hand abruptly and forcefully performed hand-over-hand finger opposition. Pt then again attempted task on her own requiring increased time for processing and motor planning and with daughter appearing frustrated and impatient with pt. OT reviewed all safety recommendations and receommendation for [facility] OT with pt and daughter at end of session. Daughter again appeared resistant to education stating pt would be fine. RN in room during a portion of the session.   Exercises     Shoulder Instructions      Home Living Family/patient expects to be discharged to:: Private residence Living Arrangements: Alone Available Help at Discharge: Family;Available PRN/intermittently Type of Home: House Home Access: Stairs to enter [employer] of Steps: flight Entrance Stairs-Rails: Left Home Layout: One level     Bathroom Shower/Tub: Tub/shower unit (pt reports she  only takes baths)   Bathroom Toilet: Standard     Home Equipment: None (daughter has a SPC pt can  use)   Additional Comments: above info is for daughter's home in [place]. Plan is to go there next couple of weeks until her house in independent living in [place] is ready. It is estimated to be ready 06/08/24.      Prior Functioning/Environment Prior Level of Function : Independent/Modified Independent             Mobility Comments: Ind without an AD ADLs Comments: Ind with ADLs and IADLs    OT Problem List: Decreased strength;Decreased activity tolerance;Impaired balance (sitting and/or standing);Decreased knowledge of precautions;Decreased knowledge of use of DME or AE   OT Treatment/Interventions: Self-care/ADL training;Therapeutic exercise;Energy conservation;DME and/or AE instruction;Therapeutic activities;Cognitive remediation/compensation;Patient/family education;Balance training      OT Goals(Current goals can be found in the care plan section)   Acute Rehab OT Goals Patient Stated Goal: to return home OT Goal Formulation: With patient/family Time For Goal Achievement: 06/14/24 Potential to Achieve Goals: Good ADL Goals Pt Will Perform Grooming: with modified independence;standing Pt Will Perform Lower Body Bathing: with modified independence;sitting/lateral leans;sit to/from stand Pt Will Perform Lower Body Dressing: with modified independence;sitting/lateral leans;sit to/from stand Pt Will Transfer to Toilet: with modified independence;regular height toilet;ambulating Pt Will Perform Toileting - Clothing Manipulation and hygiene: with modified independence;sitting/lateral leans;sit to/from stand Pt Will Perform Tub/Shower Transfer: Tub transfer;with supervision;ambulating;shower seat Pt/caregiver will Perform Home Exercise Program: Increased strength;Both right and left upper extremity;With theraband;With theraputty;With Supervision;With written HEP provided   OT Frequency:  Min 2X/week    Co-evaluation              AM-PAC OT 6 Clicks Daily Activity      Outcome Measure Help from another person eating meals?: A Little Help from another person taking care of personal grooming?: A Little Help from another person toileting, which includes using toliet, bedpan, or urinal?: A Little Help from another person bathing (including washing, rinsing, drying)?: A Little Help from another person to put on and taking off regular upper body clothing?: A Little Help from another person to put on and taking off regular lower body clothing?: A Little 6 Click Score: 18   End of Session Nurse Communication: Mobility status;Other (comment) (OT recommends HH OT and assistance for mobility and ADLs)  Activity Tolerance: Patient tolerated treatment well Patient left: in chair;with nursing/sitter in room;with family/visitor present  OT Visit Diagnosis: Unsteadiness on feet (R26.81);Muscle weakness (generalized) (M62.81)                Time: 8695-8680 OT Time Calculation (min): 15 min Charges:  OT General Charges $OT Visit: 1 Visit OT Evaluation $OT Eval Low Complexity: 1 Low  Margarie Rockey HERO., OTR/L, MA Acute Rehab [phone]   Margarie FORBES Horns 05/31/2024, 1:45 PM

## 2024-05-31 NOTE — Discharge Summary (Signed)
 Physician Discharge Summary   Patient: [name] MRN: [id number] DOB: [birth year]  Admit date:     05/29/2024  Discharge date: 05/31/24  Discharge Physician: Carliss LELON Canales   PCP: [name] Greig BRAVO, MD   Recommendations at discharge:    Pt to be discharged home with plans to live with daughter.   If you experience worsening fever, chills, chest pain, shortness of breath, or other concerning symptoms, please call your PCP or go to the emergency department immediately.  Discharge Diagnoses: Principal Problem:   Acute renal failure superimposed on stage 2 chronic kidney disease (HCC) Active Problems:   Chronic diarrhea   Acute cystitis   HLD (hyperlipidemia)   Essential hypertension, benign   Altered mental state   Acute kidney injury superimposed on chronic kidney disease (HCC)  Resolved Problems:   * No resolved hospital problems. *   Hospital Course:  89 y.o. female with medical history significant of essential hypertension, mixed hyperlipidemia, vitamin D  deficiency, osteoporosis, history of rectal cancer/colon cancer status post colectomy (unknown timeline) and chronic diarrhea presented to emergency department via EMS with complaining of altered mental status, chronic diarrhea and fever.    Assessment and Plan:   Concern for sepsis - Profound leukocytosis, encephalopathy, UTI on presentation given concern for sepsis.  Blood cultures, fluid bolus, empiric antibiotics on board.  Leukocytosis improved, encephalopathy resolved.  Appears resolved.  Acute metabolic encephalopathy - Confusion and decreased eating and drinking prior to admission.  Appears to be showing improvement this morning.  Patient more alert, talkative.  Appears resolved  Acute cystitis without hematuria - UA showing LE, WBCs, bacteria.  Empiric ceftriaxone  on board while inpatient.  Urine cultures noting E. coli resistant to ampicillin/Augmentin.  Will transition patient to p.o. Bactrim  to take as directed  upon discharge.   Acute kidney injury on CKD 2 - Serum creatinine markedly above baseline.  Showed marked improvement after IV fluid hydration.  Encourage continued oral hydration upon discharge.  Acute on chronic diarrhea - Patient not complaining of any loose stools however family states that she is had frequent loose bowel movements for the last few days prior to admission.  No loose bowel movements while inpatient.  No concern for C. difficile at this time.  Will give as needed Lomotil  upon discharge.   Hypertension - Discontinue losartan  for now as can make acute kidney injury course.  Continue diltiazem .  Follow-up with PCP to possibly titrate antihypertensive regimen.   Physical debilitation muscle weakness - Patient's daughter stating that she is going to live in independent/assisted living near the end of the month.  Wants to take her home to live with the daughter for now for continued evaluation.  Consultants: None Procedures performed: None Disposition: Home Diet recommendation:  Discharge Diet Orders (From admission, onward)     Start     Ordered   05/31/24 0000  Diet - low sodium heart healthy        05/31/24 1108           Cardiac diet  DISCHARGE MEDICATION: Allergies as of 05/31/2024   No Known Allergies      Medication List     STOP taking these medications    colestipol  1 g tablet Commonly known as: COLESTID    losartan  100 MG tablet Commonly known as: COZAAR        TAKE these medications    aspirin  EC 81 MG tablet Take 1 tablet (81 mg total) by mouth daily. Swallow whole.   diltiazem   240 MG 24 hr capsule Commonly known as: CARDIZEM  CD Take 1 capsule (240 mg total) by mouth daily.   loperamide  2 MG tablet Commonly known as: IMODIUM  A-D Take 1 tablet (2 mg total) by mouth 4 (four) times daily as needed for diarrhea or loose stools.   ondansetron  4 MG tablet Commonly known as: ZOFRAN  Take 1 tablet (4 mg total) by mouth every 6 (six) hours  as needed for nausea.   Opium  10 MG/ML (1%) Tinc Please take 0.2-0.4 mL three times per day   sulfamethoxazole -trimethoprim  800-160 MG tablet Commonly known as: Bactrim  DS Take 1 tablet by mouth 2 (two) times daily.         Discharge Exam: Filed Weights   05/29/24 2300  Weight: 43.9 kg    GENERAL:  Alert, pleasant, no acute distress, frail HEENT:  EOMI CARDIOVASCULAR:  RRR, no murmurs appreciated RESPIRATORY:  Clear to auscultation, no wheezing, rales, or rhonchi GASTROINTESTINAL:  Soft, nontender, nondistended EXTREMITIES:  No LE edema bilaterally NEURO:  No new focal deficits appreciated SKIN:  No rashes noted PSYCH:  Appropriate mood and affect    Condition at discharge: improving  The results of significant diagnostics from this hospitalization (including imaging, microbiology, ancillary and laboratory) are listed below for reference.   Imaging Studies: CT HEAD WO CONTRAST Result Date: 05/29/2024 EXAM: CT HEAD WITHOUT CONTRAST 05/29/2024 09:25:54 PM TECHNIQUE: CT of the head was performed without the administration of intravenous contrast. Automated exposure control, iterative reconstruction, and/or weight based adjustment of the mA/kV was utilized to reduce the radiation dose to as low as reasonably achievable. COMPARISON: Comparison with CT dated 06/03/2021. CLINICAL HISTORY: Mental status change, unknown cause. FINDINGS: BRAIN AND VENTRICLES: Chronic microvascular ischemia and generalized atrophy. No acute hemorrhage. Gray-white differentiation is preserved. No hydrocephalus. No extra-axial collection. No mass effect or midline shift. ORBITS: No acute abnormality. SINUSES: No acute abnormality. SOFT TISSUES AND SKULL: Ill-defined lucency in the anterior right frontal bone, which was better evaluated on MRI in 05/2021 and has not significantly changed. No acute soft tissue abnormality. No skull fracture. IMPRESSION: 1. No acute intracranial abnormality. Electronically signed  by: Norman Gatlin MD 05/29/2024 09:43 PM EDT RP Workstation: [id number]   DG Chest Portable 1 View Result Date: 05/29/2024 EXAM: 1 VIEW(S) XRAY OF THE CHEST 05/29/2024 09:23:00 PM COMPARISON: None available. CLINICAL HISTORY: Weakness. Patient has been being treated for chronic diarrhea. Patient has become more altered and weak since Saturday and today noticed the patient had a fever. FINDINGS: LUNGS AND PLEURA: Chronic bronchitic change. No focal consolidation. No pleural effusion. No pneumothorax. HEART AND MEDIASTINUM: Stable cardiomyopathy. BONES AND SOFT TISSUES: Aortic atherosclerotic calcification. IMPRESSION: 1. No acute cardiopulmonary pathology. 2. Chronic bronchitic change. Electronically signed by: Norman Gatlin MD 05/29/2024 09:39 PM EDT RP Workstation: [id number]    Microbiology: Results for orders placed or performed during the hospital encounter of 05/29/24  Urine Culture     Status: Abnormal   Collection Time: 05/29/24 10:31 PM   Specimen: Urine, Random  Result Value Ref Range Status   Specimen Description URINE, RANDOM  Final   Special Requests   Final    NONE Reflexed from [id number] Performed at [facility], [address], [place], [place] [address]    Culture >=100,000 COLONIES/mL ESCHERICHIA COLI (A)  Final   Report Status 05/31/2024 FINAL  Final   Organism ID, Bacteria ESCHERICHIA COLI (A)  Final      Susceptibility   Escherichia coli - MIC*    AMPICILLIN >=  32 RESISTANT Resistant     CEFAZOLIN <=4 SENSITIVE Sensitive     CEFEPIME <=0.12 SENSITIVE Sensitive     CEFTRIAXONE  <=0.25 SENSITIVE Sensitive     CIPROFLOXACIN <=0.25 SENSITIVE Sensitive     GENTAMICIN <=1 SENSITIVE Sensitive     IMIPENEM <=0.25 SENSITIVE Sensitive     NITROFURANTOIN <=16 SENSITIVE Sensitive     TRIMETH /SULFA  <=20 SENSITIVE Sensitive     AMPICILLIN/SULBACTAM 16 INTERMEDIATE Intermediate     PIP/TAZO <=4 SENSITIVE Sensitive ug/mL    * >=100,000 COLONIES/mL ESCHERICHIA COLI  Blood  culture (routine x 2)     Status: None (Preliminary result)   Collection Time: 05/30/24 12:36 AM   Specimen: BLOOD RIGHT HAND  Result Value Ref Range Status   Specimen Description BLOOD RIGHT HAND  Final   Special Requests   Final    BOTTLES DRAWN AEROBIC ONLY Blood Culture results may not be optimal due to an inadequate volume of blood received in culture bottles   Culture   Final    NO GROWTH 1 DAY Performed at [facility], [address], [place], [place] [address]    Report Status PENDING  Incomplete  Blood culture (routine x 2)     Status: None (Preliminary result)   Collection Time: 05/30/24 12:51 AM   Specimen: BLOOD LEFT HAND  Result Value Ref Range Status   Specimen Description BLOOD LEFT HAND  Final   Special Requests   Final    BOTTLES DRAWN AEROBIC AND ANAEROBIC Blood Culture results may not be optimal due to an inadequate volume of blood received in culture bottles   Culture   Final    NO GROWTH 1 DAY Performed at [facility], [address] [place], [place] [address]    Report Status PENDING  Incomplete    Labs: CBC: Recent Labs  Lab 05/29/24 2105 05/29/24 2110 05/30/24 0716 05/31/24 0830  WBC 26.2*  --  22.7* 18.8*  HGB 13.7 13.6 11.9* 11.8*  HCT 41.2 40.0 35.6* 35.2*  MCV 94.5  --  92.0 93.1  PLT 524*  --  447* 500*   Basic Metabolic Panel: Recent Labs  Lab 05/29/24 2105 05/29/24 2110 05/30/24 0716 05/31/24 0830  NA 134* 134* 134* 137  K 4.0 3.8 3.7 3.7  CL 101  --  104 109  CO2 24  --  21* 24  GLUCOSE 133*  --  130* 105*  BUN 56*  --  52* 37*  CREATININE 2.16*  --  1.85* 1.37*  CALCIUM 9.6  --  9.2 9.0  MG  --   --   --  1.7   Liver Function Tests: Recent Labs  Lab 05/29/24 2105  AST 17  ALT 22  ALKPHOS 70  BILITOT 0.6  PROT 5.9*  ALBUMIN 2.5*   CBG: Recent Labs  Lab 05/29/24 2149  GLUCAP 136*    Discharge time spent: 31 minutes.  Length of inpatient stay: 2 days  Signed: Carliss LELON Canales, DO [facility] 05/31/2024

## 2024-05-31 NOTE — Evaluation (Signed)
 Physical Therapy Evaluation Patient Details Name: [name] MRN: [id number] DOB: 1933-08-07 Today's Date: 05/31/2024  History of Present Illness  Pt is 89 yo female who presents on 05/29/24 with AMS and fever. PMH: migraines, chronic cerebellar infarct, HLD, HTN, Vitamin D  deficiency, rectal/ colon cancer s/p colectomy, chronic diarrhea  Clinical Impression  Pt admitted with above diagnosis. Pt from home alone with grandson checking on her each day. She is moving into independent living center in the next few weeks and plans to go home with her daughter to [place] until then, though daughter works in the day. Pt presents with generalized weakness and instability with gait, likely from last few days in the bed. She had L drift with ambulation when fatigued and educated daughter on use of SPC if needed. Pt would benefit from HHPT while at her daughter's home.  Pt currently with functional limitations due to the deficits listed below (see PT Problem List). Pt will benefit from acute skilled PT to increase their independence and safety with mobility to allow discharge.           If plan is discharge home, recommend the following: Assist for transportation;Help with stairs or ramp for entrance;Assistance with cooking/housework   Can travel by private vehicle        Equipment Recommendations None recommended by PT  Recommendations for Other Services       Functional Status Assessment Patient has had a recent decline in their functional status and demonstrates the ability to make significant improvements in function in a reasonable and predictable amount of time.     Precautions / Restrictions Precautions Precautions: Fall Restrictions Weight Bearing Restrictions Per Provider Order: No      Mobility  Bed Mobility Overal bed mobility: Needs Assistance Bed Mobility: Supine to Sit     Supine to sit: Supervision     General bed mobility comments: pt moves slowly but able to come  to EOB without physical assist    Transfers Overall transfer level: Needs assistance Equipment used: 1 person hand held assist, None Transfers: Sit to/from Stand Sit to Stand: Min assist, Contact guard assist           General transfer comment: mildly unsteady    Ambulation/Gait Ambulation/Gait assistance: Min assist, Contact guard assist Gait Distance (Feet): 250 Feet Assistive device: None Gait Pattern/deviations: Step-through pattern, Drifts right/left Gait velocity: decreased Gait velocity interpretation: 1.31 - 2.62 ft/sec, indicative of limited community ambulator   General Gait Details: pt began ambulation without support and was mildy unsteady but no full LOB. After steps when pt was fatigued she had L drift with LOB to L x2 with wall to correct and preferred to grab hallway rail. Educated daughter on having her use SPC on L side.  Stairs Stairs: Yes Stairs assistance: Min assist Stair Management: One rail Left, Step to pattern, Forwards Number of Stairs: 3 General stair comments: able to complete steps but fatigued after  Wheelchair Mobility     Tilt Bed    Modified Rankin (Stroke Patients Only)       Balance Overall balance assessment: Needs assistance Sitting-balance support: No upper extremity supported, Feet supported Sitting balance-Leahy Scale: Good     Standing balance support: No upper extremity supported Standing balance-Leahy Scale: Fair                               Pertinent Vitals/Pain Pain Assessment Pain Assessment: No/denies pain  Home Living Family/patient expects to be discharged to:: Private residence Living Arrangements: Alone Available Help at Discharge: Family;Available PRN/intermittently Type of Home: House Home Access: Stairs to enter Entrance Stairs-Rails: Left Entrance Stairs-Number of Steps: flight   Home Layout: One level Home Equipment: None (daughter has [facility]) Additional Comments: above info is  for daughter's home in [place]. Plan is to go there next couple of weeks until her house in independent living in [place] is ready.    Prior Function Prior Level of Function : Independent/Modified Independent                     Extremity/Trunk Assessment   Upper Extremity Assessment Upper Extremity Assessment: Generalized weakness    Lower Extremity Assessment Lower Extremity Assessment: Generalized weakness    Cervical / Trunk Assessment Cervical / Trunk Assessment: Normal  Communication   Communication Communication: Impaired Factors Affecting Communication: Hearing impaired    Cognition Arousal: Alert Behavior During Therapy: WFL for tasks assessed/performed   PT - Cognitive impairments: History of cognitive impairments                       PT - Cognition Comments: pt and daughter feel that pt has returned to baseline. 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 Daughter and pt educated on the toll 2 days in the bed takes an older pt and that pt should not be doing stairs independently right now or sitting outside in the heat. Would benefit from HHPT    Exercises     Assessment/Plan    PT Assessment Patient needs continued PT services  PT Problem List Decreased strength;Decreased balance       PT Treatment Interventions Gait training;Stair training;Functional mobility training;Therapeutic activities;Therapeutic exercise;Balance training;DME instruction;Patient/family education    PT Goals (Current goals can be found in the Care Plan section)  Acute Rehab PT Goals Patient Stated Goal: return home PT Goal Formulation: With patient/family Time For Goal Achievement:  06/14/24 Potential to Achieve Goals: Good    Frequency Min 2X/week     Co-evaluation               AM-PAC PT 6 Clicks Mobility  Outcome Measure Help needed turning from your back to your side while in a flat bed without using bedrails?: None Help needed moving from lying on your back to sitting on the side of a flat bed without using bedrails?: A Little Help needed moving to and from a bed to a chair (including a wheelchair)?: A Little Help needed standing up from a chair using your arms (e.g., wheelchair or bedside chair)?: A Little Help needed to walk in hospital room?: A Little Help needed climbing 3-5 steps with a railing? : A Little 6 Click Score: 19    End of Session Equipment Utilized During Treatment: Gait belt Activity Tolerance: Patient tolerated treatment well;Patient limited by fatigue Patient left: in chair;with call bell/phone within reach;with chair alarm set;with family/visitor present Nurse Communication: Mobility status PT Visit Diagnosis: Unsteadiness on feet (R26.81)    Time: 8941-8871 PT Time Calculation (min) (ACUTE ONLY): 30 min   Charges:   PT Evaluation $PT Eval Moderate Complexity: 1 Mod PT Treatments $Gait Training: 8-22 mins PT General Charges $$  ACUTE PT VISIT: 1 Visit         Richerd Lipoma, PT  Acute Rehab Services Secure chat preferred Office [phone]   Richerd LITTIE Lipoma 05/31/2024, 11:55 AM

## 2024-05-31 NOTE — TOC CM/SW Note (Signed)
 Spoke to patient and daughter [name] at bedside.   NCM received secure chat patient is saying with daughter in [place] and wanting home health Pt arranged.   [name] lives at [address] , [place], [address]. However, patient will only be there until June 08, 2024 . Daughter bringing her mother back to [place] on June 08, 2024 for appointment with PCP Greig Ring and then moving into independent living which has there own PT. NCM unable to arrange HHPT in [place] for a week. Daughter stated she understood and meant for HHPT to be started when she moves into independent living. PCP will need to arrange HHPT at appointment on June 08, 2024 . Daughter voiced understanding

## 2024-06-01 ENCOUNTER — Telehealth: Payer: Self-pay

## 2024-06-01 NOTE — Transitions of Care (Post Inpatient/ED Visit) (Signed)
   06/01/2024  Name: [name] MRN: [id number] DOB: 1932/11/09  Today's TOC FU Call Status: Today's TOC FU Call Status:: Unsuccessful Call (1st Attempt) Unsuccessful Call (1st Attempt) Date: 06/01/24  Attempted to reach the patient regarding the most recent Inpatient/ED visit.  Follow Up Plan: Additional outreach attempts will be made to reach the patient to complete the Transitions of Care (Post Inpatient/ED visit) call.   Arvin Seip RN, BSN, CCM [employer], Population Health Case Manager Phone: [phone]

## 2024-06-04 ENCOUNTER — Telehealth: Payer: Self-pay

## 2024-06-04 LAB — CULTURE, BLOOD (ROUTINE X 2)
Culture: NO GROWTH
Culture: NO GROWTH

## 2024-06-04 NOTE — Transitions of Care (Post Inpatient/ED Visit) (Signed)
   06/04/2024  Name: [name] MRN: [id number] DOB: 01-31-1933  Today's TOC FU Call Status: Today's TOC FU Call Status:: Unsuccessful Call (2nd Attempt) Unsuccessful Call (2nd Attempt) Date: 06/04/24  Attempted to reach the patient regarding the most recent Inpatient/ED visit.  Follow Up Plan: Additional outreach attempts will be made to reach the patient to complete the Transitions of Care (Post Inpatient/ED visit) call.   Alan Ee, RN, BSN, CEN [employer]- Transition of Care Team.  [facility] [phone]

## 2024-06-05 ENCOUNTER — Telehealth: Payer: Self-pay

## 2024-06-05 NOTE — Transitions of Care (Post Inpatient/ED Visit) (Signed)
   06/05/2024  Name: AUDA FINFROCK MRN: [id number] DOB: 1932/12/10  Today's TOC FU Call Status: Today's TOC FU Call Status:: Unsuccessful Call (2nd Attempt) Unsuccessful Call (2nd Attempt) Date: 06/05/24  Attempted to reach the patient regarding the most recent Inpatient/ED visit.  Follow Up Plan: Additional outreach attempts will be made to reach the patient to complete the Transitions of Care (Post Inpatient/ED visit) call.   Arvin Seip RN, BSN, CCM [employer], Population Health Case Manager Phone: [phone]

## 2024-06-05 NOTE — Transitions of Care (Post Inpatient/ED Visit) (Signed)
   06/05/2024  Name: JAIMARIE RAPOZO MRN: [id number] DOB: 1933-10-22  Today's TOC FU Call Status: Today's TOC FU Call Status:: Unsuccessful Call (2nd Attempt) Unsuccessful Call (2nd Attempt) Date: 06/05/24  Attempted to reach the patient regarding the most recent Inpatient/ED visit.  Follow Up Plan: Additional outreach attempts will be made to reach the patient to complete the Transitions of Care (Post Inpatient/ED visit) call.   Arvin Seip RN, BSN, CCM [employer], Population Health Case Manager Phone: [phone]

## 2024-06-06 ENCOUNTER — Other Ambulatory Visit: Payer: Self-pay | Admitting: Family Medicine

## 2024-06-08 ENCOUNTER — Ambulatory Visit (INDEPENDENT_AMBULATORY_CARE_PROVIDER_SITE_OTHER): Admitting: Family Medicine

## 2024-06-08 VITALS — BP 134/62 | HR 98 | Temp 97.8°F | Ht 64.0 in | Wt 97.8 lb

## 2024-06-08 DIAGNOSIS — R600 Localized edema: Secondary | ICD-10-CM | POA: Diagnosis not present

## 2024-06-08 DIAGNOSIS — K529 Noninfective gastroenteritis and colitis, unspecified: Secondary | ICD-10-CM

## 2024-06-08 DIAGNOSIS — R32 Unspecified urinary incontinence: Secondary | ICD-10-CM | POA: Diagnosis not present

## 2024-06-08 DIAGNOSIS — I10 Essential (primary) hypertension: Secondary | ICD-10-CM | POA: Diagnosis not present

## 2024-06-08 DIAGNOSIS — N179 Acute kidney failure, unspecified: Secondary | ICD-10-CM | POA: Diagnosis not present

## 2024-06-08 DIAGNOSIS — N189 Chronic kidney disease, unspecified: Secondary | ICD-10-CM | POA: Diagnosis not present

## 2024-06-08 DIAGNOSIS — R2689 Other abnormalities of gait and mobility: Secondary | ICD-10-CM

## 2024-06-08 MED ORDER — DILTIAZEM HCL ER COATED BEADS 240 MG PO CP24: 240.0000 mg | ORAL_CAPSULE | Freq: Every day | ORAL | 3 refills | Status: DC

## 2024-06-08 NOTE — Assessment & Plan Note (Signed)
 Small leakage on the way to the office today.  No other urinary symptoms We will continue to follow it and let me know if we need to repeat urinalysis.  Of note patient used the bathroom before urinalysis could be obtained in office.

## 2024-06-08 NOTE — Patient Instructions (Signed)
 Follow BP at home... remain off losartan  ... Only restart if BP > 140/90. Elevate legs above heart when sitting, trying to get back to moving more.  Please stop at the lab to have labs drawn.

## 2024-06-08 NOTE — Assessment & Plan Note (Signed)
 Acute, most likely edema is due to patient's recent decreased physical activity and IV fluids in hospital.  I encouraged her to elevate her feet above her heart and to get back to physical activity is much as able.  We will reevaluate with a complete metabolic panel BMP and CBC to make sure there is no clear secondary cause of worsening edema.

## 2024-06-08 NOTE — Assessment & Plan Note (Signed)
 Now back to normal p.o. and liquid intake.  Encourage patient to keep up with liquids. Will reevaluate kidney function in office today.

## 2024-06-08 NOTE — Progress Notes (Signed)
 Patient ID: [name], female    DOB: 1933/03/03, 89 y.o.   MRN: [id number]  This visit was conducted in person.  BP 134/62   Pulse 98   Temp 97.8 F (36.6 C) (Oral)   Ht 5' 4 (1.626 m)   Wt 97 lb 12.8 oz (44.4 kg)   SpO2 98%   BMI 16.79 kg/m    CC:  Chief Complaint  Patient presents with   Medical Management of Chronic Issues    Follow up on all medical conditions has form that needs to be filled out    Leg Swelling    B/l legs     Subjective:   HPI: [name] is a 89 y.o. female presenting on 06/08/2024 for Medical Management of Chronic Issues (Follow up on all medical conditions has form that needs to be filled out ) and Leg Swelling (B/l legs )   Hospital admission 8/5 to 8/7  Seen for acute worsening of diarrhea  Dx with dehydration, acute renal failure and acute cystitis Concern for sepsis - Profound leukocytosis, encephalopathy, UTI on presentation given concern for sepsis.  Blood cultures, fluid bolus, empiric antibiotics on board.  Leukocytosis improved, encephalopathy resolved. Completed antibiotics Bactrim  orally after discharge.   Stopped losartan  while in hospital to avoid kidney issues. BP Readings from Last 3 Encounters:  06/08/24 134/62  05/31/24 (!) 143/69  01/05/23 134/76    Neg Cdiff.   She into assisted living ...moving in [place], independent living   Since being home diarrhea back to baseline.  No dysuria, frequency, no incontinence.... she is wearning Depends now and some loss of urine today.  No fever.  No abdominal pain.  New peripheral swelling in both legs in last week.  Some better after elevating over night.  She is much more on balance in last 2 days.  No SOB, no chest pain   She is moving less since in hospital.  Daughters are requesting home health aide given her difficulty remembering to take medication and some difficulty toileting if she has a large episode of rectal incontinence/diarrhea.      Relevant  past medical, surgical, family and social history reviewed and updated as indicated. Interim medical history since our last visit reviewed. Allergies and medications reviewed and updated. Outpatient Medications Prior to Visit  Medication Sig Dispense Refill   aspirin  EC 81 MG EC tablet Take 1 tablet (81 mg total) by mouth daily. Swallow whole. 30 tablet 0   loperamide  (IMODIUM  A-D) 2 MG tablet Take 1 tablet (2 mg total) by mouth 4 (four) times daily as needed for diarrhea or loose stools. 30 tablet 0   ondansetron  (ZOFRAN ) 4 MG tablet Take 1 tablet (4 mg total) by mouth every 6 (six) hours as needed for nausea. 20 tablet 0   Opium  10 MG/ML (1%) TINC Please take 0.2-0.4 mL three times per day     diltiazem  (CARDIZEM  CD) 240 MG 24 hr capsule Take 1 capsule (240 mg total) by mouth daily. 90 capsule 3   losartan  (COZAAR ) 100 MG tablet Take 100 mg by mouth daily.     sulfamethoxazole -trimethoprim  (BACTRIM  DS) 800-160 MG tablet Take 1 tablet by mouth 2 (two) times daily. (Patient not taking: Reported on 06/08/2024) 10 tablet 0   No facility-administered medications prior to visit.     Per HPI unless specifically indicated in ROS section below Review of Systems  Constitutional:  Negative for fatigue and fever.  HENT:  Negative for  congestion.   Eyes:  Negative for pain.  Respiratory:  Negative for cough and shortness of breath.   Cardiovascular:  Positive for leg swelling. Negative for chest pain and palpitations.  Gastrointestinal:  Negative for abdominal pain.  Genitourinary:  Negative for dysuria and vaginal bleeding.  Musculoskeletal:  Negative for back pain.  Neurological:  Negative for syncope, light-headedness and headaches.  Psychiatric/Behavioral:  Negative for dysphoric mood.    Objective:  BP 134/62   Pulse 98   Temp 97.8 F (36.6 C) (Oral)   Ht 5' 4 (1.626 m)   Wt 97 lb 12.8 oz (44.4 kg)   SpO2 98%   BMI 16.79 kg/m   Wt Readings from Last 3 Encounters:  06/08/24 97 lb 12.8  oz (44.4 kg)  05/29/24 96 lb 12.5 oz (43.9 kg)  01/05/23 110 lb (49.9 kg)      Physical Exam Constitutional:      General: She is not in acute distress.    Appearance: Normal appearance. She is well-developed. She is not ill-appearing or toxic-appearing.  HENT:     Head: Normocephalic.     Right Ear: Hearing, tympanic membrane, ear canal and external ear normal. Tympanic membrane is not erythematous, retracted or bulging.     Left Ear: Hearing, tympanic membrane, ear canal and external ear normal. Tympanic membrane is not erythematous, retracted or bulging.     Nose: No mucosal edema or rhinorrhea.     Right Sinus: No maxillary sinus tenderness or frontal sinus tenderness.     Left Sinus: No maxillary sinus tenderness or frontal sinus tenderness.     Mouth/Throat:     Pharynx: Uvula midline.  Eyes:     General: Lids are normal. Lids are everted, no foreign bodies appreciated.     Conjunctiva/sclera: Conjunctivae normal.     Pupils: Pupils are equal, round, and reactive to light.  Neck:     Thyroid : No thyroid  mass or thyromegaly.     Vascular: No carotid bruit.     Trachea: Trachea normal.  Cardiovascular:     Rate and Rhythm: Normal rate and regular rhythm.     Pulses: Normal pulses.     Heart sounds: Normal heart sounds, S1 normal and S2 normal. No murmur heard.    No friction rub. No gallop.  Pulmonary:     Effort: Pulmonary effort is normal. No tachypnea or respiratory distress.     Breath sounds: Normal breath sounds. No decreased breath sounds, wheezing, rhonchi or rales.  Abdominal:     General: Bowel sounds are normal.     Palpations: Abdomen is soft.     Tenderness: There is no abdominal tenderness.  Musculoskeletal:     Cervical back: Normal range of motion and neck supple.     Right lower leg: 2+ Edema present.     Left lower leg: 2+ Edema present.  Skin:    General: Skin is warm and dry.     Findings: No rash.  Neurological:     Mental Status: She is alert.   Psychiatric:        Mood and Affect: Mood is not anxious or depressed.        Speech: Speech normal.        Behavior: Behavior normal. Behavior is cooperative.        Thought Content: Thought content normal.        Judgment: Judgment normal.       Results for orders placed or performed during the hospital  encounter of 05/29/24  Comprehensive metabolic panel   Collection Time: 05/29/24  9:05 PM  Result Value Ref Range   Sodium 134 (L) 135 - 145 mmol/L   Potassium 4.0 3.5 - 5.1 mmol/L   Chloride 101 98 - 111 mmol/L   CO2 24 22 - 32 mmol/L   Glucose, Bld 133 (H) 70 - 99 mg/dL   BUN 56 (H) 8 - 23 mg/dL   Creatinine, Ser 7.83 (H) 0.44 - 1.00 mg/dL   Calcium 9.6 8.9 - 89.6 mg/dL   Total Protein 5.9 (L) 6.5 - 8.1 g/dL   Albumin 2.5 (L) 3.5 - 5.0 g/dL   AST 17 15 - 41 U/L   ALT 22 0 - 44 U/L   Alkaline Phosphatase 70 38 - 126 U/L   Total Bilirubin 0.6 0.0 - 1.2 mg/dL   GFR, Estimated 21 (L) >60 mL/min   Anion gap 9 5 - 15  CBC   Collection Time: 05/29/24  9:05 PM  Result Value Ref Range   WBC 26.2 (H) 4.0 - 10.5 K/uL   RBC 4.36 3.87 - 5.11 MIL/uL   Hemoglobin 13.7 12.0 - 15.0 g/dL   HCT 58.7 63.9 - 53.9 %   MCV 94.5 80.0 - 100.0 fL   MCH 31.4 26.0 - 34.0 pg   MCHC 33.3 30.0 - 36.0 g/dL   RDW 85.9 88.4 - 84.4 %   Platelets 524 (H) 150 - 400 K/uL   nRBC 0.0 0.0 - 0.2 %  Ethanol   Collection Time: 05/29/24  9:05 PM  Result Value Ref Range   Alcohol, Ethyl (B) <15 <15 mg/dL  Ammonia   Collection Time: 05/29/24  9:05 PM  Result Value Ref Range   Ammonia 18 9 - 35 umol/L  I-Stat venous blood gas, ED   Collection Time: 05/29/24  9:10 PM  Result Value Ref Range   pH, Ven 7.418 7.25 - 7.43   pCO2, Ven 37.6 (L) 44 - 60 mmHg   pO2, Ven 26 (LL) 32 - 45 mmHg   Bicarbonate 24.3 20.0 - 28.0 mmol/L   TCO2 25 22 - 32 mmol/L   O2 Saturation 48 %   Acid-Base Excess 0.0 0.0 - 2.0 mmol/L   Sodium 134 (L) 135 - 145 mmol/L   Potassium 3.8 3.5 - 5.1 mmol/L   Calcium, Ion 1.23 1.15 -  1.40 mmol/L   HCT 40.0 36.0 - 46.0 %   Hemoglobin 13.6 12.0 - 15.0 g/dL   Sample type VENOUS    Comment NOTIFIED PHYSICIAN   I-Stat CG4 Lactic Acid   Collection Time: 05/29/24  9:10 PM  Result Value Ref Range   Lactic Acid, Venous 1.3 0.5 - 1.9 mmol/L  CBG monitoring, ED   Collection Time: 05/29/24  9:49 PM  Result Value Ref Range   Glucose-Capillary 136 (H) 70 - 99 mg/dL  Urine Culture   Collection Time: 05/29/24 10:31 PM   Specimen: Urine, Random  Result Value Ref Range   Specimen Description URINE, RANDOM    Special Requests      NONE Reflexed from [id number] Performed at [facility], [address], [place], [place] [address]    Culture >=100,000 COLONIES/mL ESCHERICHIA COLI (A)    Report Status 05/31/2024 FINAL    Organism ID, Bacteria ESCHERICHIA COLI (A)       Susceptibility   Escherichia coli - MIC*    AMPICILLIN >=32 RESISTANT Resistant     CEFAZOLIN <=4 SENSITIVE Sensitive     CEFEPIME <=  0.12 SENSITIVE Sensitive     CEFTRIAXONE  <=0.25 SENSITIVE Sensitive     CIPROFLOXACIN <=0.25 SENSITIVE Sensitive     GENTAMICIN <=1 SENSITIVE Sensitive     IMIPENEM <=0.25 SENSITIVE Sensitive     NITROFURANTOIN <=16 SENSITIVE Sensitive     TRIMETH /SULFA  <=20 SENSITIVE Sensitive     AMPICILLIN/SULBACTAM 16 INTERMEDIATE Intermediate     PIP/TAZO <=4 SENSITIVE Sensitive ug/mL    * >=100,000 COLONIES/mL ESCHERICHIA COLI  Urinalysis, w/ Reflex to Culture (Infection Suspected) -Urine, Clean Catch   Collection Time: 05/29/24 10:31 PM  Result Value Ref Range   Specimen Source URINE, CLEAN CATCH    Color, Urine YELLOW YELLOW   APPearance TURBID (A) CLEAR   Specific Gravity, Urine 1.011 1.005 - 1.030   pH 5.0 5.0 - 8.0   Glucose, UA NEGATIVE NEGATIVE mg/dL   Hgb urine dipstick MODERATE (A) NEGATIVE   Bilirubin Urine NEGATIVE NEGATIVE   Ketones, ur NEGATIVE NEGATIVE mg/dL   Protein, ur >=699 (A) NEGATIVE mg/dL   Nitrite NEGATIVE NEGATIVE   Leukocytes,Ua LARGE (A) NEGATIVE   RBC  / HPF >50 0 - 5 RBC/hpf   WBC, UA >50 0 - 5 WBC/hpf   Bacteria, UA MANY (A) NONE SEEN   Squamous Epithelial / HPF 0-5 0 - 5 /HPF   WBC Clumps PRESENT    Mucus PRESENT   Blood culture (routine x 2)   Collection Time: 05/30/24 12:36 AM   Specimen: BLOOD RIGHT HAND  Result Value Ref Range   Specimen Description BLOOD RIGHT HAND    Special Requests      BOTTLES DRAWN AEROBIC ONLY Blood Culture results may not be optimal due to an inadequate volume of blood received in culture bottles   Culture      NO GROWTH 5 DAYS Performed at [facility], [address] [place], [place] [address]    Report Status 06/04/2024 FINAL   Blood culture (routine x 2)   Collection Time: 05/30/24 12:51 AM   Specimen: BLOOD LEFT HAND  Result Value Ref Range   Specimen Description BLOOD LEFT HAND    Special Requests      BOTTLES DRAWN AEROBIC AND ANAEROBIC Blood Culture results may not be optimal due to an inadequate volume of blood received in culture bottles   Culture      NO GROWTH 5 DAYS Performed at [facility], [address], [place], [place] [address]    Report Status 06/04/2024 FINAL   CBC   Collection Time: 05/30/24  7:16 AM  Result Value Ref Range   WBC 22.7 (H) 4.0 - 10.5 K/uL   RBC 3.87 3.87 - 5.11 MIL/uL   Hemoglobin 11.9 (L) 12.0 - 15.0 g/dL   HCT 64.3 (L) 63.9 - 53.9 %   MCV 92.0 80.0 - 100.0 fL   MCH 30.7 26.0 - 34.0 pg   MCHC 33.4 30.0 - 36.0 g/dL   RDW 85.8 88.4 - 84.4 %   Platelets 447 (H) 150 - 400 K/uL   nRBC 0.0 0.0 - 0.2 %  Basic metabolic panel   Collection Time: 05/30/24  7:16 AM  Result Value Ref Range   Sodium 134 (L) 135 - 145 mmol/L   Potassium 3.7 3.5 - 5.1 mmol/L   Chloride 104 98 - 111 mmol/L   CO2 21 (L) 22 - 32 mmol/L   Glucose, Bld 130 (H) 70 - 99 mg/dL   BUN 52 (H) 8 - 23 mg/dL   Creatinine, Ser 8.14 (H)  0.44 - 1.00 mg/dL   Calcium 9.2 8.9 - 89.6 mg/dL   GFR, Estimated 26 (L) >60 mL/min   Anion gap 9 5 - 15  CBC   Collection Time: 05/31/24   8:30 AM  Result Value Ref Range   WBC 18.8 (H) 4.0 - 10.5 K/uL   RBC 3.78 (L) 3.87 - 5.11 MIL/uL   Hemoglobin 11.8 (L) 12.0 - 15.0 g/dL   HCT 64.7 (L) 63.9 - 53.9 %   MCV 93.1 80.0 - 100.0 fL   MCH 31.2 26.0 - 34.0 pg   MCHC 33.5 30.0 - 36.0 g/dL   RDW 85.7 88.4 - 84.4 %   Platelets 500 (H) 150 - 400 K/uL   nRBC 0.0 0.0 - 0.2 %  Basic metabolic panel   Collection Time: 05/31/24  8:30 AM  Result Value Ref Range   Sodium 137 135 - 145 mmol/L   Potassium 3.7 3.5 - 5.1 mmol/L   Chloride 109 98 - 111 mmol/L   CO2 24 22 - 32 mmol/L   Glucose, Bld 105 (H) 70 - 99 mg/dL   BUN 37 (H) 8 - 23 mg/dL   Creatinine, Ser 8.62 (H) 0.44 - 1.00 mg/dL   Calcium 9.0 8.9 - 89.6 mg/dL   GFR, Estimated 37 (L) >60 mL/min   Anion gap 4 (L) 5 - 15  Magnesium   Collection Time: 05/31/24  8:30 AM  Result Value Ref Range   Magnesium 1.7 1.7 - 2.4 mg/dL    Assessment and Plan  Peripheral edema Assessment & Plan: Acute, most likely edema is due to patient's recent decreased physical activity and IV fluids in hospital.  I encouraged her to elevate her feet above her heart and to get back to physical activity is much as able.  We will reevaluate with a complete metabolic panel BMP and CBC to make sure there is no clear secondary cause of worsening edema.  Orders: -     Brain natriuretic peptide -     CBC with Differential/Platelet -     Comprehensive metabolic panel with GFR  Balance problem Assessment & Plan: Acute, improving Encouraged physical therapy for continued strengthening and mobility.   Urinary incontinence, unspecified type Assessment & Plan: Small leakage on the way to the office today.  No other urinary symptoms We will continue to follow it and let me know if we need to repeat urinalysis.  Of note patient used the bathroom before urinalysis could be obtained in office.   Essential hypertension, benign Assessment & Plan: Chronic, given recent dehydration and decreased renal  function losartan  has been held and blood pressure continues to be doing well.   Blood pressure needs to be continued at [facility] either by patient/family or home health.   Acute kidney injury superimposed on chronic kidney disease (HCC) Assessment & Plan: Now back to normal p.o. and liquid intake.  Encourage patient to keep up with liquids. Will reevaluate kidney function in office today.   Chronic diarrhea Assessment & Plan: Chronic, back at baseline.  Continue Lomotil .   Other orders -     dilTIAZem  HCl ER Coated Beads; Take 1 capsule (240 mg total) by mouth daily.  Dispense: 90 capsule; Refill: 3    No follow-ups on file.   Greig Ring, MD

## 2024-06-08 NOTE — Assessment & Plan Note (Signed)
 Chronic, back at baseline.  Continue Lomotil .

## 2024-06-08 NOTE — Assessment & Plan Note (Signed)
 Acute, improving Encouraged physical therapy for continued strengthening and mobility.

## 2024-06-08 NOTE — Assessment & Plan Note (Signed)
 Chronic, given recent dehydration and decreased renal function losartan  has been held and blood pressure continues to be doing well.   Blood pressure needs to be continued at [facility] either by patient/family or home health.

## 2024-06-09 LAB — COMPREHENSIVE METABOLIC PANEL WITH GFR
AG Ratio: 1.2 (calc) (ref 1.0–2.5)
ALT: 15 U/L (ref 6–29)
AST: 12 U/L (ref 10–35)
Albumin: 3.5 g/dL — ABNORMAL LOW (ref 3.6–5.1)
Alkaline phosphatase (APISO): 73 U/L (ref 37–153)
BUN/Creatinine Ratio: 20 (calc) (ref 6–22)
BUN: 20 mg/dL (ref 7–25)
CO2: 28 mmol/L (ref 20–32)
Calcium: 9.7 mg/dL (ref 8.6–10.4)
Chloride: 104 mmol/L (ref 98–110)
Creat: 1 mg/dL — ABNORMAL HIGH (ref 0.60–0.95)
Globulin: 3 g/dL (ref 1.9–3.7)
Glucose, Bld: 102 mg/dL — ABNORMAL HIGH (ref 65–99)
Potassium: 5.5 mmol/L — ABNORMAL HIGH (ref 3.5–5.3)
Sodium: 140 mmol/L (ref 135–146)
Total Bilirubin: 0.3 mg/dL (ref 0.2–1.2)
Total Protein: 6.5 g/dL (ref 6.1–8.1)
eGFR: 54 mL/min/1.73m2 — ABNORMAL LOW (ref >=60)

## 2024-06-09 LAB — CBC WITH DIFFERENTIAL/PLATELET
Absolute Lymphocytes: 1208 {cells}/uL (ref 850–3900)
Absolute Monocytes: 986 {cells}/uL — ABNORMAL HIGH (ref 200–950)
Basophils Absolute: 74 {cells}/uL (ref 0–200)
Basophils Relative: 0.7 %
Eosinophils Absolute: 265 {cells}/uL (ref 15–500)
Eosinophils Relative: 2.5 %
HCT: 39 % (ref 35.0–45.0)
Hemoglobin: 12.7 g/dL (ref 11.7–15.5)
MCH: 30.7 pg (ref 27.0–33.0)
MCHC: 32.6 g/dL (ref 32.0–36.0)
MCV: 94.2 fL (ref 80.0–100.0)
MPV: 10 fL (ref 7.5–12.5)
Monocytes Relative: 9.3 %
Neutro Abs: 8067 {cells}/uL — ABNORMAL HIGH (ref 1500–7800)
Neutrophils Relative %: 76.1 %
Platelets: 629 Thousand/uL — ABNORMAL HIGH (ref 140–400)
RBC: 4.14 Million/uL (ref 3.80–5.10)
RDW: 12.7 % (ref 11.0–15.0)
Total Lymphocyte: 11.4 %
WBC: 10.6 Thousand/uL (ref 3.8–10.8)

## 2024-06-09 LAB — BRAIN NATRIURETIC PEPTIDE: Brain Natriuretic Peptide: 91 pg/mL (ref <100)

## 2024-06-12 ENCOUNTER — Ambulatory Visit: Payer: Self-pay | Admitting: Family Medicine

## 2024-06-21 NOTE — Telephone Encounter (Signed)
 Copied from CRM #[id number]. Topic: Clinical - Lab/Test Results >> Jun 21, 2024  5:00 PM Sasha M wrote: Reason for CRM: pt daughter Suzen called in to get some clarity about her mothers medications. Due to recent test results they were told that the provider would re-evaluate her meds she is currently taking. Daughter needs to find out if she should continue moms meds as current or if a new appt needs to be scheduled before she runs out of meds. Please call to discuss results and medication at [phone]. Also note that I added a [facility] pharmacy where all meds from Dr [name] should now go please.

## 2024-06-21 NOTE — Telephone Encounter (Signed)
 Copied from CRM [id number]. Topic: Clinical - Medication Question >> Jun 21, 2024  2:21 PM Jasmin G wrote: Reason for CRM: Pt's daughter, Ms. [name] Rubin called regarding recent blood test done for pt, she states that blood work done was to see what meds would pt need to keep taking but didn't hear anything back after testing, please call her back at [phone] to discuss.

## 2024-06-21 NOTE — Telephone Encounter (Signed)
 Spoke with [name] and discussed lab results and Dr. Sherrel comments from 06/08/24.  [name] is wanting to know what medications does. [name] needs to continue taking on a daily basis.  She is almost out of her medication so if refills are needed, they need to be sent to [facility] on [address]  Please advise.

## 2024-06-22 ENCOUNTER — Other Ambulatory Visit: Payer: Self-pay | Admitting: Family Medicine

## 2024-06-22 ENCOUNTER — Telehealth: Payer: Self-pay | Admitting: Family Medicine

## 2024-06-22 MED ORDER — DILTIAZEM HCL ER COATED BEADS 240 MG PO CP24: 240.0000 mg | ORAL_CAPSULE | Freq: Every day | ORAL | 3 refills | Status: DC

## 2024-06-22 NOTE — Telephone Encounter (Signed)
 As long as her blood pressure is less than 140/90 the only medications she needs to be on are on her current list.  Aspirin  81 mg p.o. daily, diltiazem  240 mg p.o. daily, Imodium  and opium  tincture as needed.  If her blood pressure is running greater than 140/90 we will need to restart her losartan .  It looks like I already sent a refill of her diltiazem  to the pharmacy on June 08, 1989 with 3 refills

## 2024-06-22 NOTE — Telephone Encounter (Signed)
 Returned Kim's call.  She wanted to know what her mom was taking the diltiazem  for and how long she has been taking it.  I advised it was for blood pressure and as far as I can see she has been taking it since 2013.  Nothing further is needed at this time.

## 2024-06-22 NOTE — Telephone Encounter (Signed)
 Copied from CRM [id number]. Topic: Clinical - Medication Question >> Jun 22, 2024  1:49 PM Terri G wrote: Reason for CRM: Suzen (patients daughter) was calling back cause she wanted to know who had called her around 11:39 today and can they call back. Callback number  [phone]

## 2024-06-25 ENCOUNTER — Inpatient Hospital Stay
Admission: EM | Admit: 2024-06-25 | Discharge: 2024-06-28 | DRG: 393 | Disposition: A | Discharge status: Home Health | Source: Skilled Nursing Facility | Attending: Internal Medicine | Admitting: Internal Medicine

## 2024-06-25 ENCOUNTER — Other Ambulatory Visit: Payer: Self-pay

## 2024-06-25 ENCOUNTER — Emergency Department

## 2024-06-25 DIAGNOSIS — B9620 Unspecified Escherichia coli [E. coli] as the cause of diseases classified elsewhere: Secondary | ICD-10-CM | POA: Diagnosis present

## 2024-06-25 DIAGNOSIS — K6289 Other specified diseases of anus and rectum: Secondary | ICD-10-CM | POA: Diagnosis not present

## 2024-06-25 DIAGNOSIS — Z79899 Other long term (current) drug therapy: Secondary | ICD-10-CM

## 2024-06-25 DIAGNOSIS — K638219 Small intestinal bacterial overgrowth, unspecified: Secondary | ICD-10-CM | POA: Diagnosis not present

## 2024-06-25 DIAGNOSIS — Z8261 Family history of arthritis: Secondary | ICD-10-CM

## 2024-06-25 DIAGNOSIS — E876 Hypokalemia: Secondary | ICD-10-CM | POA: Diagnosis not present

## 2024-06-25 DIAGNOSIS — Z87440 Personal history of urinary (tract) infections: Secondary | ICD-10-CM

## 2024-06-25 DIAGNOSIS — Z9049 Acquired absence of other specified parts of digestive tract: Secondary | ICD-10-CM

## 2024-06-25 DIAGNOSIS — R109 Unspecified abdominal pain: Secondary | ICD-10-CM | POA: Diagnosis not present

## 2024-06-25 DIAGNOSIS — K639 Disease of intestine, unspecified: Secondary | ICD-10-CM | POA: Diagnosis not present

## 2024-06-25 DIAGNOSIS — Z923 Personal history of irradiation: Secondary | ICD-10-CM

## 2024-06-25 DIAGNOSIS — M810 Age-related osteoporosis without current pathological fracture: Secondary | ICD-10-CM | POA: Diagnosis not present

## 2024-06-25 DIAGNOSIS — Z823 Family history of stroke: Secondary | ICD-10-CM

## 2024-06-25 DIAGNOSIS — D72829 Elevated white blood cell count, unspecified: Secondary | ICD-10-CM | POA: Insufficient documentation

## 2024-06-25 DIAGNOSIS — Z85048 Personal history of other malignant neoplasm of rectum, rectosigmoid junction, and anus: Secondary | ICD-10-CM

## 2024-06-25 DIAGNOSIS — Z818 Family history of other mental and behavioral disorders: Secondary | ICD-10-CM

## 2024-06-25 DIAGNOSIS — E785 Hyperlipidemia, unspecified: Secondary | ICD-10-CM | POA: Diagnosis present

## 2024-06-25 DIAGNOSIS — Z8673 Personal history of transient ischemic attack (TIA), and cerebral infarction without residual deficits: Secondary | ICD-10-CM

## 2024-06-25 DIAGNOSIS — R636 Underweight: Secondary | ICD-10-CM | POA: Diagnosis present

## 2024-06-25 DIAGNOSIS — Z9221 Personal history of antineoplastic chemotherapy: Secondary | ICD-10-CM | POA: Diagnosis not present

## 2024-06-25 DIAGNOSIS — Z7982 Long term (current) use of aspirin: Secondary | ICD-10-CM | POA: Diagnosis not present

## 2024-06-25 DIAGNOSIS — N390 Urinary tract infection, site not specified: Secondary | ICD-10-CM | POA: Insufficient documentation

## 2024-06-25 DIAGNOSIS — R197 Diarrhea, unspecified: Secondary | ICD-10-CM | POA: Diagnosis not present

## 2024-06-25 DIAGNOSIS — N83201 Unspecified ovarian cyst, right side: Secondary | ICD-10-CM | POA: Diagnosis present

## 2024-06-25 DIAGNOSIS — R4182 Altered mental status, unspecified: Secondary | ICD-10-CM | POA: Diagnosis present

## 2024-06-25 DIAGNOSIS — Z681 Body mass index (BMI) 19 or less, adult: Secondary | ICD-10-CM | POA: Diagnosis not present

## 2024-06-25 DIAGNOSIS — G9341 Metabolic encephalopathy: Secondary | ICD-10-CM | POA: Diagnosis not present

## 2024-06-25 DIAGNOSIS — F32A Depression, unspecified: Secondary | ICD-10-CM | POA: Diagnosis present

## 2024-06-25 DIAGNOSIS — N281 Cyst of kidney, acquired: Secondary | ICD-10-CM | POA: Diagnosis not present

## 2024-06-25 DIAGNOSIS — I10 Essential (primary) hypertension: Secondary | ICD-10-CM | POA: Diagnosis not present

## 2024-06-25 DIAGNOSIS — N2889 Other specified disorders of kidney and ureter: Secondary | ICD-10-CM | POA: Diagnosis not present

## 2024-06-25 DIAGNOSIS — Z1152 Encounter for screening for COVID-19: Secondary | ICD-10-CM

## 2024-06-25 LAB — COMPREHENSIVE METABOLIC PANEL WITH GFR
ALT: 14 U/L (ref 0–44)
AST: 20 U/L (ref 15–41)
Albumin: 3.2 g/dL — ABNORMAL LOW (ref 3.5–5.0)
Alkaline Phosphatase: 70 U/L (ref 38–126)
Anion gap: 12 (ref 5–15)
BUN: 22 mg/dL (ref 8–23)
CO2: 27 mmol/L (ref 22–32)
Calcium: 9.7 mg/dL (ref 8.9–10.3)
Chloride: 100 mmol/L (ref 98–111)
Creatinine, Ser: 0.77 mg/dL (ref 0.44–1.00)
GFR, Estimated: >60 mL/min (ref >60)
Glucose, Bld: 98 mg/dL (ref 70–99)
Potassium: 4 mmol/L (ref 3.5–5.1)
Sodium: 139 mmol/L (ref 135–145)
Total Bilirubin: 1 mg/dL (ref 0.0–1.2)
Total Protein: 5.9 g/dL — ABNORMAL LOW (ref 6.5–8.1)

## 2024-06-25 LAB — CBC
HCT: 42.9 % (ref 36.0–46.0)
Hemoglobin: 14.2 g/dL (ref 12.0–15.0)
MCH: 30.8 pg (ref 26.0–34.0)
MCHC: 33.1 g/dL (ref 30.0–36.0)
MCV: 93.1 fL (ref 80.0–100.0)
Platelets: 311 K/uL (ref 150–400)
RBC: 4.61 MIL/uL (ref 3.87–5.11)
RDW: 15.7 % — ABNORMAL HIGH (ref 11.5–15.5)
WBC: 14.2 K/uL — ABNORMAL HIGH (ref 4.0–10.5)
nRBC: 0 % (ref 0.0–0.2)

## 2024-06-25 LAB — URINALYSIS, COMPLETE (UACMP) WITH MICROSCOPIC
Bilirubin Urine: NEGATIVE
Glucose, UA: NEGATIVE mg/dL
Hgb urine dipstick: NEGATIVE
Ketones, ur: 20 mg/dL — AB
Nitrite: NEGATIVE
Protein, ur: NEGATIVE mg/dL
Specific Gravity, Urine: 1.01 (ref 1.005–1.030)
WBC, UA: >50 WBC/hpf (ref 0–5)
pH: 7 (ref 5.0–8.0)

## 2024-06-25 LAB — RESP PANEL BY RT-PCR (RSV, FLU A&B, COVID)  RVPGX2
Influenza A by PCR: NEGATIVE
Influenza B by PCR: NEGATIVE
Resp Syncytial Virus by PCR: NEGATIVE
SARS Coronavirus 2 by RT PCR: NEGATIVE

## 2024-06-25 LAB — C DIFFICILE QUICK SCREEN W PCR REFLEX
C Diff antigen: NEGATIVE
C Diff interpretation: NOT DETECTED
C Diff toxin: NEGATIVE

## 2024-06-25 LAB — GASTROINTESTINAL PANEL BY PCR, STOOL (REPLACES STOOL CULTURE)

## 2024-06-25 MED ORDER — ACETAMINOPHEN 325 MG PO TABS
650.0000 mg | ORAL_TABLET | Freq: Four times a day (QID) | ORAL | Status: DC | PRN
Start: 2024-06-25 — End: 2024-06-30

## 2024-06-25 MED ORDER — ONDANSETRON HCL 4 MG PO TABS: 4.0000 mg | ORAL_TABLET | Freq: Four times a day (QID) | ORAL | Status: DC | PRN

## 2024-06-25 MED ORDER — ONDANSETRON HCL 4 MG/2ML IJ SOLN: 4.0000 mg | Freq: Four times a day (QID) | INTRAMUSCULAR | Status: DC | PRN

## 2024-06-25 MED ORDER — HEPARIN SODIUM (PORCINE) 5000 UNIT/ML IJ SOLN
5000.0000 [IU] | Freq: Three times a day (TID) | INTRAMUSCULAR | Status: DC
  Administered 2024-06-25 – 2024-06-28 (×9): 5000 [IU] via SUBCUTANEOUS
  Filled 2024-06-25 (×9): qty 1

## 2024-06-25 MED ORDER — HYDRALAZINE HCL 20 MG/ML IJ SOLN: 5.0000 mg | Freq: Four times a day (QID) | INTRAMUSCULAR | Status: DC | PRN

## 2024-06-25 MED ORDER — SODIUM CHLORIDE 0.9 % IV SOLN
2.0000 g | Freq: Once | INTRAVENOUS | Status: AC
  Administered 2024-06-25: 2 g via INTRAVENOUS
  Filled 2024-06-25: qty 20

## 2024-06-25 MED ORDER — ACETAMINOPHEN 650 MG RE SUPP: 650.0000 mg | Freq: Four times a day (QID) | RECTAL | Status: DC | PRN

## 2024-06-25 MED ORDER — SODIUM CHLORIDE 0.9 % IV BOLUS
1000.0000 mL | Freq: Once | INTRAVENOUS | Status: AC
  Administered 2024-06-25: 1000 mL via INTRAVENOUS

## 2024-06-25 MED ORDER — LACTATED RINGERS IV SOLN: INTRAVENOUS | Status: DC

## 2024-06-25 MED ORDER — IOHEXOL 300 MG/ML  SOLN
100.0000 mL | Freq: Once | INTRAMUSCULAR | Status: AC | PRN
  Administered 2024-06-25: 100 mL via INTRAVENOUS

## 2024-06-25 MED ORDER — SODIUM CHLORIDE 0.9 % IV SOLN
2.0000 g | INTRAVENOUS | Status: DC
  Administered 2024-06-26: 2 g via INTRAVENOUS
  Filled 2024-06-25: qty 20

## 2024-06-25 MED ORDER — DILTIAZEM HCL ER COATED BEADS 120 MG PO CP24
240.0000 mg | ORAL_CAPSULE | Freq: Every day | ORAL | Status: DC
  Administered 2024-06-26 – 2024-06-28 (×3): 240 mg via ORAL
  Filled 2024-06-25 (×3): qty 2

## 2024-06-25 MED ORDER — LOPERAMIDE HCL 2 MG PO CAPS
2.0000 mg | ORAL_CAPSULE | Freq: Four times a day (QID) | ORAL | Status: DC | PRN
  Administered 2024-06-26 – 2024-06-28 (×3): 2 mg via ORAL
  Filled 2024-06-25 (×3): qty 1

## 2024-06-25 MED ORDER — LOPERAMIDE HCL 2 MG PO CAPS
4.0000 mg | ORAL_CAPSULE | Freq: Once | ORAL | Status: AC
  Administered 2024-06-25: 4 mg via ORAL
  Filled 2024-06-25: qty 2

## 2024-06-25 MED ORDER — ASPIRIN 81 MG PO TBEC
81.0000 mg | DELAYED_RELEASE_TABLET | Freq: Every day | ORAL | Status: DC
  Administered 2024-06-26 – 2024-06-28 (×3): 81 mg via ORAL
  Filled 2024-06-25 (×3): qty 1

## 2024-06-25 NOTE — ED Provider Notes (Signed)
 Patient received in signout from Dr. Dorothyann.  Recurrent diarrhea, admission at [facility] about 1 month ago for the same.  UA here concerning for cystitis and sent for culture.  Urine culture with E. coli from 1 month ago, sensitive to cephalosporin.  GI panel and C. difficile are negative, CT scan without obstructive features.  Discussed the possibility of colonic mass at the hepatic flexure with the patient and her daughter.  Discussed plan of care and they would prefer to stay for observation which is reasonable.  Will consult with medicine for admission.   Claudene Rover, MD 06/25/24 [phone]

## 2024-06-25 NOTE — Assessment & Plan Note (Addendum)
 Symptomatic support Continue with ceftriaxone  2 g IV daily to complete a 5-day course Status post sodium chloride  2 L bolus per EDP Continue with LR infusion at 125 mL/h, 1 day ordered GI panel and C. Difficile were negative As needed loperamide  2 mg prn for diarrhea

## 2024-06-25 NOTE — ED Notes (Signed)
 Pt sleeping.

## 2024-06-25 NOTE — ED Provider Notes (Signed)
 [facility] Provider Note    Event Date/Time   First MD Initiated Contact with Patient 06/25/24 1223     (approximate)  History   Chief Complaint: Diarrhea  HPI  [name] is a 89 y.o. female with a past medical history of rectal cancer status postsurgery/chemotherapy, presents to the emergency department for diarrhea.  According to EMS patient is coming from home lives with her daughter.  For the last 3 days patient has been eating and drinking very little saying that she did not feel good.  Since last night patient has been experiencing diarrhea and complaining of some abdominal discomfort.  Here the patient denies any abdominal pain.  Denies any fever.  Denies any nausea or vomiting.  Physical Exam   Triage Vital Signs: ED Triage Vitals  Encounter Vitals Group     BP 06/25/24 1232 (!) 180/92     Girls Systolic BP Percentile --      Girls Diastolic BP Percentile --      Boys Systolic BP Percentile --      Boys Diastolic BP Percentile --      Pulse Rate 06/25/24 1232 88     Resp 06/25/24 1232 18     Temp 06/25/24 1232 98.3 F (36.8 C)     Temp Source 06/25/24 1232 Oral     SpO2 06/25/24 1232 100 %     Weight 06/25/24 1230 97 lb (44 kg)     Height 06/25/24 1230 5' 4 (1.626 m)     Head Circumference --      Peak Flow --      Pain Score 06/25/24 1229 0     Pain Loc --      Pain Education --      Exclude from Growth Chart --     Most recent vital signs: Vitals:   06/25/24 1232  BP: (!) 180/92  Pulse: 88  Resp: 18  Temp: 98.3 F (36.8 C)  SpO2: 100%    General: Awake, no distress.  CV:  Good peripheral perfusion.  Regular rate and rhythm  Resp:  Normal effort.  Equal breath sounds bilaterally.  Abd:  No distention.  Soft, nontender.  No rebound or guarding.  Benign abdomen.  ED Results / Procedures / Treatments   EKG  EKG viewed and interpreted by myself shows a sinus rhythm 89 bpm with a narrow QRS, normal axis, normal  intervals, no concerning ST changes.  RADIOLOGY  CT pending   MEDICATIONS ORDERED IN ED: Medications  sodium chloride  0.9 % bolus 1,000 mL (has no administration in time range)     IMPRESSION / MDM / ASSESSMENT AND PLAN / ED COURSE  I reviewed the triage vital signs and the nursing notes.  Patient's presentation is most consistent with acute presentation with potential threat to life or bodily function.  Patient presents emergency department for diarrhea and decreased oral intake over the last 2 to 3 days, now with diarrhea overnight last night and this morning.  Per report patient was complaining of abdominal pain last night, denies any currently.  However given the report we will check labs, will IV hydrate we will obtain CT imaging of the abdomen and pelvis to further evaluate.  On arrival patient is covered with stool.  Patient's workup today shows mild leukocytosis otherwise reassuring CBC, chemistry overall appears well anion gap of 12.  Patient has received IV fluids after the chemistry was drawn.  Patient's respiratory panel is negative.  Patient's stool studies have been sent including a C. difficile and a BioFire given the patient's significant amount of diarrhea.  Has had 3 episodes in the emergency department.  Patient has received Imodium  and her bowel movements have slowed.  Daughter states similar event occurring 3 weeks ago had to be admitted at that time due to dehydration.  CT scan and stool studies pending.  FINAL CLINICAL IMPRESSION(S) / ED DIAGNOSES   Diarrhea    Note:  This document was prepared using Dragon voice recognition software and may include unintentional dictation errors.   [name], MD 06/25/24 1436

## 2024-06-25 NOTE — ED Notes (Signed)
 Daughter out to nurses station asking for information. Explained that we are still investigating the cause of pt's diarrhea.

## 2024-06-25 NOTE — Assessment & Plan Note (Signed)
 Home diltiazem  to 40 mg p.o. daily resumed Hydralazine  5 mg IV every 6 hours as needed for SBP greater 165, 5 days ordered

## 2024-06-25 NOTE — ED Notes (Signed)
 ED tech helped pt get up and walk to toilet to void. Pt back in bed.

## 2024-06-25 NOTE — ED Notes (Signed)
 EDP at bedside w pt and family.

## 2024-06-25 NOTE — Assessment & Plan Note (Signed)
 Ceftriaxone  2 g IV daily, to complete 5-day course Urine culture in process

## 2024-06-25 NOTE — ED Notes (Signed)
 Several IV attempts by 2 nurses. Now going to do u/s IV.

## 2024-06-25 NOTE — Assessment & Plan Note (Signed)
 Urine culture is in process, continue with ceftriaxone  2 g IV daily Recheck CBC in a.m.

## 2024-06-25 NOTE — ED Notes (Signed)
 EDP at bedside talking to pt. Pt does not remember having 5 diarrheal episodes here today.

## 2024-06-25 NOTE — ED Notes (Signed)
 Informed pharmacy tech that daughter would like to do med req before she leaves.

## 2024-06-25 NOTE — H&P (Signed)
 History and Physical   [name] FMW:[id number] DOB: December 14, 1932 DOA: 06/25/2024  PCP: [name] Greig BRAVO, MD  Patient coming from: home via EMS  I have personally briefly reviewed patient's old medical records in [facility] EMR.  Chief Concern: diarrhea, weakness, poor po intake  HPI: [name] is an 89 year old female with history of rectal cancer status postsurgery and chemotherapy, prior UTI, hyperlipidemia, hypertension, who presents emergency department for chief concerns of diarrhea, poor p.o. intake.  Vitals in the ED showed T of 98.3, rr 15, hr 85, blood pressure 165/90, SpO2 99% on room air.  Serum sodium is 139, potassium 4.0, chloride 100, bicarb 27, BUN of 22, serum creatinine 0.77, EGFR greater than 60, nonfasting glucose 98, WBC 14.2, hemoglobin 14.2, platelets of 311.  ED treatment: Imodium  4 mg p.o. one-time dose, ceftriaxone  2 g IV one-time dose, sodium chloride  1 L bolus x 2. --------------------------------------- At bedside, patient able to tell me her first and last name, and her current location.  She was not able to tell me her age, current calendar year, current month.  She was able to tell me her birthday.  She reports she is in the hospital because her stomach started hurting today.  She was not able to tell me anything else.  Daughter states patient has not been eating and drinking water for about 2 days.  Social history: Patient lives in assisted living, [facility] in [place].   ROS: Unable to complete due to mentation, suspect baseline dementia  ED Course: Discussed with EDP, patient requiring hospitalization for chief concerns of weakness, UTI and diarrhea.  Assessment/Plan  Principal Problem:   Proctitis Active Problems:   HLD (hyperlipidemia)   Essential hypertension, benign   Altered mental state   UTI (urinary tract infection)   Leukocytosis   Assessment and Plan:  * Proctitis Symptomatic support Continue with ceftriaxone  2 g IV  daily to complete a 5-day course Status post sodium chloride  2 L bolus per EDP Continue with LR infusion at 125 mL/h, 1 day ordered GI panel and C. Difficile were negative As needed loperamide  2 mg prn for diarrhea   Leukocytosis Urine culture is in process, continue with ceftriaxone  2 g IV daily Recheck CBC in a.m.  UTI (urinary tract infection) Ceftriaxone  2 g IV daily, to complete 5-day course Urine culture in process  Essential hypertension, benign Home diltiazem  to 40 mg p.o. daily resumed Hydralazine  5 mg IV every 6 hours as needed for SBP greater 165, 5 days ordered  Chart reviewed.   DVT prophylaxis: Heparin  5000 units subcutaneous every 8 hours Code Status: full code, confirmed with daughter Diet: Heart healthy Family Communication: updated daughter [name] Rubin, over the phone at [phone]. Disposition Plan: pending clinical course Consults called: None at this time Admission status: med-surg, obs   Past Medical History:  Diagnosis Date   Allergic rhinitis, cause unspecified    Diarrhea    Edema    History of colon cancer    s/p surgery, chemotherapy   Internal hemorrhoids without mention of complication    Malignant neoplasm of rectum (HCC)    Osteoarthrosis, unspecified whether generalized or localized, unspecified site    Osteoporosis, unspecified    Other acute reactions to stress    Other and unspecified hyperlipidemia    Other screening mammogram    Pain in limb    Special screening for osteoporosis    Ulceration of vulva, unspecified    Unspecified essential hypertension    Past  Surgical History:  Procedure Laterality Date   dental implants     PARTIAL COLECTOMY  9/10   temporary colostomy   Social History:  reports that she has never smoked. She has never used smokeless tobacco. She reports current drug use. Drug: Opium . She reports that she does not drink alcohol.  No Known Allergies Family History  Problem Relation Age of Onset   Stroke  Mother    Arthritis Mother    Arthritis Brother    Depression Sister    Family history: Family history reviewed and not pertinent.  Prior to Admission medications   Medication Sig Start Date End Date Taking? Authorizing Provider  aspirin  EC 81 MG EC tablet Take 1 tablet (81 mg total) by mouth daily. Swallow whole. 06/17/21  Yes Dennise Lavada POUR, MD  diltiazem  (CARDIZEM  CD) 240 MG 24 hr capsule Take 1 capsule (240 mg total) by mouth daily. 06/22/24  Yes Bedsole, Willia Lampert E, MD  loperamide  (IMODIUM  A-D) 2 MG tablet Take 1 tablet (2 mg total) by mouth 4 (four) times daily as needed for diarrhea or loose stools. 05/31/24  Yes Arlon Carliss ORN, DO  Multiple Vitamin (MULTIVITAMIN ADULT PO) Take by mouth.   Yes [provider]  Opium  10 MG/ML (1%) TINC Please take 0.2-0.4 mL three times per day 12/25/18  Yes [provider]   Physical Exam: Vitals:   06/25/24 1700 06/25/24 1706 06/25/24 1730 06/25/24 1841  BP: (!) 149/82  (!) 160/82 (!) 168/76  Pulse: 88  82 76  Resp:   18 17  Temp:  98 F (36.7 C)  98.6 F (37 C)  TempSrc:  Axillary  Oral  SpO2: 96%  100% 100%  Weight:      Height:       Constitutional: appears age-appropriate, frail, weak, NAD, calm Eyes: PERRL, lids and conjunctivae normal ENMT: Mucous membranes are moist. Posterior pharynx clear of any exudate or lesions. Age-appropriate dentition. Hearing appropriate Neck: normal, supple, no masses, no thyromegaly Respiratory: clear to auscultation bilaterally, no wheezing, no crackles. Normal respiratory effort. No accessory muscle use.  Cardiovascular: Regular rate and rhythm, no murmurs / rubs / gallops. No extremity edema. 2+ pedal pulses. No carotid bruits.  Abdomen: no tenderness, no masses palpated, no hepatosplenomegaly. Bowel sounds positive.  Musculoskeletal: no clubbing / cyanosis. No joint deformity upper and lower extremities. Good ROM, no contractures, no atrophy. Normal muscle tone.  Skin: no rashes, lesions,  ulcers. No induration Neurologic: Sensation intact. Strength 5/5 in all 4.  Psychiatric: Normal judgment and insight. Alert and oriented x 3. Normal mood.   EKG: independently reviewed, showing sinus rhythm with rate of 89, QTc 424  Chest x-ray on Admission: I personally reviewed and I agree with radiologist reading as below.  CT ABDOMEN PELVIS W CONTRAST Result Date: 06/25/2024 CLINICAL DATA:  Abdominal pain EXAM: CT ABDOMEN AND PELVIS WITH CONTRAST TECHNIQUE: Multidetector CT imaging of the abdomen and pelvis was performed using the standard protocol following bolus administration of intravenous contrast. RADIATION DOSE REDUCTION: This exam was performed according to the departmental dose-optimization program which includes automated exposure control, adjustment of the mA and/or kV according to patient size and/or use of iterative reconstruction technique. CONTRAST:  OMNIPAQUE  IOHEXOL  300 MG/ML  SOLN COMPARISON:  10/27/2020. FINDINGS: Lower chest: Linear bibasilar scarring or subsegmental atelectasis. No pleural or pericardial effusions. Hepatobiliary: Subcentimeter hypodensity right lobe is consistent with a cyst. There is a central biliary ductal dilatation status post cholecystectomy. Pancreas: Unremarkable. No pancreatic ductal  dilatation or surrounding inflammatory changes. Spleen: Normal in size without focal abnormality. Adrenals/Urinary Tract: Stable findings noted as follows: Numerous bilateral renal cysts. Dystrophic renal parenchymal calcification on the right. Left-sided extrarenal pelvis and parapelvic renal cysts. No hydronephrosis. Unremarkable urinary bladder. Stomach/Bowel: Anastomosis in the right colon and rectosigmoid. No bowel dilatation to suggest obstruction. Rectal wall thickening and perirectal and presacral fat stranding could indicate proctitis. In the hepatic flexure region there is mucosal thickening with an appearance suggesting possible apple-core type colonic lesion  which could be better evaluated endoscopically if indicated. Vascular/Lymphatic: Aortic atherosclerosis. No enlarged abdominal or pelvic lymph nodes. Reproductive: Cystic structure in the cul-de-sac on the right was present in 2022. There is a larger cystic structure in the right adnexa measuring 3 point which could be an ovarian cyst. This might be better assessed with pelvic ultrasound. Other: No abdominal wall hernia or abnormality. No abdominopelvic ascites. Musculoskeletal: Osteopenia. L5-S1 degenerative disc disease and grade 1 L5 anterolisthesis. IMPRESSION: 1. Rectal wall thickening and perirectal fat stranding consistent with proctitis. 2. Possible right-sided colonic lesion in the hepatic flexure. This could be better evaluated endoscopically. 3. Cystic structures in the right adnexa and cul-de-sac. Consider pelvic ultrasound. 4. Aortic atherosclerosis (ICD10-I70.0). Electronically Signed   By: Fonda Field M.D.   On: 06/25/2024 16:48   Labs on Admission: I have personally reviewed following labs  CBC: Recent Labs  Lab 06/25/24 1230  WBC 14.2*  HGB 14.2  HCT 42.9  MCV 93.1  PLT 311   Basic Metabolic Panel: Recent Labs  Lab 06/25/24 1311  NA 139  K 4.0  CL 100  CO2 27  GLUCOSE 98  BUN 22  CREATININE 0.77  CALCIUM 9.7   GFR: Estimated Creatinine Clearance: 33.1 mL/min (by C-G formula based on SCr of 0.77 mg/dL).  Liver Function Tests: Recent Labs  Lab 06/25/24 1311  AST 20  ALT 14  ALKPHOS 70  BILITOT 1.0  PROT 5.9*  ALBUMIN 3.2*   Urine analysis:    Component Value Date/Time   COLORURINE YELLOW (A) 06/25/2024 1545   APPEARANCEUR CLOUDY (A) 06/25/2024 1545   APPEARANCEUR Hazy 03/31/2013 2230   LABSPEC 1.010 06/25/2024 1545   LABSPEC 1.019 03/31/2013 2230   PHURINE 7.0 06/25/2024 1545   GLUCOSEU NEGATIVE 06/25/2024 1545   GLUCOSEU Negative 03/31/2013 2230   HGBUR NEGATIVE 06/25/2024 1545   HGBUR negative 06/15/2010 1527   BILIRUBINUR NEGATIVE  06/25/2024 1545   BILIRUBINUR Negative 01/24/2019 1216   BILIRUBINUR Negative 03/31/2013 2230   KETONESUR 20 (A) 06/25/2024 1545   PROTEINUR NEGATIVE 06/25/2024 1545   UROBILINOGEN 0.2 01/24/2019 1216   UROBILINOGEN 0.2 06/15/2010 1527   NITRITE NEGATIVE 06/25/2024 1545   LEUKOCYTESUR LARGE (A) 06/25/2024 1545   LEUKOCYTESUR 2+ 03/31/2013 2230   This document was prepared using Dragon Voice Recognition software and may include unintentional dictation errors.  Dr. Sherre [facility]  If 7PM-7AM, please contact overnight-coverage provider If 7AM-7PM, please contact day attending provider [email or web address]  06/25/2024, 7:35 PM

## 2024-06-25 NOTE — Hospital Course (Signed)
 Mr. [name] is an 89 year old female with history of rectal cancer status postsurgery and chemotherapy, prior UTI, hyperlipidemia, hypertension, who presents emergency department for chief concerns of diarrhea, poor p.o. intake.  Vitals in the ED showed T of 98.3, rr 15, hr 85, blood pressure 165/90, SpO2 99% on room air.  Serum sodium is 139, potassium 4.0, chloride 100, bicarb 27, BUN of 22, serum creatinine 0.77, EGFR greater than 60, nonfasting glucose 98, WBC 14.2, hemoglobin 14.2, platelets of 311.  ED treatment: Imodium  4 mg p.o. one-time dose, ceftriaxone  2 g IV one-time dose, sodium chloride  1 L bolus x 2.

## 2024-06-25 NOTE — ED Triage Notes (Signed)
 To ED from home [facility] for abdominal pain and diarrhea since 1 day. Per family, some memory loss. Hasn;t wanted to eat or drink for 3 days and states doesn't feel good. Hx colon surgery, GI issues since then. Recent UTI per family.  HR 80, 194/95, 98% RA, 97.7, 112 CBG, 12 lead normal  Legs and back covered in yellow diarrhea. Cleaned pt up and placed in gown and gave blankets.

## 2024-06-25 NOTE — ED Notes (Signed)
 Daughter of pt out to nurses station asking again about the plan of care and whether the doctor has seen the pt. Informed her that EDP has seen pt and that stool sample was just sent.

## 2024-06-25 NOTE — ED Notes (Signed)
 Advised nurse that patient has ready bed

## 2024-06-25 NOTE — ED Notes (Signed)
 Family member asking RN to remove IV because fluids are done. Explained that pt still needs IV. Family member also asking RN to get pt up to walk to toilet to urinate. Pt placed on bedpan. Pt unable to urinate. Helped pt walk to toilet. Did peri care again.

## 2024-06-26 ENCOUNTER — Observation Stay

## 2024-06-26 DIAGNOSIS — Z823 Family history of stroke: Secondary | ICD-10-CM | POA: Diagnosis not present

## 2024-06-26 DIAGNOSIS — R636 Underweight: Secondary | ICD-10-CM | POA: Diagnosis present

## 2024-06-26 DIAGNOSIS — Z79899 Other long term (current) drug therapy: Secondary | ICD-10-CM | POA: Diagnosis not present

## 2024-06-26 DIAGNOSIS — Z7982 Long term (current) use of aspirin: Secondary | ICD-10-CM | POA: Diagnosis not present

## 2024-06-26 DIAGNOSIS — Z923 Personal history of irradiation: Secondary | ICD-10-CM | POA: Diagnosis not present

## 2024-06-26 DIAGNOSIS — Z9049 Acquired absence of other specified parts of digestive tract: Secondary | ICD-10-CM | POA: Diagnosis not present

## 2024-06-26 DIAGNOSIS — K639 Disease of intestine, unspecified: Secondary | ICD-10-CM | POA: Diagnosis present

## 2024-06-26 DIAGNOSIS — M810 Age-related osteoporosis without current pathological fracture: Secondary | ICD-10-CM | POA: Diagnosis present

## 2024-06-26 DIAGNOSIS — I10 Essential (primary) hypertension: Secondary | ICD-10-CM | POA: Diagnosis present

## 2024-06-26 DIAGNOSIS — N83201 Unspecified ovarian cyst, right side: Secondary | ICD-10-CM | POA: Diagnosis present

## 2024-06-26 DIAGNOSIS — N390 Urinary tract infection, site not specified: Secondary | ICD-10-CM | POA: Diagnosis present

## 2024-06-26 DIAGNOSIS — Z85048 Personal history of other malignant neoplasm of rectum, rectosigmoid junction, and anus: Secondary | ICD-10-CM | POA: Diagnosis not present

## 2024-06-26 DIAGNOSIS — Z9221 Personal history of antineoplastic chemotherapy: Secondary | ICD-10-CM | POA: Diagnosis not present

## 2024-06-26 DIAGNOSIS — Z1152 Encounter for screening for COVID-19: Secondary | ICD-10-CM | POA: Diagnosis not present

## 2024-06-26 DIAGNOSIS — E785 Hyperlipidemia, unspecified: Secondary | ICD-10-CM | POA: Diagnosis present

## 2024-06-26 DIAGNOSIS — Z87440 Personal history of urinary (tract) infections: Secondary | ICD-10-CM | POA: Diagnosis not present

## 2024-06-26 DIAGNOSIS — G9341 Metabolic encephalopathy: Secondary | ICD-10-CM | POA: Diagnosis present

## 2024-06-26 DIAGNOSIS — Z8673 Personal history of transient ischemic attack (TIA), and cerebral infarction without residual deficits: Secondary | ICD-10-CM | POA: Diagnosis not present

## 2024-06-26 DIAGNOSIS — F32A Depression, unspecified: Secondary | ICD-10-CM | POA: Diagnosis present

## 2024-06-26 DIAGNOSIS — E876 Hypokalemia: Secondary | ICD-10-CM | POA: Diagnosis present

## 2024-06-26 DIAGNOSIS — Z681 Body mass index (BMI) 19 or less, adult: Secondary | ICD-10-CM | POA: Diagnosis not present

## 2024-06-26 DIAGNOSIS — B9620 Unspecified Escherichia coli [E. coli] as the cause of diseases classified elsewhere: Secondary | ICD-10-CM | POA: Diagnosis present

## 2024-06-26 DIAGNOSIS — K6289 Other specified diseases of anus and rectum: Secondary | ICD-10-CM | POA: Diagnosis not present

## 2024-06-26 DIAGNOSIS — K638219 Small intestinal bacterial overgrowth, unspecified: Secondary | ICD-10-CM | POA: Diagnosis present

## 2024-06-26 DIAGNOSIS — Z9889 Other specified postprocedural states: Secondary | ICD-10-CM | POA: Diagnosis not present

## 2024-06-26 LAB — CBC
HCT: 34.2 % — ABNORMAL LOW (ref 36.0–46.0)
Hemoglobin: 11.2 g/dL — ABNORMAL LOW (ref 12.0–15.0)
MCH: 30.8 pg (ref 26.0–34.0)
MCHC: 32.7 g/dL (ref 30.0–36.0)
MCV: 94 fL (ref 80.0–100.0)
Platelets: 506 K/uL — ABNORMAL HIGH (ref 150–400)
RBC: 3.64 MIL/uL — ABNORMAL LOW (ref 3.87–5.11)
RDW: 15.7 % — ABNORMAL HIGH (ref 11.5–15.5)
WBC: 9.2 K/uL (ref 4.0–10.5)
nRBC: 0 % (ref 0.0–0.2)

## 2024-06-26 LAB — BASIC METABOLIC PANEL WITH GFR
Anion gap: 8 (ref 5–15)
BUN: 18 mg/dL (ref 8–23)
CO2: 26 mmol/L (ref 22–32)
Calcium: 8.6 mg/dL — ABNORMAL LOW (ref 8.9–10.3)
Chloride: 105 mmol/L (ref 98–111)
Creatinine, Ser: 0.62 mg/dL (ref 0.44–1.00)
GFR, Estimated: >60 mL/min (ref >60)
Glucose, Bld: 98 mg/dL (ref 70–99)
Potassium: 3.3 mmol/L — ABNORMAL LOW (ref 3.5–5.1)
Sodium: 139 mmol/L (ref 135–145)

## 2024-06-26 MED ORDER — MIDAZOLAM HCL 2 MG/2ML IJ SOLN: 2.0000 mg | Freq: Once | INTRAMUSCULAR | Status: DC | PRN

## 2024-06-26 MED ORDER — SODIUM CHLORIDE 0.9 % IV SOLN
1.0000 g | INTRAVENOUS | Status: DC
  Administered 2024-06-27: 1 g via INTRAVENOUS
  Filled 2024-06-26 (×2): qty 10

## 2024-06-26 MED ORDER — OPIUM 10 MG/ML (1%) PO TINC
0.2000 mL | Freq: Three times a day (TID) | ORAL | Status: DC
  Administered 2024-06-26 – 2024-06-28 (×6): 2 mg via ORAL
  Filled 2024-06-26 (×6): qty 1

## 2024-06-26 NOTE — Care Management Obs Status (Signed)
 MEDICARE OBSERVATION STATUS NOTIFICATION   Patient Details  Name: [name] MRN: [id number] Date of Birth: 1933-08-07   Medicare Observation Status Notification Given:  Yes    [name] 06/26/2024, 11:31 AM

## 2024-06-26 NOTE — Consult Note (Signed)
 [facility] GI Inpatient Consult Note   Ladell Francis Boss, M.D.  Reason for Consult: Diarrhea, possible colonic mass on CT scan.   Attending Requesting Consult:Noah Kandis, M.D.    History of Present Illness: [name] is a 89 y.o. female with a history of rectal cancer previously treated remotely with radiation.  Patient was admitted with acute onset diarrhea and anorexia without oral intake in over 48 hours per the patient's daughter.  She has had some confusion and is thought to have some baseline dementia although this has not been a formal diagnosis.  She is a poor historian but is responsive and appears to know her name and date of birth.  She lives in an assisted care facility in [place], [place] . CT scan of the abdomen and pelvis seem to indicate evidence of surgical changes with right colon and rectosigmoid anastomoses.  Also noted was a possible apple core lesion in the hepatic flexure region.  Also noted was perirectal fat stranding and thickening consistent with proctitis. Patient currently says that her diarrhea has resolved.  She appears alert and oriented x 3.  She says that while she is very thin, she has not had any acute weight loss or lasting weight loss over the past year.  Patient denies hematochezia, melena, abdominal pain, nausea or vomiting.  She reports the only reason she is in the hospital is secondary to her children asking her to come into the hospital.    Past Medical History:  Past Medical History:  Diagnosis Date   Allergic rhinitis, cause unspecified    Diarrhea    Edema    History of colon cancer    s/p surgery, chemotherapy   Internal hemorrhoids without mention of complication    Malignant neoplasm of rectum (HCC)    Osteoarthrosis, unspecified whether generalized or localized, unspecified site    Osteoporosis, unspecified    Other acute reactions to stress    Other and unspecified hyperlipidemia    Other screening mammogram     Pain in limb    Special screening for osteoporosis    Ulceration of vulva, unspecified    Unspecified essential hypertension     Problem List: Patient Active Problem List   Diagnosis Date Noted   Proctitis 06/25/2024   UTI (urinary tract infection) 06/25/2024   Leukocytosis 06/25/2024   Peripheral edema 06/08/2024   Balance problem 06/08/2024   Urinary incontinence 06/08/2024   Acute cystitis 05/29/2024   Acute renal failure superimposed on stage 2 chronic kidney disease (HCC) 05/29/2024   Altered mental state 05/29/2024   Acute kidney injury superimposed on chronic kidney disease (HCC) 05/29/2024   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 05/2021 07/01/2021   Lesion of right frontal lobe of brain 07/01/2021   History of stroke involving cerebellum    Right-sided low back pain without sciatica 01/25/2019   Vitamin D  deficiency 12/12/2012   Chronic diarrhea 10/27/2009   History of rectal cancer 07/23/2009   ALLERGIC RHINITIS 01/16/2009   HEMORRHOIDS, INTERNAL 11/20/2008   ANXIETY, SITUATIONAL 05/31/2007   HLD (hyperlipidemia) 05/30/2007   Essential hypertension, benign 05/30/2007   Osteoarthritis of knees, bilateral 05/30/2007   At high risk for osteoporosis, no DEXA 05/30/2007    Past Surgical History: Past Surgical History:  Procedure Laterality Date   dental implants     PARTIAL COLECTOMY  9/10   temporary colostomy    Allergies: No Known Allergies  Home Medications: Medications Prior to Admission  Medication  Sig Dispense Refill Last Dose/Taking   aspirin  EC 81 MG EC tablet Take 1 tablet (81 mg total) by mouth daily. Swallow whole. 30 tablet 0 06/25/2024 at 11:00 AM   diltiazem  (CARDIZEM  CD) 240 MG 24 hr capsule Take 1 capsule (240 mg total) by mouth daily. 90 capsule 3 06/25/2024 Noon   loperamide  (IMODIUM  A-D) 2 MG tablet Take 1 tablet (2 mg total) by mouth 4 (four) times daily as needed for diarrhea or loose stools. 30 tablet 0 Unknown   Multiple  Vitamin (MULTIVITAMIN ADULT PO) Take by mouth.   06/25/2024 Morning   Opium  10 MG/ML (1%) TINC Please take 0.2-0.4 mL three times per day   06/25/2024 [facility] medication reconciliation was completed with the patient.   Scheduled Inpatient Medications:    aspirin  EC  81 mg Oral Daily   diltiazem   240 mg Oral Daily   heparin   5,000 Units Subcutaneous Q8H   Opium   0.2 mL Oral TID AC    Continuous Inpatient Infusions:    [START ON 06/27/2024] cefTRIAXone  (ROCEPHIN )  IV      PRN Inpatient Medications:  acetaminophen  **OR** acetaminophen , hydrALAZINE , loperamide , midazolam , ondansetron  **OR** ondansetron  (ZOFRAN ) IV  Family History: family history includes Arthritis in her brother and mother; Depression in her sister; Stroke in her mother.   GI Family History: Negative  Social History:   reports that she has never smoked. She has never used smokeless tobacco. She reports current drug use. Drug: Opium . She reports that she does not drink alcohol. The patient denies ETOH, tobacco, or drug use.    Review of Systems: Review of Systems - Negative except HPI  Physical Examination: BP 136/72 (BP Location: Left Arm)   Pulse 73   Temp 98.2 F (36.8 C) (Oral)   Resp 12   Ht 5' 4 (1.626 m)   Wt 44 kg   SpO2 98%   BMI 16.65 kg/m  Physical Exam Vitals reviewed.  Constitutional:      General: She is not in acute distress.    Appearance: She is not toxic-appearing or diaphoretic.  HENT:     Head: Normocephalic.     Right Ear: Tympanic membrane normal.     Nose: Nose normal.     Mouth/Throat:     Mouth: Mucous membranes are dry.     Pharynx: Oropharynx is clear.  Eyes:     General: No scleral icterus.    Extraocular Movements: Extraocular movements intact.     Conjunctiva/sclera: Conjunctivae normal.     Pupils: Pupils are equal, round, and reactive to light.  Cardiovascular:     Rate and Rhythm: Normal rate.  Abdominal:     General: There is no distension.     Palpations:  Abdomen is soft. There is no mass.     Tenderness: There is no abdominal tenderness. There is no guarding.  Musculoskeletal:        General: No swelling or tenderness.     Cervical back: Normal range of motion and neck supple.  Neurological:     General: No focal deficit present.     Mental Status: She is alert.  Psychiatric:        Mood and Affect: Mood normal.        Thought Content: Thought content normal.        Judgment: Judgment normal.     Data: Lab Results  Component Value Date   WBC 9.2 06/26/2024   HGB 11.2 (L) 06/26/2024  HCT 34.2 (L) 06/26/2024   MCV 94.0 06/26/2024   PLT 506 (H) 06/26/2024   Recent Labs  Lab 06/25/24 1230 06/26/24 0515  HGB 14.2 11.2*   Lab Results  Component Value Date   NA 139 06/26/2024   K 3.3 (L) 06/26/2024   CL 105 06/26/2024   CO2 26 06/26/2024   BUN 18 06/26/2024   CREATININE 0.62 06/26/2024   Lab Results  Component Value Date   ALT 14 06/25/2024   AST 20 06/25/2024   ALKPHOS 70 06/25/2024   BILITOT 1.0 06/25/2024   No results for input(s): APTT, INR, PTT in the last 168 hours.    Latest Ref Rng & Units 06/26/2024    5:15 AM 06/25/2024   12:30 PM 06/08/2024    3:52 PM  CBC  WBC 4.0 - 10.5 K/uL 9.2  14.2  10.6   Hemoglobin 12.0 - 15.0 g/dL 88.7  85.7  87.2   Hematocrit 36.0 - 46.0 % 34.2  42.9  39.0   Platelets 150 - 400 K/uL 506  311  629     STUDIES: CT ABDOMEN PELVIS W CONTRAST Result Date: 06/25/2024 CLINICAL DATA:  Abdominal pain EXAM: CT ABDOMEN AND PELVIS WITH CONTRAST TECHNIQUE: Multidetector CT imaging of the abdomen and pelvis was performed using the standard protocol following bolus administration of intravenous contrast. RADIATION DOSE REDUCTION: This exam was performed according to the departmental dose-optimization program which includes automated exposure control, adjustment of the mA and/or kV according to patient size and/or use of iterative reconstruction technique. CONTRAST:  OMNIPAQUE  IOHEXOL   300 MG/ML  SOLN COMPARISON:  10/27/2020. FINDINGS: Lower chest: Linear bibasilar scarring or subsegmental atelectasis. No pleural or pericardial effusions. Hepatobiliary: Subcentimeter hypodensity right lobe is consistent with a cyst. There is a central biliary ductal dilatation status post cholecystectomy. Pancreas: Unremarkable. No pancreatic ductal dilatation or surrounding inflammatory changes. Spleen: Normal in size without focal abnormality. Adrenals/Urinary Tract: Stable findings noted as follows: Numerous bilateral renal cysts. Dystrophic renal parenchymal calcification on the right. Left-sided extrarenal pelvis and parapelvic renal cysts. No hydronephrosis. Unremarkable urinary bladder. Stomach/Bowel: Anastomosis in the right colon and rectosigmoid. No bowel dilatation to suggest obstruction. Rectal wall thickening and perirectal and presacral fat stranding could indicate proctitis. In the hepatic flexure region there is mucosal thickening with an appearance suggesting possible apple-core type colonic lesion which could be better evaluated endoscopically if indicated. Vascular/Lymphatic: Aortic atherosclerosis. No enlarged abdominal or pelvic lymph nodes. Reproductive: Cystic structure in the cul-de-sac on the right was present in 2022. There is a larger cystic structure in the right adnexa measuring 3 point which could be an ovarian cyst. This might be better assessed with pelvic ultrasound. Other: No abdominal wall hernia or abnormality. No abdominopelvic ascites. Musculoskeletal: Osteopenia. L5-S1 degenerative disc disease and grade 1 L5 anterolisthesis. IMPRESSION: 1. Rectal wall thickening and perirectal fat stranding consistent with proctitis. 2. Possible right-sided colonic lesion in the hepatic flexure. This could be better evaluated endoscopically. 3. Cystic structures in the right adnexa and cul-de-sac. Consider pelvic ultrasound. 4. Aortic atherosclerosis (ICD10-I70.0). Electronically Signed    By: Fonda Field M.D.   On: 06/25/2024 16:48   @IMAGES @  Assessment:  Diarrhea - Apparently less than 24 hours per patient. Appears to be doing well at this time and appears appropriate. GI pathogen panel and C Diff negative. Abnormal CT scan of the GI tract - Surgical changes with possible apple core lesion at the area of the hepatic flexure. Proctitis on CT  Delirium -  Appears be clearing well at this time. On Rocephin  for possible UTI contributing to AMS. Right pelvic mass - per Medicine. Hx rectal cancer, previously in remission s/p low anterior resection.    Recommendations:  Colonoscopy. The patient understands the nature of the planned procedure, indications, risks, alternatives and potential complications including but not limited to bleeding, infection, perforation, damage to internal organs and possible oversedation/side effects from anesthesia. The patient DECLINES procedures at this time, citing I have been through so much, and I just want to go home and rest.  Continue diet as tolerated. Patient reports good appetite. Patient reports she may do procedure as outpatient but would not commit to scheduling at this time.  Will follow peripherally. Please keep us  informed of any change in decision by patient and/or family.   Thank you for the consult. Please call with questions or concerns.  Aundria Ladell Eck MD [facility] [address] [place], [place] [address] [phone]  06/26/2024 3:21 PM

## 2024-06-26 NOTE — Progress Notes (Signed)
 PROGRESS NOTE    [name]  FMW:[id number] DOB: 1933/10/11 DOA: 06/25/2024 PCP: Laurie Greig BRAVO, MD     Brief Narrative:   From admission h and p  Mr. [name] is an 89 year old female with history of rectal cancer status postsurgery and chemotherapy, prior UTI, hyperlipidemia, hypertension, who presents emergency department for chief concerns of diarrhea, poor p.o. intake.   Vitals in the ED showed T of 98.3, rr 15, hr 85, blood pressure 165/90, SpO2 99% on room air.   Serum sodium is 139, potassium 4.0, chloride 100, bicarb 27, BUN of 22, serum creatinine 0.77, EGFR greater than 60, nonfasting glucose 98, WBC 14.2, hemoglobin 14.2, platelets of 311.   ED treatment: Imodium  4 mg p.o. one-time dose, ceftriaxone  2 g IV one-time dose, sodium chloride  1 L bolus x 2. --------------------------------------- At bedside, patient able to tell me her first and last name, and her current location.  She was not able to tell me her age, current calendar year, current month.  She was able to tell me her birthday.   She reports she is in the hospital because her stomach started hurting today.  She was not able to tell me anything else.   Daughter states patient has not been eating and drinking water for about 2 days.   Social history: Patient lives in assisted living, [facility] in [place].    ROS: Unable to complete due to mentation, suspect baseline dementia   Assessment & Plan:   Principal Problem:   Proctitis Active Problems:   History of rectal cancer   HLD (hyperlipidemia)   Essential hypertension, benign   History of stroke involving cerebellum   Altered mental state   UTI (urinary tract infection)   Leukocytosis  # Abdominal pain # Diarrhea, acute on chronic History rectal cancer in remission, treated with radiation. Established with GI recently, plan for flex sig. CT scan shows signs proctitis, also possible right sided colonic lesion. Gi pathogen panel and c diff  negative - GI consult - will continue ceftriaxone  as treating possible UTI - home motility regimen (opium  tincture, loperamide )  # Colonic mass, possible - as above  # Right pelvic mass - will check pelvic u/s  # Altered mental status # Delirium Daughter reports recent decline in mental status. Non-focal exam. Possible uti though denies symptoms. Ct head last month nothing acute, non-focal neuro exam. No blood culture ordered prior to start of abx. Does have prior CVA - continue ceftriaxone  for possible uti - check MRI  # Debility Resides in independent living ([facility]) - PT consult  # Hx CVA - MRI as above - cont home asa  # HTN Bp controlled - home dilt    DVT prophylaxis: heparin  Code Status: full Family Communication: daughter and hcpoa [name] updated telephonically 9/2  Level of care: Med-Surg Status is: Observation    Consultants:  GI  Procedures: None thus far  Antimicrobials:  ceftriaxone     Subjective: No complaints, denies pain  Objective: Vitals:   06/25/24 1841 06/25/24 2026 06/26/24 0356 06/26/24 0828  BP: (!) 168/76 (!) 148/68 133/64 136/72  Pulse: 76 80 66 73  Resp: 17 20 16 12   Temp: 98.6 F (37 C) 98 F (36.7 C) 97.9 F (36.6 C) 98.2 F (36.8 C)  TempSrc: Oral   Oral  SpO2: 100% 97% 97% 98%  Weight:      Height:        Intake/Output Summary (Last 24 hours) at 06/26/2024 1155 Last  data filed at 06/26/2024 1014 Gross per 24 hour  Intake 2470.59 ml  Output 50 ml  Net 2420.59 ml   Filed Weights   06/25/24 1230  Weight: 44 kg    Examination:  General exam: Appears calm and comfortable  Respiratory system: Clear to auscultation. Respiratory effort normal. Cardiovascular system: S1 & S2 heard, RRR.   Gastrointestinal system: Abdomen is nondistended, soft and nontender. Central nervous system: Alert and oriented. No focal neurological deficits. Extremities: Symmetric 5 x 5 power. Skin: No rashes, lesions or  ulcers Psychiatry: pleasantly confused    Data Reviewed: I have personally reviewed following labs and imaging studies  CBC: Recent Labs  Lab 06/25/24 1230 06/26/24 0515  WBC 14.2* 9.2  HGB 14.2 11.2*  HCT 42.9 34.2*  MCV 93.1 94.0  PLT 311 506*   Basic Metabolic Panel: Recent Labs  Lab 06/25/24 1311 06/26/24 0515  NA 139 139  K 4.0 3.3*  CL 100 105  CO2 27 26  GLUCOSE 98 98  BUN 22 18  CREATININE 0.77 0.62  CALCIUM 9.7 8.6*   GFR: Estimated Creatinine Clearance: 33.1 mL/min (by C-G formula based on SCr of 0.62 mg/dL). Liver Function Tests: Recent Labs  Lab 06/25/24 1311  AST 20  ALT 14  ALKPHOS 70  BILITOT 1.0  PROT 5.9*  ALBUMIN 3.2*   No results for input(s): LIPASE, AMYLASE in the last 168 hours. No results for input(s): AMMONIA in the last 168 hours. Coagulation Profile: No results for input(s): INR, PROTIME in the last 168 hours. Cardiac Enzymes: No results for input(s): CKTOTAL, CKMB, CKMBINDEX, TROPONINI in the last 168 hours. BNP (last 3 results) No results for input(s): PROBNP in the last 8760 hours. HbA1C: No results for input(s): HGBA1C in the last 72 hours. CBG: No results for input(s): GLUCAP in the last 168 hours. Lipid Profile: No results for input(s): CHOL, HDL, LDLCALC, TRIG, CHOLHDL, LDLDIRECT in the last 72 hours. Thyroid  Function Tests: No results for input(s): TSH, T4TOTAL, FREET4, T3FREE, THYROIDAB in the last 72 hours. Anemia Panel: No results for input(s): VITAMINB12, FOLATE, FERRITIN, TIBC, IRON, RETICCTPCT in the last 72 hours. Urine analysis:    Component Value Date/Time   COLORURINE YELLOW (A) 06/25/2024 1545   APPEARANCEUR CLOUDY (A) 06/25/2024 1545   APPEARANCEUR Hazy 03/31/2013 2230   LABSPEC 1.010 06/25/2024 1545   LABSPEC 1.019 03/31/2013 2230   PHURINE 7.0 06/25/2024 1545   GLUCOSEU NEGATIVE 06/25/2024 1545   GLUCOSEU Negative 03/31/2013 2230   HGBUR  NEGATIVE 06/25/2024 1545   HGBUR negative 06/15/2010 1527   BILIRUBINUR NEGATIVE 06/25/2024 1545   BILIRUBINUR Negative 01/24/2019 1216   BILIRUBINUR Negative 03/31/2013 2230   KETONESUR 20 (A) 06/25/2024 1545   PROTEINUR NEGATIVE 06/25/2024 1545   UROBILINOGEN 0.2 01/24/2019 1216   UROBILINOGEN 0.2 06/15/2010 1527   NITRITE NEGATIVE 06/25/2024 1545   LEUKOCYTESUR LARGE (A) 06/25/2024 1545   LEUKOCYTESUR 2+ 03/31/2013 2230   Sepsis Labs: @LABRCNTIP (procalcitonin:4,lacticidven:4)  ) Recent Results (from the past 240 hours)  Resp panel by RT-PCR (RSV, Flu A&B, Covid) Anterior Nasal Swab     Status: None   Collection Time: 06/25/24 12:57 PM   Specimen: Anterior Nasal Swab  Result Value Ref Range Status   SARS Coronavirus 2 by RT PCR NEGATIVE NEGATIVE Final    Comment: (NOTE) SARS-CoV-2 target nucleic acids are NOT DETECTED.  The SARS-CoV-2 RNA is generally detectable in upper respiratory specimens during the acute phase of infection. The lowest concentration of SARS-CoV-2 viral copies this assay  can detect is 138 copies/mL. A negative result does not preclude SARS-Cov-2 infection and should not be used as the sole basis for treatment or other patient management decisions. A negative result may occur with  improper specimen collection/handling, submission of specimen other than nasopharyngeal swab, presence of viral mutation(s) within the areas targeted by this assay, and inadequate number of viral copies(<138 copies/mL). A negative result must be combined with clinical observations, patient history, and epidemiological information. The expected result is Negative.  Fact Sheet for Patients:  BloggerCourse.com  Fact Sheet for Healthcare Providers:  SeriousBroker.it  This test is no t yet approved or cleared by the [place]  FDA and  has been authorized for detection and/or diagnosis of SARS-CoV-2 by FDA under an Emergency  Use Authorization (EUA). This EUA will remain  in effect (meaning this test can be used) for the duration of the COVID-19 declaration under Section 564(b)(1) of the Act, 21 U.S.C.section 360bbb-3(b)(1), unless the authorization is terminated  or revoked sooner.       Influenza A by PCR NEGATIVE NEGATIVE Final   Influenza B by PCR NEGATIVE NEGATIVE Final    Comment: (NOTE) The Xpert Xpress SARS-CoV-2/FLU/RSV plus assay is intended as an aid in the diagnosis of influenza from Nasopharyngeal swab specimens and should not be used as a sole basis for treatment. Nasal washings and aspirates are unacceptable for Xpert Xpress SARS-CoV-2/FLU/RSV testing.  Fact Sheet for Patients: BloggerCourse.com  Fact Sheet for Healthcare Providers: SeriousBroker.it  This test is not yet approved or cleared by the [place]  FDA and has been authorized for detection and/or diagnosis of SARS-CoV-2 by FDA under an Emergency Use Authorization (EUA). This EUA will remain in effect (meaning this test can be used) for the duration of the COVID-19 declaration under Section 564(b)(1) of the Act, 21 U.S.C. section 360bbb-3(b)(1), unless the authorization is terminated or revoked.     Resp Syncytial Virus by PCR NEGATIVE NEGATIVE Final    Comment: (NOTE) Fact Sheet for Patients: BloggerCourse.com  Fact Sheet for Healthcare Providers: SeriousBroker.it  This test is not yet approved or cleared by the [place]  FDA and has been authorized for detection and/or diagnosis of SARS-CoV-2 by FDA under an Emergency Use Authorization (EUA). This EUA will remain in effect (meaning this test can be used) for the duration of the COVID-19 declaration under Section 564(b)(1) of the Act, 21 U.S.C. section 360bbb-3(b)(1), unless the authorization is terminated or revoked.  Performed at [facility], [address] [place], [place] [address]   C Difficile Quick Screen w PCR reflex     Status: None   Collection Time: 06/25/24  1:30 PM   Specimen: STOOL  Result Value Ref Range Status   C Diff antigen NEGATIVE NEGATIVE Final   C Diff toxin NEGATIVE NEGATIVE Final   C Diff interpretation No C. difficile detected.  Final    Comment: Performed at [facility], [address] [place], [place] [address]  Gastrointestinal Panel by PCR , Stool     Status: None   Collection Time: 06/25/24  1:30 PM   Specimen: STOOL  Result Value Ref Range Status   Campylobacter species NOT DETECTED NOT DETECTED Final   Plesimonas shigelloides NOT DETECTED NOT DETECTED Final   Salmonella species NOT DETECTED NOT DETECTED Final   Yersinia enterocolitica NOT DETECTED NOT DETECTED Final   Vibrio species NOT DETECTED NOT DETECTED Final   Vibrio cholerae NOT DETECTED NOT DETECTED Final   Enteroaggregative E coli (EAEC) NOT  DETECTED NOT DETECTED Final   Enteropathogenic E coli (EPEC) NOT DETECTED NOT DETECTED Final   Enterotoxigenic E coli (ETEC) NOT DETECTED NOT DETECTED Final   Shiga like toxin producing E coli (STEC) NOT DETECTED NOT DETECTED Final   Shigella/Enteroinvasive E coli (EIEC) NOT DETECTED NOT DETECTED Final   Cryptosporidium NOT DETECTED NOT DETECTED Final   Cyclospora cayetanensis NOT DETECTED NOT DETECTED Final   Entamoeba histolytica NOT DETECTED NOT DETECTED Final   Giardia lamblia NOT DETECTED NOT DETECTED Final   Adenovirus F40/41 NOT DETECTED NOT DETECTED Final   Astrovirus NOT DETECTED NOT DETECTED Final   Norovirus GI/GII NOT DETECTED NOT DETECTED Final   Rotavirus A NOT DETECTED NOT DETECTED Final   Sapovirus (I, II, IV, and V) NOT DETECTED NOT DETECTED Final    Comment: Performed at [facility], [address] [place], [place] [address]         Radiology Studies: CT ABDOMEN PELVIS W CONTRAST Result Date: 06/25/2024 CLINICAL DATA:  Abdominal pain EXAM: CT  ABDOMEN AND PELVIS WITH CONTRAST TECHNIQUE: Multidetector CT imaging of the abdomen and pelvis was performed using the standard protocol following bolus administration of intravenous contrast. RADIATION DOSE REDUCTION: This exam was performed according to the departmental dose-optimization program which includes automated exposure control, adjustment of the mA and/or kV according to patient size and/or use of iterative reconstruction technique. CONTRAST:  OMNIPAQUE  IOHEXOL  300 MG/ML  SOLN COMPARISON:  10/27/2020. FINDINGS: Lower chest: Linear bibasilar scarring or subsegmental atelectasis. No pleural or pericardial effusions. Hepatobiliary: Subcentimeter hypodensity right lobe is consistent with a cyst. There is a central biliary ductal dilatation status post cholecystectomy. Pancreas: Unremarkable. No pancreatic ductal dilatation or surrounding inflammatory changes. Spleen: Normal in size without focal abnormality. Adrenals/Urinary Tract: Stable findings noted as follows: Numerous bilateral renal cysts. Dystrophic renal parenchymal calcification on the right. Left-sided extrarenal pelvis and parapelvic renal cysts. No hydronephrosis. Unremarkable urinary bladder. Stomach/Bowel: Anastomosis in the right colon and rectosigmoid. No bowel dilatation to suggest obstruction. Rectal wall thickening and perirectal and presacral fat stranding could indicate proctitis. In the hepatic flexure region there is mucosal thickening with an appearance suggesting possible apple-core type colonic lesion which could be better evaluated endoscopically if indicated. Vascular/Lymphatic: Aortic atherosclerosis. No enlarged abdominal or pelvic lymph nodes. Reproductive: Cystic structure in the cul-de-sac on the right was present in 2022. There is a larger cystic structure in the right adnexa measuring 3 point which could be an ovarian cyst. This might be better assessed with pelvic ultrasound. Other: No abdominal wall hernia or  abnormality. No abdominopelvic ascites. Musculoskeletal: Osteopenia. L5-S1 degenerative disc disease and grade 1 L5 anterolisthesis. IMPRESSION: 1. Rectal wall thickening and perirectal fat stranding consistent with proctitis. 2. Possible right-sided colonic lesion in the hepatic flexure. This could be better evaluated endoscopically. 3. Cystic structures in the right adnexa and cul-de-sac. Consider pelvic ultrasound. 4. Aortic atherosclerosis (ICD10-I70.0). Electronically Signed   By: Fonda Field M.D.   On: 06/25/2024 16:48        Scheduled Meds:  aspirin  EC  81 mg Oral Daily   diltiazem   240 mg Oral Daily   heparin   5,000 Units Subcutaneous Q8H   Continuous Infusions:  cefTRIAXone  (ROCEPHIN )  IV 2 g (06/26/24 0955)   lactated ringers  125 mL/hr at 06/26/24 0340     LOS: 0 days     Devaughn KATHEE Ban, MD [facility]   If 7PM-7AM, please contact night-coverage [email or web address] Password [email or web address] 06/26/2024, 11:55 AM

## 2024-06-26 NOTE — Evaluation (Signed)
 Physical Therapy Evaluation Patient Details Name: [name] MRN: [id number] DOB: Oct 24, 1933 Today's Date: 06/26/2024  History of Present Illness  Pt is an 89 year old female with history of rectal cancer status postsurgery and chemotherapy, prior UTI, hyperlipidemia, CVA, hypertension, who presents emergency department for chief concerns of diarrhea, poor p.o. intake. MD assessment includes: abdominal pain, diarrhea, possible colonic mass, R pelvic mass, AMS, delirium, and debility.   Clinical Impression  Pt was pleasant and motivated to participate during the session and put forth good effort throughout. Pt demonstrated good speed and effort with bed mobility tasks and was steady with good eccentric and concentric control and stability with transfers without an AD.  Pt was able to amb 150 feet with some mild drifting noted mostly during small amplitude head turns but presented with no overt LOB including during start/stops and 180 deg turns.  Pt reported no adverse symptoms during the session with SpO2 and HR WNL throughout on room air.  Pt did report feeling weaker than at baseline and will benefit from continued PT services upon discharge to safely address deficits listed in patient problem list for decreased caregiver assistance and eventual return to PLOF.           If plan is discharge home, recommend the following: A little help with walking and/or transfers;A little help with bathing/dressing/bathroom;Direct supervision/assist for medications management;Assist for transportation   Can travel by private vehicle        Equipment Recommendations None recommended by PT  Recommendations for Other Services       Functional Status Assessment Patient has had a recent decline in their functional status and demonstrates the ability to make significant improvements in function in a reasonable and predictable amount of time.     Precautions / Restrictions Precautions Precautions:  Fall Restrictions Weight Bearing Restrictions Per Provider Order: No      Mobility  Bed Mobility Overal bed mobility: Independent             General bed mobility comments: Good speed and effort with bed mobility tasks    Transfers Overall transfer level: Needs assistance Equipment used: None Transfers: Sit to/from Stand Sit to Stand: Supervision           General transfer comment: Good eccentric and concentric control and stability    Ambulation/Gait Ambulation/Gait assistance: Supervision Gait Distance (Feet): 150 Feet Assistive device: None Gait Pattern/deviations: Decreased stride length, Drifts right/left Gait velocity: Mildly decreased     General Gait Details: Mild drifting left/right most notably when cued for head turns but generally steady with no overt LOB including during start/stops and 180 deg turns  Careers information officer     Tilt Bed    Modified Rankin (Stroke Patients Only)       Balance Overall balance assessment: Needs assistance   Sitting balance-Leahy Scale: Normal     Standing balance support: During functional activity, No upper extremity supported Standing balance-Leahy Scale: Good                               Pertinent Vitals/Pain Pain Assessment Pain Assessment: No/denies pain    Home Living Family/patient expects to be discharged to:: Assisted living Living Arrangements: Alone               Home Equipment: None Additional Comments: Recently moved into [place] in their independent living area  per pt report    Prior Function Prior Level of Function : Independent/Modified Independent             Mobility Comments: Ind amb without an AD community distances, no fall history ADLs Comments: Ind with ADLs     Extremity/Trunk Assessment   Upper Extremity Assessment Upper Extremity Assessment: Overall WFL for tasks assessed    Lower Extremity Assessment Lower  Extremity Assessment: Generalized weakness       Communication   Communication Communication: No apparent difficulties    Cognition Arousal: Alert Behavior During Therapy: WFL for tasks assessed/performed   PT - Cognitive impairments: No apparent impairments                       PT - Cognition Comments: Alert to self, location, and DOB Following commands: Intact       Cueing Cueing Techniques: Verbal cues     General Comments      Exercises     Assessment/Plan    PT Assessment Patient needs continued PT services  PT Problem List Decreased strength;Decreased activity tolerance;Decreased balance;Decreased mobility       PT Treatment Interventions DME instruction;Gait training;Functional mobility training;Therapeutic activities;Therapeutic exercise;Balance training;Patient/family education    PT Goals (Current goals can be found in the Care Plan section)  Acute Rehab PT Goals Patient Stated Goal: To return to [facility] PT Goal Formulation: With patient Time For Goal Achievement: 07/09/24 Potential to Achieve Goals: Good    Frequency Min 2X/week     Co-evaluation               AM-PAC PT 6 Clicks Mobility  Outcome Measure Help needed turning from your back to your side while in a flat bed without using bedrails?: None Help needed moving from lying on your back to sitting on the side of a flat bed without using bedrails?: None Help needed moving to and from a bed to a chair (including a wheelchair)?: A Little Help needed standing up from a chair using your arms (e.g., wheelchair or bedside chair)?: A Little Help needed to walk in hospital room?: A Little Help needed climbing 3-5 steps with a railing? : A Little 6 Click Score: 20    End of Session Equipment Utilized During Treatment: Gait belt Activity Tolerance: Patient tolerated treatment well Patient left: in bed;with call bell/phone within reach;with bed alarm set;Other (comment) (Pt  declined up to chair secondary to wanting to take a nap) Nurse Communication: Mobility status PT Visit Diagnosis: Unsteadiness on feet (R26.81);Difficulty in walking, not elsewhere classified (R26.2);Muscle weakness (generalized) (M62.81)    Time: 8540-8486 PT Time Calculation (min) (ACUTE ONLY): 14 min   Charges:   PT Evaluation $PT Eval Moderate Complexity: 1 Mod   PT General Charges $$ ACUTE PT VISIT: 1 Visit       D. Scott Joye Wesenberg PT, DPT 06/26/24, 3:28 PM

## 2024-06-27 DIAGNOSIS — K6289 Other specified diseases of anus and rectum: Secondary | ICD-10-CM | POA: Diagnosis not present

## 2024-06-27 LAB — BASIC METABOLIC PANEL WITH GFR
Anion gap: 9 (ref 5–15)
BUN: 15 mg/dL (ref 8–23)
CO2: 27 mmol/L (ref 22–32)
Calcium: 8.8 mg/dL — ABNORMAL LOW (ref 8.9–10.3)
Chloride: 103 mmol/L (ref 98–111)
Creatinine, Ser: 0.67 mg/dL (ref 0.44–1.00)
GFR, Estimated: >60 mL/min (ref >60)
Glucose, Bld: 93 mg/dL (ref 70–99)
Potassium: 3.1 mmol/L — ABNORMAL LOW (ref 3.5–5.1)
Sodium: 139 mmol/L (ref 135–145)

## 2024-06-27 LAB — GLUCOSE, CAPILLARY: Glucose-Capillary: 124 mg/dL — ABNORMAL HIGH (ref 70–99)

## 2024-06-27 LAB — CBC
HCT: 31.6 % — ABNORMAL LOW (ref 36.0–46.0)
Hemoglobin: 10.3 g/dL — ABNORMAL LOW (ref 12.0–15.0)
MCH: 30.6 pg (ref 26.0–34.0)
MCHC: 32.6 g/dL (ref 30.0–36.0)
MCV: 93.8 fL (ref 80.0–100.0)
Platelets: 489 K/uL — ABNORMAL HIGH (ref 150–400)
RBC: 3.37 MIL/uL — ABNORMAL LOW (ref 3.87–5.11)
RDW: 15.5 % (ref 11.5–15.5)
WBC: 7.2 K/uL (ref 4.0–10.5)
nRBC: 0 % (ref 0.0–0.2)

## 2024-06-27 MED ORDER — ADULT MULTIVITAMIN W/MINERALS CH
1.0000 | ORAL_TABLET | Freq: Every day | ORAL | Status: DC
  Administered 2024-06-28: 1 via ORAL
  Filled 2024-06-27: qty 1

## 2024-06-27 MED ORDER — POTASSIUM CHLORIDE CRYS ER 20 MEQ PO TBCR
40.0000 meq | EXTENDED_RELEASE_TABLET | Freq: Once | ORAL | Status: AC
  Administered 2024-06-27: 40 meq via ORAL
  Filled 2024-06-27: qty 2

## 2024-06-27 MED ORDER — ENSURE PLUS HIGH PROTEIN PO LIQD
237.0000 mL | Freq: Two times a day (BID) | ORAL | Status: DC
  Administered 2024-06-27: 237 mL via ORAL

## 2024-06-27 MED ORDER — B COMPLEX-C PO TABS
1.0000 | ORAL_TABLET | Freq: Every day | ORAL | Status: DC
  Administered 2024-06-28: 1 via ORAL
  Filled 2024-06-27: qty 1

## 2024-06-27 NOTE — TOC Initial Note (Signed)
 Transition of Care [facility]) - Initial/Assessment Note    Patient Details  Name: [name] MRN: [id number] Date of Birth: 1933-09-06  Transition of Care [facility]) CM/SW Contact:    Marinda Cooks, RN Phone Number: 06/27/2024, 4:35 PM  Clinical Narrative:                  This CM spoke with pt's daughter [name] Galley(HCPOA) introduced role  and completed Initial assessement. Pt A&O x 3 with some confusion per bedside RN. Pt  daughter reports pt recent moved to [place] 3 wks ago into the [place] area of community and lives alone. Pt has family support provided by Daughters [name] ans [name]. Mr Nyle informed pt had Personal Care services coordinated with agency [facility]. This CM informed agency provided services 3 x a wk for 3 hr 1st day & then 2 hrs the other 2 days, services included assisting pt with ADL'S , cooking , medication reminders not administration , laundry etc. Pt uses  [facility]  in [place] in  [place]. PCP is  listed on demographic as Amy Bedsole . Pt  has access to DME  that includes rollator & shower chair.Pt  does not have any  community resources coordinated currently. This  CM updated that [facility] provided 1 cooked meal daily for pt. TOC  will cont to follow pt dc planning / care coordination during hospital stay and update as applicable.       Prior Living Arrangements/Services    [place] [facility] side of community                    Activities of Daily Living   ADL Screening (condition at time of admission) Independently performs ADLs?: Yes (appropriate for developmental age) Is the patient deaf or have difficulty hearing?: No Does the patient have difficulty seeing, even when wearing glasses/contacts?: No Does the patient have difficulty concentrating, remembering, or making decisions?: No          Admission diagnosis:  Proctitis [K62.89] Diarrhea, unspecified type [R19.7] Patient Active Problem List   Diagnosis Date Noted    Proctitis 06/25/2024   UTI (urinary tract infection) 06/25/2024   Leukocytosis 06/25/2024   Peripheral edema 06/08/2024   Balance problem 06/08/2024   Urinary incontinence 06/08/2024   Acute cystitis 05/29/2024   Acute renal failure superimposed on stage 2 chronic kidney disease (HCC) 05/29/2024   Altered mental state 05/29/2024   Acute kidney injury superimposed on chronic kidney disease (HCC) 05/29/2024   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 05/2021 07/01/2021   Lesion of right frontal lobe of brain 07/01/2021   History of stroke involving cerebellum    Right-sided low back pain without sciatica 01/25/2019   Vitamin D  deficiency 12/12/2012   Chronic diarrhea 10/27/2009   History of rectal cancer 07/23/2009   ALLERGIC RHINITIS 01/16/2009   HEMORRHOIDS, INTERNAL 11/20/2008   ANXIETY, SITUATIONAL 05/31/2007   HLD (hyperlipidemia) 05/30/2007   Essential hypertension, benign 05/30/2007   Osteoarthritis of knees, bilateral 05/30/2007   At high risk for osteoporosis, no DEXA 05/30/2007   PCP:  [name] Greig BRAVO, MD Pharmacy:   [facility] [address], [place] - [address] [place] [address] Phone: [phone] Fax: [phone]     Social Drivers of Health (SDOH) Social History: SDOH Screenings   Food Insecurity: Patient Declined (06/25/2024)  Housing: Unknown (06/25/2024)  Transportation Needs: Patient Declined (06/25/2024)  Utilities: Patient Declined (06/25/2024)  Alcohol Screen: Low Risk  (07/08/2020)  Depression (PHQ2-9): Medium Risk (06/08/2024)  Financial Resource Strain: Low Risk  (03/17/2022)  Physical Activity: Inactive (03/17/2022)  Social Connections: Patient Declined (06/25/2024)  Stress: No Stress Concern Present (03/17/2022)  Tobacco Use: Low Risk  (06/25/2024)   SDOH Interventions:     Readmission Risk Interventions     No data to display

## 2024-06-27 NOTE — Progress Notes (Addendum)
 Progress Note   Patient: [name] FMW:[id number] DOB: 07-14-1933 DOA: 06/25/2024     1 DOS: the patient was seen and examined on 06/27/2024   Brief hospital course: HPI on admission 06/25/24: [name] is an 89 year old female with history of rectal cancer status postsurgery and chemotherapy, prior UTI, hyperlipidemia, hypertension, who presents emergency department for chief concerns of diarrhea, poor p.o. intake.... See H&P for full HPI on admission & ED course.  Patient was admitted for further evaluation and management as outlined in detail below.  In summary, CT abdomen/pelvis showed findings of proctitis.  Diarrhea has resolved.  GI panel and C diff were negative.  Patient was started on empiric IV antibiotics for possible UTI.  Urine culture growing E coli and patient is noted to be clinically improving on antibiotics.  Due to possible right-sided colonic lesion, GI was consulted for colonoscopy.  After discussing with patient, this is deferred for now at patient's wishes, to be considered in outpatient setting if pt desires work up.  There was also right adnexal lesion noted on the CT scan with transvaginal U/S recommended to further evaluate.  Patient declines this evaluation as well, stating, if it were found to be cancerous, she would not wish to pursue treatment.   Assessment and Plan:  Abdominal pain - improved Diarrhea, acute on chronic - now resolved Stool studies negative. History rectal cancer in remission, treated with radiation. Established with GI recently, plan for flex sig.  CT scan shows signs proctitis, also possible right sided colonic lesion. --GI consulted - see notes.  Colonoscopy deferred to outpatient, pt declines invasive evaluation at this time --Imodium , home regimen PRN for diarrhea --D/C enteric precautions --?SIBO - consider course of Xifaxan  --?Bile salt diarrhea - given portion of terminal ileum resected - consider trial of cholestyramine  --  Will discuss with patient.  UTI - continue empiric Rocephin  Follow urine culture susceptibilities - growing E coli.  Hypokalemia - K 3.1 this AM --Oral Kcl to replace ordered --Monitor BMP, check Mg level   Colonic mass/lesion right-sided - noted on CT abd/pelvis.  GI was consulted and planned for colonoscopy, but pt declines at this time. --Outpatient GI follow up for colonoscopy if pt desires   Right pelvic mass, suspected ovarian cysts - noted on CT abd/pelvis.  Transvaginal U/S was ordered as recommended in CT report.  Discussed with patient today (9/3) and patient declines U/S.  She states she would not want to pursue treatment if the lesion was found to be cancer.  CT report describes R adnexal cystic structure present since 2022 in the cul-de-sac, and a larger cystic structure ~3cm could be ovarian cyst. --Patient declines TVUS to evaluate --Recommend to address in follow up if pt desires   Acute metabolic encephalopathy - AMS likely due to UTI, poor PO intake Daughter reports recent decline in mental status. Non-focal neurologic exam.  Ct head last month nothing acute, non-focal neuro exam.  MRI brain non-acute this admission --Mgmt of UTI as above --Delirium precautions --Encourage PO intake --Correct any metabolic derangements  Underweight -- Appreciate dietitian recommendations Body mass index is 16.65 kg/m. 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  She reports feeling better. Denies abdominal pain or N/V. Reports diarrhea has resolved.  No other acute complaints.  We discussed the ordered TVUS based on CT scan findings showing ?ovarian cyst / mass of some type.  Pt declines to have U/S, says she has been through enough and would not want to pursue cancer treatment if that were the diagnosis.     Physical Exam: Vitals:   06/26/24 1940 06/27/24 0221 06/27/24 0810 06/27/24 1347  BP: 132/64 127/72 (!) 148/67 130/61  Pulse: 76 72 70 74  Resp: 18 18 16 14   Temp: 98.2 F (36.8 C) 98.4 F (36.9 C) 97.9 F (36.6 C) 98.8 F (37.1 C)  TempSrc:   Oral   SpO2: 97% 99% 98% 98%  Weight:      Height:       General exam: awake, alert, no acute distress HEENT: moist mucus membranes, hearing grossly normal  Respiratory system: CTA, no wheezes, rales or rhonchi, normal respiratory effort. Cardiovascular system: normal S1/S2, RRR, no JVD, murmurs, rubs, gallops, no pedal edema.   Gastrointestinal system: soft, NT, ND, no HSM felt, +bowel sounds. Central nervous system: A&O x . no gross focal neurologic deficits, normal speech Extremities: moves all, no edema, normal tone Skin: dry, intact, normal temperature Psychiatry: normal mood, congruent affect, judgement and insight appear normal   Data Reviewed:  Notable labs --  K 3.1 Ca 8.8 Hbg 10.3 Platelets 489 No leukocytosis   GI panel - negative C diff - negative   MRI brain - no acute findings, chronic smell vessel disease changes, unchanged ?fibrous dysplasia of right frontal calvarium, old right cerebellar infarct, mild volume loss.    Micro - Urine culture + E coli - pending susceptibilities  Family Communication: attempted to call patient's daughter [name] this afternoon, it went to unidentified voicemail, so I did not leave a message.  Will attempt again later as time allows.  Attempted to call daughter, [name], this  evening, also got voicemail. Will attempt tomorrow AM.   Disposition: Status is: Inpatient Remains inpatient appropriate because: awaiting urine cultures, remains on IV antibiotics, ongoing evaluation.   Potential discharge tomorrow 9/4.   Planned Discharge Destination: Home with Home Health    Time spent: 45 minutes  Author: Burnard DELENA Cunning, DO 06/27/2024 2:09 PM  For on call review [email or web address].

## 2024-06-27 NOTE — Progress Notes (Signed)
 Physical Therapy Treatment Patient Details Name: [name] MRN: [id number] DOB: 03/08/1933 Today's Date: 06/27/2024   History of Present Illness Pt is an 89 year old female with history of rectal cancer status postsurgery and chemotherapy, prior UTI, hyperlipidemia, CVA, hypertension, who presents emergency department for chief concerns of diarrhea, poor p.o. intake. MD assessment includes: abdominal pain, diarrhea, possible colonic mass, R pelvic mass, AMS, delirium, and debility.    PT Comments  Patient received in bed, family at bedside. She is agreeable to PT session. Patient is independent with bed mobility. Supervision for transfers. Slightly unsteady initially, holding to counter, bed for support. She then pushed IV pole for remainder of session. Ambulated 300 feet, toileted and managed all self care with supervision. Patient will continue to benefit from skilled PT to improve independence and safety.    If plan is discharge home, recommend the following: A little help with walking and/or transfers;Assist for transportation;Help with stairs or ramp for entrance;A little help with bathing/dressing/bathroom   Can travel by private vehicle      yes  Equipment Recommendations  None recommended by PT    Recommendations for Other Services       Precautions / Restrictions Precautions Precautions: Fall Restrictions Weight Bearing Restrictions Per Provider Order: No     Mobility  Bed Mobility Overal bed mobility: Independent                  Transfers Overall transfer level: Needs assistance Equipment used: None Transfers: Sit to/from Stand Sit to Stand: Supervision                Ambulation/Gait Ambulation/Gait assistance: Supervision Gait Distance (Feet): 300 Feet Assistive device: IV Pole Gait Pattern/deviations: Step-through pattern Gait velocity: WNL     General Gait Details: Holding lightly to IV pole during ambulation for stability.   Stairs              Wheelchair Mobility     Tilt Bed    Modified Rankin (Stroke Patients Only)       Balance Overall balance assessment: Modified Independent Sitting-balance support: Feet supported Sitting balance-Leahy Scale: Normal     Standing balance support: Single extremity supported, During functional activity Standing balance-Leahy Scale: Good                              Communication Communication Communication: No apparent difficulties  Cognition Arousal: Alert Behavior During Therapy: WFL for tasks assessed/performed   PT - Cognitive impairments: No apparent impairments                         Following commands: Intact      Cueing Cueing Techniques: Verbal cues  Exercises      General Comments        Pertinent Vitals/Pain Pain Assessment Pain Assessment: No/denies pain    Home Living                          Prior Function            PT Goals (current goals can now be found in the care plan section) Acute Rehab PT Goals Patient Stated Goal: To return to [facility] PT Goal Formulation: With patient Time For Goal Achievement: 07/09/24 Potential to Achieve Goals: Good Progress towards PT goals: Progressing toward goals    Frequency    Min 2X/week  PT Plan      Co-evaluation              AM-PAC PT 6 Clicks Mobility   Outcome Measure  Help needed turning from your back to your side while in a flat bed without using bedrails?: None Help needed moving from lying on your back to sitting on the side of a flat bed without using bedrails?: None Help needed moving to and from a bed to a chair (including a wheelchair)?: A Little Help needed standing up from a chair using your arms (e.g., wheelchair or bedside chair)?: A Little Help needed to walk in hospital room?: A Little Help needed climbing 3-5 steps with a railing? : A Little 6 Click Score: 20    End of Session   Activity Tolerance:  Patient tolerated treatment well Patient left: in bed;with call bell/phone within reach;with family/visitor present Nurse Communication: Mobility status PT Visit Diagnosis: Unsteadiness on feet (R26.81)     Time: 8599-8571 PT Time Calculation (min) (ACUTE ONLY): 28 min  Charges:    $Therapeutic Activity: 8-22 mins PT General Charges $$ ACUTE PT VISIT: 1 Visit                     Jailyn Leeson, PT, GCS 06/27/24,2:34 PM

## 2024-06-27 NOTE — Care Management Important Message (Signed)
 Important Message  Patient Details  Name: [name] MRN: [id number] Date of Birth: [birth year]   Important Message Given:  Yes - Medicare IM     [name] 06/27/2024, 11:51 AM

## 2024-06-27 NOTE — Progress Notes (Addendum)
 Initial Nutrition Assessment  DOCUMENTATION CODES:   Underweight  INTERVENTION:   Ensure Plus High Protein po BID, each supplement provides 350 kcal and 20 grams of protein  MVI po daily   B-complex with C po daily   Liberalize diet   Pt at high refeed risk; recommend monitor potassium, magnesium and phosphorus labs daily until stable  Daily weights   Check vitamins B12, folate, D, A, zinc  and selenium labs  NUTRITION DIAGNOSIS:   Inadequate oral intake related to acute illness as evidenced by per patient/family report.  GOAL:   Patient will meet greater than or equal to 90% of their needs  MONITOR:   PO intake, Supplement acceptance, Labs, Weight trends, I & O's, Skin  REASON FOR ASSESSMENT:   Other (Comment) (Low BMI)    ASSESSMENT:   89 y/o female with h/o IBS-D, HLD, HTN, stroke, anxiety, CKD, remote chronic opiod use, rectal cancer s/p chemoradiation (with radiation proctitis) and laparoscopic proctectomy with primary anastomosis and proximal diverting loop ileostomy with colonic reservoir (05/2009) and s/p reversal of a diverting loop ileostomy (08/2009) complicated by anastamotic stricture and small bowel polyp s/p open laparotomy with Ileocecal resection (33cm terminal ileum) and resection of ileoileal anastomotic stricture (2012) and SIBO (with positive breath test 2012) who is admitted with abdominal pain, diarrhea, AMS and UTI.  RD working remotely.  Per chart review, pt with poor oral intake, diarrhea and abdominal pain for several days pta. Pt reports chronic diarrhea since her rectal surgeries in 2012. Pt has had resection of her terminal ileum; pt is at high risk for bile salt diarrhea and does take colestid  at home. Pt with h/o SIBO and was treated with metronidazole  which did not help the diarrhea. Pt is at high risk for nutrient deficiencies; RD will check vitamin labs to r/o deficiency. Per chart, pt is down 13lbs(12%) since March 2024; RD unsure how  recently weight loss occurred.   Pt ate 100% of her breakfast and lunch today. RD will add Ensure and vitamins to help pt meet her estimated needs. Pt is at high refeed risk. RD will also liberalize pt's diet. RD will follow up to obtain history and exam at follow up. Pt is at high risk for malnutrition.   Medications reviewed and include: aspirin , heparin , tincture of opium , ceftriaxone   Labs reviewed: K 3.1(L) Hgb 10.3(L), Hct 31.6(L) Cbgs- 124  NUTRITION - FOCUSED PHYSICAL EXAM: Unable to perform a this time   Diet Order:   Diet Order             Diet regular Room service appropriate? No; Fluid consistency: Thin  Diet effective now                  EDUCATION NEEDS:   Not appropriate for education at this time  Skin:  Skin Assessment: Reviewed RN Assessment  Last BM:  9/3- TYPE 7  Height:   Ht Readings from Last 1 Encounters:  06/25/24 5' 4 (1.626 m)    Weight:   Wt Readings from Last 1 Encounters:  06/25/24 44 kg    Ideal Body Weight:  54.5 kg  BMI:  Body mass index is 16.65 kg/m.  Estimated Nutritional Needs:   Kcal:  1300-1500kcal/day  Protein:  65-75g/day  Fluid:  1.2-1.4L/day  Augustin Shams MS, RD, LDN If unable to be reached, please send secure chat to RD inpatient available from 8:00a-4:00p daily

## 2024-06-28 DIAGNOSIS — K6289 Other specified diseases of anus and rectum: Secondary | ICD-10-CM | POA: Diagnosis not present

## 2024-06-28 LAB — BASIC METABOLIC PANEL WITH GFR
Anion gap: 8 (ref 5–15)
BUN: 21 mg/dL (ref 8–23)
CO2: 28 mmol/L (ref 22–32)
Calcium: 9.4 mg/dL (ref 8.9–10.3)
Chloride: 106 mmol/L (ref 98–111)
Creatinine, Ser: 0.68 mg/dL (ref 0.44–1.00)
GFR, Estimated: >60 mL/min (ref >60)
Glucose, Bld: 103 mg/dL — ABNORMAL HIGH (ref 70–99)
Potassium: 3.8 mmol/L (ref 3.5–5.1)
Sodium: 142 mmol/L (ref 135–145)

## 2024-06-28 LAB — FOLATE: Folate: >20 ng/mL (ref >5.9)

## 2024-06-28 LAB — CBC
HCT: 36.5 % (ref 36.0–46.0)
Hemoglobin: 12 g/dL (ref 12.0–15.0)
MCH: 31.3 pg (ref 26.0–34.0)
MCHC: 32.9 g/dL (ref 30.0–36.0)
MCV: 95.3 fL (ref 80.0–100.0)
Platelets: 573 K/uL — ABNORMAL HIGH (ref 150–400)
RBC: 3.83 MIL/uL — ABNORMAL LOW (ref 3.87–5.11)
RDW: 15.5 % (ref 11.5–15.5)
WBC: 8.6 K/uL (ref 4.0–10.5)
nRBC: 0 % (ref 0.0–0.2)

## 2024-06-28 LAB — VITAMIN B12: Vitamin B-12: <150 pg/mL — ABNORMAL LOW (ref 180–914)

## 2024-06-28 LAB — VITAMIN D 25 HYDROXY (VIT D DEFICIENCY, FRACTURES): Vit D, 25-Hydroxy: 28.79 ng/mL — ABNORMAL LOW (ref 30–100)

## 2024-06-28 LAB — MAGNESIUM: Magnesium: 1.9 mg/dL (ref 1.7–2.4)

## 2024-06-28 LAB — PHOSPHORUS: Phosphorus: 2.8 mg/dL (ref 2.5–4.6)

## 2024-06-28 MED ORDER — B COMPLEX-C PO TABS: 1.0000 | ORAL_TABLET | Freq: Every day | ORAL | 1 refills | Status: AC

## 2024-06-28 MED ORDER — CYANOCOBALAMIN 1000 MCG PO TABS: 1000.0000 ug | ORAL_TABLET | Freq: Every day | ORAL | 3 refills | Status: AC

## 2024-06-28 MED ORDER — VITAMIN B-12 1000 MCG PO TABS
1000.0000 ug | ORAL_TABLET | Freq: Every day | ORAL | Status: DC
  Administered 2024-06-28: 1000 ug via ORAL
  Filled 2024-06-28: qty 1

## 2024-06-28 MED ORDER — VITAMIN D (ERGOCALCIFEROL) 1.25 MG (50000 UNIT) PO CAPS
50000.0000 [IU] | ORAL_CAPSULE | ORAL | Status: DC
  Filled 2024-06-28: qty 1

## 2024-06-28 MED ORDER — ENSURE PLUS HIGH PROTEIN PO LIQD
237.0000 mL | Freq: Two times a day (BID) | ORAL | Status: AC
Start: 2024-06-28 — End: ?

## 2024-06-28 MED ORDER — MIRTAZAPINE 15 MG PO TABS: 7.5000 mg | ORAL_TABLET | Freq: Every day | ORAL | Status: DC

## 2024-06-28 MED ORDER — MIRTAZAPINE 7.5 MG PO TABS: 7.5000 mg | ORAL_TABLET | Freq: Every day | ORAL | 1 refills | Status: DC

## 2024-06-28 MED ORDER — VITAMIN D (ERGOCALCIFEROL) 1.25 MG (50000 UNIT) PO CAPS: 50000.0000 [IU] | ORAL_CAPSULE | ORAL | 2 refills | Status: AC

## 2024-06-28 NOTE — Progress Notes (Signed)
 Physical Therapy Treatment Patient Details Name: [name] MRN: [id number] DOB: [birth year] Today's Date: 06/28/2024   History of Present Illness Pt is an 89 year old female with history of rectal cancer status postsurgery and chemotherapy, prior UTI, hyperlipidemia, CVA, hypertension, who presents emergency department for chief concerns of diarrhea, poor p.o. intake. MD assessment includes: abdominal pain, diarrhea, possible colonic mass, R pelvic mass, AMS, delirium, and debility.    PT Comments  Pt was pleasant and motivated to participate during the session and put forth good effort throughout. Pt required no physical assistance during the session and was generally steady with standing activities with no overt LOB.  Pt was able to easily ambulate 300 feet without an AD with no adverse symptoms and was steady including during start/stops, head turns, and rapid 180 deg turns.  Pt participated in below unsupported standing balance training activities with some mild sway but no assist required to prevent LOB.  Max SLS standing time was 6 sec bilaterally.  Session ended somewhat early due to pt requiring BM with nursing in room at end of session to assist pt while on [facility].  Pt will benefit from continued PT services upon discharge to safely address deficits listed in patient problem list for decreased caregiver assistance and eventual return to PLOF.       If plan is discharge home, recommend the following: A little help with walking and/or transfers;Assist for transportation;Help with stairs or ramp for entrance;A little help with bathing/dressing/bathroom   Can travel by private vehicle        Equipment Recommendations  None recommended by PT    Recommendations for Other Services       Precautions / Restrictions Precautions Precautions: Fall Restrictions Weight Bearing Restrictions Per Provider Order: No     Mobility  Bed Mobility Overal bed mobility: Independent              General bed mobility comments: Good speed and effort with bed mobility tasks    Transfers Overall transfer level: Needs assistance Equipment used: None Transfers: Sit to/from Stand Sit to Stand: Supervision           General transfer comment: Good eccentric and concentric control and stability    Ambulation/Gait Ambulation/Gait assistance: Supervision Gait Distance (Feet): 300 Feet Assistive device: None Gait Pattern/deviations: Decreased stride length Gait velocity: Min decreased     General Gait Details: Mildly reduced cadence but generally steady with no overt LOB including during start/stops, head turns, and rapid 180 deg turns   Optometrist     Tilt Bed    Modified Rankin (Stroke Patients Only)       Balance Overall balance assessment: Needs assistance   Sitting balance-Leahy Scale: Normal     Standing balance support: No upper extremity supported Standing balance-Leahy Scale: Good                              Communication Communication Communication: No apparent difficulties  Cognition Arousal: Alert Behavior During Therapy: WFL for tasks assessed/performed   PT - Cognitive impairments: No apparent impairments                         Following commands: Intact      Cueing Cueing Techniques: Verbal cues  Exercises Other Exercises Other Exercises: Static unsupported standing balance training with feet apart,  together, and semi-tanded with combinations of EO/EC and head still/head turns Other Exercises: SLS balance training with max standing time 6 sec before needing to put elevated foot back down    General Comments        Pertinent Vitals/Pain Pain Assessment Pain Assessment: No/denies pain    Home Living                          Prior Function            PT Goals (current goals can now be found in the care plan section) Progress towards PT goals: Progressing  toward goals    Frequency    Min 2X/week      PT Plan      Co-evaluation              AM-PAC PT 6 Clicks Mobility   Outcome Measure  Help needed turning from your back to your side while in a flat bed without using bedrails?: None Help needed moving from lying on your back to sitting on the side of a flat bed without using bedrails?: None Help needed moving to and from a bed to a chair (including a wheelchair)?: A Little Help needed standing up from a chair using your arms (e.g., wheelchair or bedside chair)?: A Little Help needed to walk in hospital room?: A Little Help needed climbing 3-5 steps with a railing? : A Little 6 Click Score: 20    End of Session Equipment Utilized During Treatment: Gait belt Activity Tolerance: Patient tolerated treatment well Patient left: Other (comment) (Pt left on BSC for BM at end of session with nursing in room to assist) Nurse Communication: Mobility status PT Visit Diagnosis: Unsteadiness on feet (R26.81);Muscle weakness (generalized) (M62.81)     Time: 8584-8568 PT Time Calculation (min) (ACUTE ONLY): 16 min  Charges:    $Gait Training: 8-22 mins PT General Charges $$ ACUTE PT VISIT: 1 Visit                     D. Scott Addy Mcmannis PT, DPT 06/28/24, 2:47 PM

## 2024-06-28 NOTE — Discharge Instructions (Signed)
 Continue Outpatient PT at [facility]

## 2024-06-28 NOTE — Discharge Summary (Addendum)
 Physician Discharge Summary   Patient: [name] MRN: [id number] DOB: 11-03-1932  Admit date:     06/25/2024  Discharge date: 06/28/2024  Discharge Physician: Burnard DELENA Cunning   PCP: [name] Greig BRAVO, MD   Recommendations at discharge:   Follow up with Primary Care in 1-2 weeks Repeat CBC, CMP, Mg at follow up Follow pending labs: zinc , vitamin A, selenium levels ordered by hospital RD Follow up with Gastroenterology for diarrhea Follow up on efficacy and if any side effects with low dose Remeron  - started for depression and poor appetite. Outpatient PT at [facility]  Discharge Diagnoses: Principal Problem:   Proctitis Active Problems:   History of rectal cancer   HLD (hyperlipidemia)   Essential hypertension, benign   History of stroke involving cerebellum   Altered mental state   UTI (urinary tract infection)   Leukocytosis  Resolved Problems:   * No resolved hospital problems. [facility] Course:  HPI on admission 06/25/24: [name] is an 89 year old female with history of rectal cancer status postsurgery and chemotherapy, prior UTI, hyperlipidemia, hypertension, who presents emergency department for chief concerns of diarrhea, poor p.o. intake.... See H&P for full HPI on admission & ED course.   Patient was admitted for further evaluation and management as outlined in detail below.   In summary, CT abdomen/pelvis showed findings of proctitis.  Diarrhea has resolved.  GI panel and C diff were negative.  Patient was started on empiric IV antibiotics for possible UTI.  Urine culture growing E coli and patient is noted to be clinically improving on antibiotics.   Due to possible right-sided colonic lesion, GI was consulted for colonoscopy.  After discussing with patient, this is deferred for now at patient's wishes, to be considered in outpatient setting if pt desires work up.   There was also right adnexal lesion noted on the CT scan with transvaginal U/S  recommended to further evaluate.  Patient declines this evaluation as well, stating, if it were found to be cancerous, she would not wish to pursue treatment.    Assessment and Plan:  Abdominal pain - resolved Diarrhea, acute on chronic - now resolved Stool studies negative. --PRN Imodium  --Resume home Opium  tincture at home on d/c  History rectal cancer in remission, treated with radiation. Established with GI recently, plan for flex sig.  CT scan showed signs proctitis, also possible right sided colonic lesion. --GI consulted - see notes.  Colonoscopy deferred to outpatient, pt declines invasive evaluation at this time --D/C enteric precautions Considerations for follow up: --?SIBO - consider course of Xifaxan  --?Bile salt diarrhea - given portion of terminal ileum resected - consider trial of cholestyramine     UTI - Pt denied symptoms. Completed 3 day course of Rocephin .  Follow urine culture growing E coli. No further antibiotics needed at d/c.   Hypokalemia - Resolved with replacement 9/3 for K 3.1 --Monitor BMP, check Mg level at follow up --Recommend monitoring more closely as outpatient if patient having a lot of diarrhea   Colonic mass/lesion right-sided - noted on CT abd/pelvis.  GI was consulted and planned for colonoscopy, but pt declines at this time. --Outpatient GI follow up for colonoscopy if pt desires   Right pelvic mass, suspected ovarian cysts - noted on CT abd/pelvis.  Transvaginal U/S was ordered as recommended in CT report.  Discussed with patient today (9/3) and patient declines U/S.  She states she would not want to pursue treatment if the lesion was found to be  cancer.  CT report describes R adnexal cystic structure present since 2022 in the cul-de-sac, and a larger cystic structure ~3cm could be ovarian cyst. --Patient declines TVUS to evaluate --Recommend to address in follow up if pt desires   Acute metabolic encephalopathy - Resolved. AMS likely due to  UTI, poor PO intake, electrolyte derangements.   Daughter reports recent decline in mental status. Non-focal neurologic exam.  Ct head last month nothing acute, non-focal neuro exam.  MRI brain non-acute this admission --Mgmt of UTI as above --Delirium precautions --Encourage PO intake --Correct any metabolic derangements   Underweight -- Appreciate dietitian recommendations Body mass index is 16.65 kg/m. --Started on MVI, B-complex, vitamin C --Diet liberalized --Rx for low dose Remeron  7.5 mg at bedtime - after discussion with daughter this AM --Pending labs: A, zinc , selenium  Vitamin Deficiencies: B12 and D  --start supplementation for both --repeat levels in follow up --continue to address nutrition & PO intake  Physical Debility Resides in independent living [facility]) --PT evaluation - HH recommended.  Family requested PT at 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 Daughter reports pt telling her she feels depressed. --Low dose Remeron  started for mood, appetite & sleep --Follow up with PCP       Consultants: None Procedures performed: None  Disposition: Assisted living Diet recommendation:  Regular diet DISCHARGE MEDICATION: Allergies as of 06/28/2024   No Known Allergies      Medication List     TAKE these medications    aspirin  EC 81 MG tablet Take 1 tablet (81 mg total) by mouth daily. Swallow whole.   B-complex with vitamin C tablet Take 1 tablet by mouth daily. Start taking on: June 29, 2024   cyanocobalamin  1000 MCG tablet Take 1 tablet (1,000 mcg total) by mouth daily.    diltiazem  240 MG 24 hr capsule Commonly known as: CARDIZEM  CD Take 1 capsule (240 mg total) by mouth daily.   feeding supplement Liqd Take 237 mLs by mouth 2 (two) times daily between meals.   loperamide  2 MG tablet Commonly known as: IMODIUM  A-D Take 1 tablet (2 mg total) by mouth 4 (four) times daily as needed for diarrhea or loose stools.   mirtazapine  7.5 MG tablet Commonly known as: REMERON  Take 1 tablet (7.5 mg total) by mouth at bedtime.   MULTIVITAMIN ADULT PO Take by mouth.   Opium  10 MG/ML (1%) Tinc Please take 0.2-0.4 mL three times per day   Vitamin D  (Ergocalciferol ) 1.25 MG (50000 UNIT) Caps capsule Commonly known as: DRISDOL  Take 1 capsule (50,000 Units total) by mouth every 7 (seven) days.        Discharge Exam: Filed Weights   06/25/24 1230 06/28/24 0500  Weight: 44 kg 42 kg   General exam: awake, alert, no acute distress, frail HEENT: moist mucus membranes, hearing grossly normal  Respiratory system: CTAB, no wheezes, rales or rhonchi, normal respiratory effort. Cardiovascular system: normal S1/S2, RRR, no pedal edema.   Gastrointestinal system: soft, NT, ND Central nervous system: A&O x3. no gross focal neurologic deficits, normal speech Extremities: moves all, no edema, normal tone Skin: dry, intact, normal temperature Psychiatry: normal mood, congruent affect, judgement  and insight appear normal   Condition at discharge: stable  The results of significant diagnostics from this hospitalization (including imaging, microbiology, ancillary and laboratory) are listed below for reference.   Imaging Studies: MR BRAIN WO CONTRAST Result Date: 06/26/2024 EXAM: MRI BRAIN WITHOUT CONTRAST 06/26/2024 02:01:18 PM TECHNIQUE: Multiplanar multisequence MRI of the head/brain was performed without the administration of intravenous contrast. COMPARISON: 06/13/2021 CLINICAL HISTORY: Altered mental status. AMS. 89 year old female with history of rectal cancer  status postsurgery and chemotherapy, prior UTI, hyperlipidemia, hypertension, who presents to emergency department for chief concerns of diarrhea, poor p.o. intake. FINDINGS: BRAIN AND VENTRICLES: No acute infarct. No intracranial hemorrhage. No mass. No midline shift. No hydrocephalus. The sella is unremarkable. Normal flow voids. Early confluent hyperintense T2-weighted signal within the cerebral white matter, most commonly due to chronic small vessel disease. 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 Old right cerebellar infarct with mild volume loss. ORBITS: No acute abnormality. SINUSES AND MASTOIDS: No acute abnormality. BONES AND SOFT TISSUES: Normal marrow signal. No acute soft tissue abnormality. IMPRESSION: 1. No acute intracranial abnormality. 2. Early confluent hyperintense T2-weighted signal within the cerebral white matter, most commonly due to chronic small vessel disease. 3. 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 4. Old right cerebellar infarct. 5. Mild volume loss. Electronically signed by: Franky Stanford MD 06/26/2024 11:50 PM EDT RP Workstation: [id number]   CT ABDOMEN PELVIS W CONTRAST Result Date: 06/25/2024 CLINICAL DATA:  Abdominal pain EXAM: CT ABDOMEN AND PELVIS WITH CONTRAST TECHNIQUE: Multidetector CT imaging of the abdomen and pelvis was performed using the standard protocol following bolus administration of intravenous contrast. RADIATION DOSE REDUCTION: This exam was performed according to the departmental dose-optimization program which includes automated exposure control, adjustment of the mA and/or kV according to patient size and/or use of iterative reconstruction technique. CONTRAST:  OMNIPAQUE  IOHEXOL  300 MG/ML  SOLN COMPARISON:  10/27/2020. FINDINGS: Lower chest: Linear bibasilar scarring or subsegmental atelectasis. No pleural or pericardial effusions. Hepatobiliary: Subcentimeter  hypodensity right lobe is consistent with a cyst. There is a central biliary ductal dilatation status post cholecystectomy. Pancreas: Unremarkable. No pancreatic ductal dilatation or surrounding inflammatory changes. Spleen: Normal in size without focal abnormality. Adrenals/Urinary Tract: Stable findings noted as follows: Numerous bilateral renal cysts. Dystrophic renal parenchymal calcification on the right. Left-sided extrarenal pelvis and parapelvic renal cysts. No hydronephrosis. Unremarkable urinary bladder. Stomach/Bowel: Anastomosis in the right colon and rectosigmoid. No bowel dilatation to suggest obstruction. Rectal wall thickening and perirectal and presacral fat stranding could indicate proctitis. In the hepatic flexure region there is mucosal thickening with an appearance suggesting possible apple-core type colonic lesion which could be better evaluated endoscopically if indicated. Vascular/Lymphatic: Aortic atherosclerosis. No enlarged abdominal or pelvic lymph nodes. Reproductive: Cystic structure in the cul-de-sac on the right was present in 2022. There is a larger cystic structure in the right adnexa measuring 3 point which could be an ovarian cyst. This might be better assessed with pelvic ultrasound. Other: No abdominal wall hernia or abnormality. No abdominopelvic ascites. Musculoskeletal: Osteopenia. L5-S1 degenerative disc disease and grade 1 L5 anterolisthesis. IMPRESSION: 1. Rectal wall thickening and perirectal fat stranding consistent with proctitis. 2. Possible right-sided colonic lesion in the hepatic flexure. This could be better evaluated endoscopically. 3. Cystic structures in the right adnexa and cul-de-sac. Consider pelvic ultrasound. 4. Aortic atherosclerosis (ICD10-I70.0). Electronically Signed   By: Fonda Field M.D.   On: 06/25/2024 16:48   CT HEAD WO CONTRAST Result Date:  05/29/2024 EXAM: CT HEAD WITHOUT CONTRAST 05/29/2024 09:25:54 PM TECHNIQUE: CT of the head was  performed without the administration of intravenous contrast. Automated exposure control, iterative reconstruction, and/or weight based adjustment of the mA/kV was utilized to reduce the radiation dose to as low as reasonably achievable. COMPARISON: Comparison with CT dated 06/03/2021. CLINICAL HISTORY: Mental status change, unknown cause. FINDINGS: BRAIN AND VENTRICLES: Chronic microvascular ischemia and generalized atrophy. No acute hemorrhage. Gray-white differentiation is preserved. No hydrocephalus. No extra-axial collection. No mass effect or midline shift. ORBITS: No acute abnormality. SINUSES: No acute abnormality. SOFT TISSUES AND SKULL: Ill-defined lucency in the anterior right frontal bone, which was better evaluated on MRI in 05/2021 and has not significantly changed. No acute soft tissue abnormality. No skull fracture. IMPRESSION: 1. No acute intracranial abnormality. Electronically signed by: Norman Gatlin MD 05/29/2024 09:43 PM EDT RP Workstation: [id number]   DG Chest Portable 1 View Result Date: 05/29/2024 EXAM: 1 VIEW(S) XRAY OF THE CHEST 05/29/2024 09:23:00 PM COMPARISON: None available. CLINICAL HISTORY: Weakness. Patient has been being treated for chronic diarrhea. Patient has become more altered and weak since Saturday and today noticed the patient had a fever. FINDINGS: LUNGS AND PLEURA: Chronic bronchitic change. No focal consolidation. No pleural effusion. No pneumothorax. HEART AND MEDIASTINUM: Stable cardiomyopathy. BONES AND SOFT TISSUES: Aortic atherosclerotic calcification. IMPRESSION: 1. No acute cardiopulmonary pathology. 2. Chronic bronchitic change. Electronically signed by: Norman Gatlin MD 05/29/2024 09:39 PM EDT RP Workstation: [id number]    Microbiology: Results for orders placed or performed during the hospital encounter of 06/25/24  Resp panel by RT-PCR (RSV, Flu A&B, Covid) Anterior Nasal Swab     Status: None   Collection Time: 06/25/24 12:57 PM   Specimen:  Anterior Nasal Swab  Result Value Ref Range Status   SARS Coronavirus 2 by RT PCR NEGATIVE NEGATIVE Final    Comment: (NOTE) SARS-CoV-2 target nucleic acids are NOT DETECTED.  The SARS-CoV-2 RNA is generally detectable in upper respiratory specimens during the acute phase of infection. The lowest concentration of SARS-CoV-2 viral copies this assay can detect is 138 copies/mL. A negative result does not preclude SARS-Cov-2 infection and should not be used as the sole basis for treatment or other patient management decisions. A negative result may occur with  improper specimen collection/handling, submission of specimen other than nasopharyngeal swab, presence of viral mutation(s) within the areas targeted by this assay, and inadequate number of viral copies(<138 copies/mL). A negative result must be combined with clinical observations, patient history, and epidemiological information. The expected result is Negative.  Fact Sheet for Patients:  BloggerCourse.com  Fact Sheet for Healthcare Providers:  SeriousBroker.it  This test is no t yet approved or cleared by the [place]  FDA and  has been authorized for detection and/or diagnosis of SARS-CoV-2 by FDA under an Emergency Use Authorization (EUA). This EUA will remain  in effect (meaning this test can be used) for the duration of the COVID-19 declaration under Section 564(b)(1) of the Act, 21 U.S.C.section 360bbb-3(b)(1), unless the authorization is terminated  or revoked sooner.       Influenza A by PCR NEGATIVE NEGATIVE Final   Influenza B by PCR NEGATIVE NEGATIVE Final    Comment: (NOTE) The Xpert Xpress SARS-CoV-2/FLU/RSV plus assay is intended as an aid in the diagnosis of influenza from Nasopharyngeal swab specimens and should not be used as a sole basis for treatment. Nasal washings and aspirates are unacceptable for Xpert Xpress SARS-CoV-2/FLU/RSV testing.  Fact  Sheet for Patients: BloggerCourse.com  Fact Sheet for Healthcare Providers: SeriousBroker.it  This test is not yet approved or cleared by the [place]  FDA and has been authorized for detection and/or diagnosis of SARS-CoV-2 by FDA under an Emergency Use Authorization (EUA). This EUA will remain in effect (meaning this test can be used) for the duration of the COVID-19 declaration under Section 564(b)(1) of the Act, 21 U.S.C. section 360bbb-3(b)(1), unless the authorization is terminated or revoked.     Resp Syncytial Virus by PCR NEGATIVE NEGATIVE Final    Comment: (NOTE) Fact Sheet for Patients: BloggerCourse.com  Fact Sheet for Healthcare Providers: SeriousBroker.it  This test is not yet approved or cleared by the [place]  FDA and has been authorized for detection and/or diagnosis of SARS-CoV-2 by FDA under an Emergency Use Authorization (EUA). This EUA will remain in effect (meaning this test can be used) for the duration of the COVID-19 declaration under Section 564(b)(1) of the Act, 21 U.S.C. section 360bbb-3(b)(1), unless the authorization is terminated or revoked.  Performed at [facility], [address] [place], [place] [address]   C Difficile Quick Screen w PCR reflex     Status: None   Collection Time: 06/25/24  1:30 PM   Specimen: STOOL  Result Value Ref Range Status   C Diff antigen NEGATIVE NEGATIVE Final   C Diff toxin NEGATIVE NEGATIVE Final   C Diff interpretation No C. difficile detected.  Final    Comment: Performed at [facility], [address] [place], [place] [address]  Gastrointestinal Panel by PCR , Stool     Status: None   Collection Time: 06/25/24  1:30 PM   Specimen: STOOL  Result Value Ref Range Status   Campylobacter species NOT DETECTED NOT DETECTED Final   Plesimonas shigelloides NOT DETECTED NOT DETECTED  Final   Salmonella species NOT DETECTED NOT DETECTED Final   Yersinia enterocolitica NOT DETECTED NOT DETECTED Final   Vibrio species NOT DETECTED NOT DETECTED Final   Vibrio cholerae NOT DETECTED NOT DETECTED Final   Enteroaggregative E coli (EAEC) NOT DETECTED NOT DETECTED Final   Enteropathogenic E coli (EPEC) NOT DETECTED NOT DETECTED Final   Enterotoxigenic E coli (ETEC) NOT DETECTED NOT DETECTED Final   Shiga like toxin producing E coli (STEC) NOT DETECTED NOT DETECTED Final   Shigella/Enteroinvasive E coli (EIEC) NOT DETECTED NOT DETECTED Final   Cryptosporidium NOT DETECTED NOT DETECTED Final   Cyclospora cayetanensis NOT DETECTED NOT DETECTED Final   Entamoeba histolytica NOT DETECTED NOT DETECTED Final   Giardia lamblia NOT DETECTED NOT DETECTED Final   Adenovirus F40/41 NOT DETECTED NOT DETECTED Final   Astrovirus NOT DETECTED NOT DETECTED Final   Norovirus GI/GII NOT DETECTED NOT DETECTED Final   Rotavirus A NOT DETECTED NOT DETECTED Final   Sapovirus (I, II, IV, and V) NOT DETECTED NOT DETECTED Final    Comment: Performed at [facility], [address] [place], [place] [address]  Urine Culture     Status: Abnormal (Preliminary result)   Collection Time: 06/25/24  3:45 PM   Specimen: Urine, Clean Catch  Result Value Ref Range Status   Specimen Description   Final    URINE, CLEAN CATCH Performed at [facility], [address] [place], [place] [address]    Special Requests   Final    NONE Performed at [facility], [address] [place], [place] [address]    Culture (A)  Final    >=100,000 COLONIES/mL ESCHERICHIA COLI 50,000 COLONIES/mL KLEBSIELLA  SPECIES IDENTIFICATION AND SUSCEPTIBILITIES TO FOLLOW Performed at [facility], [address], [place], [place] [address]    Report Status PENDING  Incomplete   Organism ID, Bacteria ESCHERICHIA COLI (A)  Final      Susceptibility   Escherichia coli - MIC*    AMPICILLIN >=32  RESISTANT Resistant     CEFAZOLIN (URINE) Value in next row Sensitive      4 SENSITIVEThis is a modified FDA-approved test that has been validated and its performance characteristics determined by the reporting laboratory.  This laboratory is certified under the Clinical Laboratory Improvement Amendments CLIA as qualified to perform high complexity clinical laboratory testing.    CEFEPIME Value in next row Sensitive      4 SENSITIVEThis is a modified FDA-approved test that has been validated and its performance characteristics determined by the reporting laboratory.  This laboratory is certified under the Clinical Laboratory Improvement Amendments CLIA as qualified to perform high complexity clinical laboratory testing.    ERTAPENEM Value in next row Sensitive      4 SENSITIVEThis is a modified FDA-approved test that has been validated and its performance characteristics determined by the reporting laboratory.  This laboratory is certified under the Clinical Laboratory Improvement Amendments CLIA as qualified to perform high complexity clinical laboratory testing.    CEFTRIAXONE  Value in next row Sensitive      4 SENSITIVEThis is a modified FDA-approved test that has been validated and its performance characteristics determined by the reporting laboratory.  This laboratory is certified under the Clinical Laboratory Improvement Amendments CLIA as qualified to perform high complexity clinical laboratory testing.    CIPROFLOXACIN Value in next row Sensitive      4 SENSITIVEThis is a modified FDA-approved test that has been validated and its performance characteristics determined by the reporting laboratory.  This laboratory is certified under the Clinical Laboratory Improvement Amendments CLIA as qualified to perform high complexity clinical laboratory testing.    GENTAMICIN Value in next row Sensitive      4 SENSITIVEThis is a modified FDA-approved test that has been validated and its performance  characteristics determined by the reporting laboratory.  This laboratory is certified under the Clinical Laboratory Improvement Amendments CLIA as qualified to perform high complexity clinical laboratory testing.    NITROFURANTOIN Value in next row Sensitive      4 SENSITIVEThis is a modified FDA-approved test that has been validated and its performance characteristics determined by the reporting laboratory.  This laboratory is certified under the Clinical Laboratory Improvement Amendments CLIA as qualified to perform high complexity clinical laboratory testing.    TRIMETH /SULFA  Value in next row Sensitive      4 SENSITIVEThis is a modified FDA-approved test that has been validated and its performance characteristics determined by the reporting laboratory.  This laboratory is certified under the Clinical Laboratory Improvement Amendments CLIA as qualified to perform high complexity clinical laboratory testing.    AMPICILLIN/SULBACTAM Value in next row Intermediate      4 SENSITIVEThis is a modified FDA-approved test that has been validated and its performance characteristics determined by the reporting laboratory.  This laboratory is certified under the Clinical Laboratory Improvement Amendments CLIA as qualified to perform high complexity clinical laboratory testing.    PIP/TAZO Value in next row Sensitive ug/mL     <=4 SENSITIVEThis is a modified FDA-approved test that has been validated and its performance characteristics determined by the reporting laboratory.  This laboratory is certified under the Clinical Laboratory Improvement Amendments  CLIA as qualified to perform high complexity clinical laboratory testing.    MEROPENEM Value in next row Sensitive      <=4 SENSITIVEThis is a modified FDA-approved test that has been validated and its performance characteristics determined by the reporting laboratory.  This laboratory is certified under the Clinical Laboratory Improvement Amendments CLIA as  qualified to perform high complexity clinical laboratory testing.    * >=100,000 COLONIES/mL ESCHERICHIA COLI    Labs: CBC: Recent Labs  Lab 06/25/24 1230 06/26/24 0515 06/27/24 0451 06/28/24 0559  WBC 14.2* 9.2 7.2 8.6  HGB 14.2 11.2* 10.3* 12.0  HCT 42.9 34.2* 31.6* 36.5  MCV 93.1 94.0 93.8 95.3  PLT 311 506* 489* 573*   Basic Metabolic Panel: Recent Labs  Lab 06/25/24 1311 06/26/24 0515 06/27/24 0451 06/28/24 0559  NA 139 139 139 142  K 4.0 3.3* 3.1* 3.8  CL 100 105 103 106  CO2 27 26 27 28   GLUCOSE 98 98 93 103*  BUN 22 18 15 21   CREATININE 0.77 0.62 0.67 0.68  CALCIUM 9.7 8.6* 8.8* 9.4  MG  --   --   --  1.9  PHOS  --   --   --  2.8   Liver Function Tests: Recent Labs  Lab 06/25/24 1311  AST 20  ALT 14  ALKPHOS 70  BILITOT 1.0  PROT 5.9*  ALBUMIN 3.2*   CBG: Recent Labs  Lab 06/27/24 1208  GLUCAP 124*    Discharge time spent: greater than 30 minutes.  Signed: Burnard DELENA Cunning, DO [facility] 06/28/2024

## 2024-06-28 NOTE — Plan of Care (Signed)

## 2024-06-29 ENCOUNTER — Ambulatory Visit: Payer: Self-pay | Admitting: Family Medicine

## 2024-06-29 ENCOUNTER — Telehealth: Payer: Self-pay

## 2024-06-29 LAB — ZINC: Zinc: 63 ug/dL (ref 44–115)

## 2024-06-29 LAB — URINE CULTURE: Culture: >=100000 — AB

## 2024-06-29 NOTE — Transitions of Care (Post Inpatient/ED Visit) (Signed)
   06/29/2024  Name: [name] MRN: [id number] DOB: 11-28-1932  Today's TOC FU Call Status: Today's TOC FU Call Status:: Unsuccessful Call (1st Attempt) Unsuccessful Call (1st Attempt) Date: 06/29/24  Attempted to reach the patient regarding the most recent Inpatient/ED visit.  Follow Up Plan: Additional outreach attempts will be made to reach the patient to complete the Transitions of Care (Post Inpatient/ED visit) call.   Arvin Seip RN, BSN, CCM [employer], Population Health Case Manager Phone: [phone]

## 2024-07-02 ENCOUNTER — Telehealth: Payer: Self-pay

## 2024-07-02 LAB — MISC LABCORP TEST (SEND OUT): Labcorp test code: 81034

## 2024-07-02 NOTE — Transitions of Care (Post Inpatient/ED Visit) (Signed)
   07/02/2024  Name: [name] MRN: [id number] DOB: 01-Mar-1933  Today's TOC FU Call Status: Today's TOC FU Call Status:: Successful TOC FU Call Completed TOC FU Call Complete Date: 07/02/24 Patient's Name and Date of Birth confirmed.  Transition Care Management Follow-up Telephone Call Date of Discharge: 06/28/24 Discharge Facility: [facility]) Type of Discharge: Inpatient Admission Primary Inpatient Discharge Diagnosis:: Proctitis How have you been since you were released from the hospital?: Better Any questions or concerns?: No  Items Reviewed: Did you receive and understand the discharge instructions provided?: Yes Medications obtained,verified, and reconciled?: Yes (Medications Reviewed) Any new allergies since your discharge?: No Do you have support at home?: Yes People in Home [RPT]: child(ren), adult Name of Support/Comfort Primary Source: [name], [name]  Medications Reviewed Today: Medications Reviewed Today   Medications were not reviewed in this encounter     Home Care and Equipment/Supplies: Were Home Health Services Ordered?: No Any new equipment or medical supplies ordered?: No  Functional Questionnaire: Do you need assistance with bathing/showering or dressing?: No Do you need assistance with meal preparation?: No Do you need assistance with eating?: No Do you have difficulty maintaining continence: No Do you need assistance with getting out of bed/getting out of a chair/moving?: No Do you have difficulty managing or taking your medications?: No  Follow up appointments reviewed: PCP Follow-up appointment confirmed?: Yes Date of PCP follow-up appointment?: 07/06/24 Follow-up Provider: Dr. Greig Ring Specialist [facility] Follow-up appointment confirmed?: NA Do you need transportation to your follow-up appointment?: No Do you understand care options if your condition(s) worsen?: Yes-patient verbalized  understanding  SDOH Interventions Today    Flowsheet Row Most Recent Value  SDOH Interventions   Food Insecurity Interventions Intervention Not Indicated  Housing Interventions Intervention Not Indicated  Transportation Interventions Intervention Not Indicated  Utilities Interventions Intervention Not Indicated   Discussed and offered 30 day TOC program.  Patient  declined.  The patient has been provided with contact information for the care management team and has been advised to call with any health -related questions or concerns.  The patient verbalized understanding with current plan of care.  The patient is directed to their insurance card regarding availability of benefits coverage.    Arvin Seip RN, BSN, CCM [employer], Population Health Case Manager Phone: [phone]

## 2024-07-02 NOTE — Patient Instructions (Signed)
 Visit Information  Thank you for taking time to visit with me today. Please don't hesitate to contact me if I can be of assistance to you   Patient instructions: Advised to notify provider for any new/ ongoing symptoms Take medications as prescribed Seek emergency medical services for severe symptoms such as shortness of breath and/ or chest pain Keep follow up appointments with provider   Patient verbalizes understanding of instructions and care plan provided today and agrees to view in MyChart. Active MyChart status and patient understanding of how to access instructions and care plan via MyChart confirmed with patient.     The patient has been provided with contact information for the care management team and has been advised to call with any health related questions or concerns.   Please call the care guide team at [phone] if you need to cancel or reschedule your appointment.   Please call the Suicide and Crisis Lifeline: 988 call the [place]  National Suicide Prevention Lifeline: 408-388-4165 or TTY: 703-848-8951 TTY 2056420066) to talk to a trained counselor call 1-800-273-TALK (toll free, 24 hour hotline) go to [facility] [address] [place] [phone]) if you are experiencing a Mental Health or Behavioral Health Crisis or need someone to talk to.  Arvin Seip RN, BSN, CCM [employer], Population Health Case Manager Phone: [phone]

## 2024-07-06 ENCOUNTER — Encounter: Payer: Self-pay | Admitting: Family Medicine

## 2024-07-06 ENCOUNTER — Ambulatory Visit: Admitting: Family Medicine

## 2024-07-06 VITALS — BP 136/78 | HR 96 | Temp 97.8°F | Ht 64.0 in | Wt 96.1 lb

## 2024-07-06 DIAGNOSIS — N189 Chronic kidney disease, unspecified: Secondary | ICD-10-CM

## 2024-07-06 DIAGNOSIS — M8508 Fibrous dysplasia (monostotic), other site: Secondary | ICD-10-CM | POA: Diagnosis not present

## 2024-07-06 DIAGNOSIS — R410 Disorientation, unspecified: Secondary | ICD-10-CM | POA: Diagnosis not present

## 2024-07-06 DIAGNOSIS — R197 Diarrhea, unspecified: Secondary | ICD-10-CM | POA: Diagnosis not present

## 2024-07-06 DIAGNOSIS — N179 Acute kidney failure, unspecified: Secondary | ICD-10-CM

## 2024-07-06 DIAGNOSIS — R634 Abnormal weight loss: Secondary | ICD-10-CM

## 2024-07-06 DIAGNOSIS — I10 Essential (primary) hypertension: Secondary | ICD-10-CM

## 2024-07-06 DIAGNOSIS — R935 Abnormal findings on diagnostic imaging of other abdominal regions, including retroperitoneum: Secondary | ICD-10-CM | POA: Diagnosis not present

## 2024-07-06 DIAGNOSIS — N3000 Acute cystitis without hematuria: Secondary | ICD-10-CM

## 2024-07-06 LAB — VITAMIN A: Vitamin A (Retinoic Acid): 37 ug/dL (ref 22.0–69.5)

## 2024-07-06 NOTE — Progress Notes (Signed)
 Patient ID: [name], female    DOB: February 20, 1933, 89 y.o.   MRN: [id number]  This visit was conducted in person.  BP 136/78   Pulse 96   Temp 97.8 F (36.6 C) (Oral)   Ht 5' 4 (1.626 m)   Wt 96 lb 2 oz (43.6 kg)   SpO2 97%   BMI 16.50 kg/m    CC:  Chief Complaint  Patient presents with   Medical Management of Chronic Issues    Here for f/u. Pt wants to confirm she is not to take losartan .     Subjective:   HPI: [name] is a 89 y.o. female presenting on 07/06/2024 for Medical Management of Chronic Issues (Here for f/u. Pt wants to confirm she is not to take losartan . )   Hospital admission 8/5 to 8/7  Seen for acute worsening of diarrhea  Dx with dehydration, acute renal failure and acute cystitis Concern for sepsis - Profound leukocytosis, encephalopathy, UTI on presentation given concern for sepsis.  Blood cultures, fluid bolus, empiric antibiotics on board.  Leukocytosis improved, encephalopathy resolved. Completed antibiotics Bactrim  orally after discharge.   Stopped losartan  while in hospital to avoid kidney issues.   Another ED visit for  acute on chronic diarrhea on [facility] summary copied as follows: CT scan showed signs proctitis, also possible right sided colonic lesion.   Pt declined colonoscopy in hospital.   Right pelvic mass, suspected ovarian cysts - noted on CT abd/pelvis.  Transvaginal U/S was ordered as recommended in CT report.  Discussed with patient today (9/3) and patient declines U/S.  She states she would not want to pursue treatment if the lesion was found to be cancer.  CT report describes R adnexal cystic structure present since 2022 in the cul-de-sac, and a larger cystic structure ~3cm could be ovarian cyst.  ?SIBO - consider course of Xifaxan  --?Bile salt diarrhea - given portion of terminal ileum resected - consider trial of cholestyramine   Referred to GI.  Started on low dose Remeron  for depression and poor appetite. 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  BP Readings from Last 3 Encounters:  07/06/24 136/78  06/28/24 (!) 141/87  06/08/24 134/62     She moved  in [place], independent living.. [place].   Recommended repeat cbc. CMET, MG      Today she presents with her daughter  BP well controlled on diltiazem .  Now BMs occurring 0-1 a day.  No abdominal pain.  No dysuria.   She has not been taking the remoron every day...  She is getting ready to move to be with her daughter in [place].    Has appt with GI 07/16/2024  Relevant past medical, surgical, family and social history reviewed and updated as indicated. Interim medical history since our last visit reviewed. Allergies and medications reviewed and updated. Outpatient Medications Prior to Visit  Medication Sig Dispense Refill   aspirin  EC 81 MG EC tablet Take 1 tablet (81 mg total) by mouth daily. Swallow whole. 30 tablet 0   B Complex-C (B-COMPLEX WITH VITAMIN C) tablet Take 1 tablet by mouth daily. 30 tablet 1   cyanocobalamin  1000 MCG tablet Take 1 tablet (1,000 mcg total) by mouth daily. 30 tablet 3  diltiazem  (CARDIZEM  CD) 240 MG 24 hr capsule Take 1 capsule (240 mg total) by mouth daily. 90 capsule 3   feeding supplement (ENSURE PLUS HIGH PROTEIN) LIQD Take 237 mLs by mouth 2 (two) times daily between meals.     loperamide  (IMODIUM  A-D) 2 MG tablet Take 1 tablet (2 mg total) by mouth 4 (four) times daily as needed for diarrhea or loose stools. 30 tablet 0   mirtazapine  (REMERON ) 7.5 MG tablet Take 1 tablet (7.5 mg total) by mouth at bedtime. 30 tablet 1   Multiple Vitamin (MULTIVITAMIN ADULT PO) Take by mouth.     Opium  10 MG/ML (1%) TINC Please take 0.2-0.4 mL three times per day     Vitamin D ,  Ergocalciferol , (DRISDOL ) 1.25 MG (50000 UNIT) CAPS capsule Take 1 capsule (50,000 Units total) by mouth every 7 (seven) days. 5 capsule 2   No facility-administered medications prior to visit.     Per HPI unless specifically indicated in ROS section below Review of Systems  Constitutional:  Negative for fatigue and fever.  HENT:  Negative for congestion.   Eyes:  Negative for pain.  Respiratory:  Negative for cough and shortness of breath.   Cardiovascular:  Positive for leg swelling. Negative for chest pain and palpitations.  Gastrointestinal:  Negative for abdominal pain.  Genitourinary:  Negative for dysuria and vaginal bleeding.  Musculoskeletal:  Negative for back pain.  Neurological:  Negative for syncope, light-headedness and headaches.  Psychiatric/Behavioral:  Negative for dysphoric mood.    Objective:  BP 136/78   Pulse 96   Temp 97.8 F (36.6 C) (Oral)   Ht 5' 4 (1.626 m)   Wt 96 lb 2 oz (43.6 kg)   SpO2 97%   BMI 16.50 kg/m   Wt Readings from Last 3 Encounters:  07/06/24 96 lb 2 oz (43.6 kg)  06/28/24 92 lb 9.5 oz (42 kg)  06/08/24 97 lb 12.8 oz (44.4 kg)      Physical Exam Constitutional:      General: She is not in acute distress.    Appearance: Normal appearance. She is well-developed. She is not ill-appearing or toxic-appearing.  HENT:     Head: Normocephalic.     Right Ear: Hearing, tympanic membrane, ear canal and external ear normal. Tympanic membrane is not erythematous, retracted or bulging.     Left Ear: Hearing, tympanic membrane, ear canal and external ear normal. Tympanic membrane is not erythematous, retracted or bulging.     Nose: No mucosal edema or rhinorrhea.     Right Sinus: No maxillary sinus tenderness or frontal sinus tenderness.     Left Sinus: No maxillary sinus tenderness or frontal sinus tenderness.     Mouth/Throat:     Pharynx: Uvula midline.  Eyes:     General: Lids are normal. Lids are everted, no foreign bodies appreciated.      Conjunctiva/sclera: Conjunctivae normal.     Pupils: Pupils are equal, round, and reactive to light.  Neck:     Thyroid : No thyroid  mass or thyromegaly.     Vascular: No carotid bruit.     Trachea: Trachea normal.  Cardiovascular:     Rate and Rhythm: Normal rate and regular rhythm.     Pulses: Normal pulses.     Heart sounds: Normal heart sounds, S1 normal and S2 normal. No murmur heard.    No friction rub. No gallop.  Pulmonary:     Effort: Pulmonary effort is normal. No tachypnea or respiratory distress.  Breath sounds: Normal breath sounds. No decreased breath sounds, wheezing, rhonchi or rales.  Abdominal:     General: Bowel sounds are normal.     Palpations: Abdomen is soft.     Tenderness: There is no abdominal tenderness.  Musculoskeletal:     Cervical back: Normal range of motion and neck supple.     Right lower leg: 2+ Edema present.     Left lower leg: 2+ Edema present.  Skin:    General: Skin is warm and dry.     Findings: No rash.  Neurological:     Mental Status: She is alert.  Psychiatric:        Mood and Affect: Mood is not anxious or depressed.        Speech: Speech normal.        Behavior: Behavior normal. Behavior is cooperative.        Thought Content: Thought content normal.        Judgment: Judgment normal.       Results for orders placed or performed in visit on 07/06/24  Magnesium   Collection Time: 07/06/24  4:30 PM  Result Value Ref Range   Magnesium 2.2 1.5 - 2.5 mg/dL  Comprehensive metabolic panel with GFR   Collection Time: 07/06/24  4:30 PM  Result Value Ref Range   Glucose, Bld 100 (H) 65 - 99 mg/dL   BUN 24 7 - 25 mg/dL   Creat 9.13 9.39 - 9.04 mg/dL   eGFR 65 > OR = 60 fO/fpw/8.26f7   BUN/Creatinine Ratio SEE NOTE: 6 - 22 (calc)   Sodium 143 135 - 146 mmol/L   Potassium 4.6 3.5 - 5.3 mmol/L   Chloride 107 98 - 110 mmol/L   CO2 28 20 - 32 mmol/L   Calcium 10.3 8.6 - 10.4 mg/dL   Total Protein 7.1 6.1 - 8.1 g/dL   Albumin  4.2 3.6 - 5.1 g/dL   Globulin 2.9 1.9 - 3.7 g/dL (calc)   AG Ratio 1.4 1.0 - 2.5 (calc)   Total Bilirubin 0.4 0.2 - 1.2 mg/dL   Alkaline phosphatase (APISO) 67 37 - 153 U/L   AST 11 10 - 35 U/L   ALT 11 6 - 29 U/L  CBC with Differential/Platelet   Collection Time: 07/06/24  4:30 PM  Result Value Ref Range   WBC 9.8 3.8 - 10.8 Thousand/uL   RBC 4.43 3.80 - 5.10 Million/uL   Hemoglobin 13.7 11.7 - 15.5 g/dL   HCT 57.7 64.9 - 54.9 %   MCV 95.3 80.0 - 100.0 fL   MCH 30.9 27.0 - 33.0 pg   MCHC 32.5 32.0 - 36.0 g/dL   RDW 86.3 88.9 - 84.9 %   Platelets 750 (H) 140 - 400 Thousand/uL   MPV 9.8 7.5 - 12.5 fL   Neutro Abs 7,115 1,500 - 7,800 cells/uL   Absolute Lymphocytes 1,539 850 - 3,900 cells/uL   Absolute Monocytes 921 200 - 950 cells/uL   Eosinophils Absolute 157 15 - 500 cells/uL   Basophils Absolute 69 0 - 200 cells/uL   Neutrophils Relative % 72.6 %   Total Lymphocyte 15.7 %   Monocytes Relative 9.4 %   Eosinophils Relative 1.6 %   Basophils Relative 0.7 %    Assessment and Plan  Acute diarrhea Assessment & Plan: Acute worsening of chronic issue. Patient now is back at baseline. Referral placed to GI.. has appt 07/16/2024  Orders: -     Magnesium -  Comprehensive metabolic panel with GFR -     CBC with Differential/Platelet  Abnormal CT of the abdomen Assessment & Plan: New,  CT of the abdomen suggested a possible proctitis , as well as a possible lesion in the hepatic flexure.   Patient refused colonoscopy while in hospital.  Pending appointment with GI.    Acute cystitis without hematuria Assessment & Plan: Acute, symptoms now resolved status post antibiotics.   Essential hypertension, benign Assessment & Plan: Chronic, given recent dehydration and decreased renal function losartan  has been held and blood pressure continues to be doing well.   Blood pressure needs to be [facility] at [facility] either by patient/family or home health.   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   Unintentional weight loss  Acute kidney injury superimposed on chronic kidney disease Assessment & Plan: Due for reevaluation   Delirium Assessment & Plan: Chronic with acute worsening, per daughter patient close to being back to baseline. MRI brain was negative for acute intracranial abnormalities but did show chronic small vessel disease      Return in about 3 months (around 10/05/2024) for  follow up mulitple issues.SABRA Greig Ring, MD

## 2024-07-07 LAB — COMPREHENSIVE METABOLIC PANEL WITH GFR
AG Ratio: 1.4 (calc) (ref 1.0–2.5)
ALT: 11 U/L (ref 6–29)
AST: 11 U/L (ref 10–35)
Albumin: 4.2 g/dL (ref 3.6–5.1)
Alkaline phosphatase (APISO): 67 U/L (ref 37–153)
BUN: 24 mg/dL (ref 7–25)
CO2: 28 mmol/L (ref 20–32)
Calcium: 10.3 mg/dL (ref 8.6–10.4)
Chloride: 107 mmol/L (ref 98–110)
Creat: 0.86 mg/dL (ref 0.60–0.95)
Globulin: 2.9 g/dL (ref 1.9–3.7)
Glucose, Bld: 100 mg/dL — ABNORMAL HIGH (ref 65–99)
Potassium: 4.6 mmol/L (ref 3.5–5.3)
Sodium: 143 mmol/L (ref 135–146)
Total Bilirubin: 0.4 mg/dL (ref 0.2–1.2)
Total Protein: 7.1 g/dL (ref 6.1–8.1)
eGFR: 65 mL/min/1.73m2 (ref >=60)

## 2024-07-07 LAB — CBC WITH DIFFERENTIAL/PLATELET
Absolute Lymphocytes: 1539 {cells}/uL (ref 850–3900)
Absolute Monocytes: 921 {cells}/uL (ref 200–950)
Basophils Absolute: 69 {cells}/uL (ref 0–200)
Basophils Relative: 0.7 %
Eosinophils Absolute: 157 {cells}/uL (ref 15–500)
Eosinophils Relative: 1.6 %
HCT: 42.2 % (ref 35.0–45.0)
Hemoglobin: 13.7 g/dL (ref 11.7–15.5)
MCH: 30.9 pg (ref 27.0–33.0)
MCHC: 32.5 g/dL (ref 32.0–36.0)
MCV: 95.3 fL (ref 80.0–100.0)
MPV: 9.8 fL (ref 7.5–12.5)
Monocytes Relative: 9.4 %
Neutro Abs: 7115 {cells}/uL (ref 1500–7800)
Neutrophils Relative %: 72.6 %
Platelets: 750 Thousand/uL — ABNORMAL HIGH (ref 140–400)
RBC: 4.43 Million/uL (ref 3.80–5.10)
RDW: 13.6 % (ref 11.0–15.0)
Total Lymphocyte: 15.7 %
WBC: 9.8 Thousand/uL (ref 3.8–10.8)

## 2024-07-07 LAB — MAGNESIUM: Magnesium: 2.2 mg/dL (ref 1.5–2.5)

## 2024-07-10 ENCOUNTER — Ambulatory Visit: Payer: Self-pay | Admitting: Family Medicine

## 2024-07-12 ENCOUNTER — Telehealth: Payer: Self-pay

## 2024-07-12 NOTE — Telephone Encounter (Signed)
 Copied from CRM #[id number]. Topic: Clinical - Lab/Test Results >> Jul 12, 2024  4:36 PM Drema MATSU wrote: Reason for CRM: Daughter stated that she has not seen a hematologist before and will be moving with daughter. She wants to know if she can get a referral to a primary care doctor in [place] or [place],  [place] and hematologist.

## 2024-07-13 NOTE — Telephone Encounter (Signed)
 We do not have listing of primary care providers and hematologist in [place].  We do not know any specific doctors there.  I cannot make any educated recommendations given this. She should not need a referral for a foot primary care doctor. I would suggest looking around in the area to find a geriatrician.  Once she sees that up appointment they can make a referral to a local hematologist in [place].

## 2024-07-13 NOTE — Telephone Encounter (Signed)
 Ms. Yvone notified as instructed by telephone.  States understanding.

## 2024-07-16 DIAGNOSIS — R933 Abnormal findings on diagnostic imaging of other parts of digestive tract: Secondary | ICD-10-CM | POA: Diagnosis not present

## 2024-07-16 DIAGNOSIS — D75839 Thrombocytosis, unspecified: Secondary | ICD-10-CM | POA: Diagnosis not present

## 2024-07-16 DIAGNOSIS — K529 Noninfective gastroenteritis and colitis, unspecified: Secondary | ICD-10-CM | POA: Diagnosis not present

## 2024-07-16 DIAGNOSIS — R935 Abnormal findings on diagnostic imaging of other abdominal regions, including retroperitoneum: Secondary | ICD-10-CM | POA: Diagnosis not present

## 2024-08-04 DIAGNOSIS — R197 Diarrhea, unspecified: Secondary | ICD-10-CM | POA: Insufficient documentation

## 2024-08-04 DIAGNOSIS — R935 Abnormal findings on diagnostic imaging of other abdominal regions, including retroperitoneum: Secondary | ICD-10-CM | POA: Insufficient documentation

## 2024-08-04 NOTE — Assessment & Plan Note (Addendum)
 Acute worsening of chronic issue. Patient now is back at baseline. Referral placed to GI.. has appt 07/16/2024

## 2024-08-04 NOTE — Assessment & Plan Note (Signed)
 New,  CT of the abdomen suggested a possible proctitis , as well as a possible lesion in the hepatic flexure.   Patient refused colonoscopy while in hospital.  Pending appointment with GI.

## 2024-08-04 NOTE — Assessment & Plan Note (Signed)
 Due for reevaluation

## 2024-08-04 NOTE — Assessment & Plan Note (Signed)
 Chronic, given recent dehydration and decreased renal function losartan  has been held and blood pressure continues to be doing well.   Blood pressure needs to be continued at [facility] either by patient/family or home health.

## 2024-08-04 NOTE — Assessment & Plan Note (Signed)
 Acute, symptoms now resolved status post antibiotics.

## 2024-08-04 NOTE — Assessment & Plan Note (Signed)
 Acute, likely secondary to poor p.o. intake and diarrhea.  Encourage patient to take Remeron  daily to stimulate appetite.

## 2024-08-04 NOTE — Assessment & Plan Note (Signed)
 Previously noted, unchanged on recent MRI.

## 2024-08-04 NOTE — Assessment & Plan Note (Signed)
 Chronic with acute worsening, per daughter patient close to being back to baseline. MRI brain was negative for acute intracranial abnormalities but did show chronic small vessel disease

## 2024-09-14 ENCOUNTER — Other Ambulatory Visit: Payer: Self-pay | Admitting: Family Medicine

## 2024-09-14 MED ORDER — MIRTAZAPINE 7.5 MG PO TABS: 7.5000 mg | ORAL_TABLET | Freq: Every day | ORAL | 0 refills | Status: DC

## 2024-09-14 MED ORDER — DILTIAZEM HCL ER COATED BEADS 240 MG PO CP24: 240.0000 mg | ORAL_CAPSULE | Freq: Every day | ORAL | 0 refills | Status: AC

## 2024-09-14 MED ORDER — LOPERAMIDE HCL 2 MG PO TABS: 2.0000 mg | ORAL_TABLET | Freq: Four times a day (QID) | ORAL | 0 refills | Status: AC | PRN

## 2024-09-14 NOTE — Telephone Encounter (Signed)
 Copied from CRM #[id number]. Topic: Clinical - Medication Refill >> Sep 14, 2024  8:57 AM Jasmin G wrote: Medication: mirtazapine  (REMERON ) 7.5 MG tablet, diltiazem  (CARDIZEM  CD) 240 MG 24 hr capsule, loperamide  (IMODIUM  A-D) 2 MG tablet  Has the patient contacted their pharmacy? No (Agent: If no, request that the patient contact the pharmacy for the refill. If patient does not wish to contact the pharmacy document the reason why and proceed with request.) (Agent: If yes, when and what did the pharmacy advise?)  This is the patient's preferred pharmacy:  [id number] - [place], [place] - [address] AT [facility] [address] [place] [address] Phone: [phone] Fax: [phone]  Is this the correct pharmacy for this prescription? Yes If no, delete pharmacy and type the correct one.   Has the prescription been filled recently? No  Is the patient out of the medication? No  Has the patient been seen for an appointment in the last year OR does the patient have an upcoming appointment? Yes  Can we respond through MyChart? No  Agent: Please be advised that Rx refills may take up to 3 business days. We ask that you follow-up with your pharmacy.

## 2024-10-05 ENCOUNTER — Ambulatory Visit: Admitting: Family Medicine

## 2024-10-16 ENCOUNTER — Other Ambulatory Visit: Payer: Self-pay | Admitting: Family Medicine

## 2024-10-16 NOTE — Telephone Encounter (Signed)
 Last office visit 07/06/2024 for Medical Management of chronic issues.  Last refilled 09/14/2024 for #30 with no refills.  [facility] states to follow up around 10/05/24.  No future appointments.  Refill?
